# Patient Record
Sex: Female | Born: 1955 | Race: White | Hispanic: No | Marital: Single | Smoking: Former smoker
Health system: Southern US, Community
[De-identification: ages and names within clinical notes are randomized; demographics above are authoritative.]

## PROBLEM LIST (undated history)

## (undated) DIAGNOSIS — C801 Malignant (primary) neoplasm, unspecified: Secondary | ICD-10-CM

## (undated) DIAGNOSIS — Z923 Personal history of irradiation: Secondary | ICD-10-CM

## (undated) DIAGNOSIS — M810 Age-related osteoporosis without current pathological fracture: Secondary | ICD-10-CM

## (undated) DIAGNOSIS — F410 Panic disorder [episodic paroxysmal anxiety] without agoraphobia: Secondary | ICD-10-CM

## (undated) DIAGNOSIS — M1990 Unspecified osteoarthritis, unspecified site: Secondary | ICD-10-CM

## (undated) DIAGNOSIS — E7800 Pure hypercholesterolemia, unspecified: Secondary | ICD-10-CM

## (undated) HISTORY — DX: Age-related osteoporosis without current pathological fracture: M81.0

## (undated) HISTORY — PX: RHINOPLASTY: SUR1284

## (undated) HISTORY — DX: Panic disorder (episodic paroxysmal anxiety): F41.0

## (undated) HISTORY — PX: MANDIBLE FRACTURE SURGERY: SHX706

## (undated) HISTORY — DX: Pure hypercholesterolemia, unspecified: E78.00

## (undated) HISTORY — PX: MASTECTOMY: SHX3

---

## 2009-03-13 ENCOUNTER — Encounter (INDEPENDENT_AMBULATORY_CARE_PROVIDER_SITE_OTHER): Payer: Self-pay | Admitting: *Deleted

## 2009-04-24 ENCOUNTER — Ambulatory Visit: Payer: Self-pay | Admitting: Internal Medicine

## 2009-04-24 DIAGNOSIS — F411 Generalized anxiety disorder: Secondary | ICD-10-CM | POA: Insufficient documentation

## 2009-04-24 DIAGNOSIS — Z9889 Other specified postprocedural states: Secondary | ICD-10-CM | POA: Insufficient documentation

## 2009-04-24 DIAGNOSIS — N959 Unspecified menopausal and perimenopausal disorder: Secondary | ICD-10-CM | POA: Insufficient documentation

## 2009-04-24 DIAGNOSIS — R5383 Other fatigue: Secondary | ICD-10-CM

## 2009-04-24 DIAGNOSIS — R5381 Other malaise: Secondary | ICD-10-CM | POA: Insufficient documentation

## 2009-04-24 DIAGNOSIS — S02650A Fracture of angle of mandible, unspecified side, initial encounter for closed fracture: Secondary | ICD-10-CM | POA: Insufficient documentation

## 2009-04-24 DIAGNOSIS — J309 Allergic rhinitis, unspecified: Secondary | ICD-10-CM | POA: Insufficient documentation

## 2009-04-24 DIAGNOSIS — M2550 Pain in unspecified joint: Secondary | ICD-10-CM | POA: Insufficient documentation

## 2009-04-24 DIAGNOSIS — IMO0001 Reserved for inherently not codable concepts without codable children: Secondary | ICD-10-CM | POA: Insufficient documentation

## 2009-04-28 LAB — CONVERTED CEMR LAB: Vit D, 25-Hydroxy: 50 ng/mL (ref 30–89)

## 2009-05-01 ENCOUNTER — Encounter (INDEPENDENT_AMBULATORY_CARE_PROVIDER_SITE_OTHER): Payer: Self-pay | Admitting: *Deleted

## 2009-05-02 LAB — CONVERTED CEMR LAB
BUN: 16 mg/dL (ref 6–23)
Basophils Absolute: 0 10*3/uL (ref 0.0–0.1)
Basophils Relative: 0 % (ref 0.0–3.0)
CO2: 31 meq/L (ref 19–32)
Calcium: 9.4 mg/dL (ref 8.4–10.5)
Chloride: 106 meq/L (ref 96–112)
Creatinine, Ser: 1 mg/dL (ref 0.4–1.2)
Eosinophils Absolute: 0.1 10*3/uL (ref 0.0–0.7)
Eosinophils Relative: 3.1 % (ref 0.0–5.0)
Free T4: 0.7 ng/dL (ref 0.6–1.6)
GFR calc non Af Amer: 61.9 mL/min (ref >60)
Glucose, Bld: 85 mg/dL (ref 70–99)
HCT: 35.8 % — ABNORMAL LOW (ref 36.0–46.0)
Hemoglobin: 12.1 g/dL (ref 12.0–15.0)
Lymphocytes Relative: 25 % (ref 12.0–46.0)
Lymphs Abs: 1.1 10*3/uL (ref 0.7–4.0)
MCHC: 33.8 g/dL (ref 30.0–36.0)
MCV: 94.4 fL (ref 78.0–100.0)
Monocytes Absolute: 0.2 10*3/uL (ref 0.1–1.0)
Monocytes Relative: 5.4 % (ref 3.0–12.0)
Neutro Abs: 3 10*3/uL (ref 1.4–7.7)
Neutrophils Relative %: 66.5 % (ref 43.0–77.0)
Platelets: 211 10*3/uL (ref 150.0–400.0)
Potassium: 4.6 meq/L (ref 3.5–5.1)
RBC: 3.79 M/uL — ABNORMAL LOW (ref 3.87–5.11)
RDW: 12.4 % (ref 11.5–14.6)
Rhuematoid fact SerPl-aCnc: <20 intl units/mL (ref 0.0–20.0)
Sed Rate: 17 mm/hr (ref 0–22)
Sodium: 144 meq/L (ref 135–145)
TSH: 2.88 microintl units/mL (ref 0.35–5.50)
Total CK: 57 units/L (ref 7–177)
Uric Acid, Serum: 4.5 mg/dL (ref 2.4–7.0)
WBC: 4.4 10*3/uL — ABNORMAL LOW (ref 4.5–10.5)

## 2009-05-03 ENCOUNTER — Encounter (INDEPENDENT_AMBULATORY_CARE_PROVIDER_SITE_OTHER): Payer: Self-pay | Admitting: *Deleted

## 2009-05-03 ENCOUNTER — Telehealth (INDEPENDENT_AMBULATORY_CARE_PROVIDER_SITE_OTHER): Payer: Self-pay | Admitting: *Deleted

## 2009-05-08 ENCOUNTER — Encounter: Payer: Self-pay | Admitting: Internal Medicine

## 2009-05-08 ENCOUNTER — Ambulatory Visit (HOSPITAL_COMMUNITY): Admission: RE | Admit: 2009-05-08 | Discharge: 2009-05-08 | Payer: Self-pay | Admitting: Internal Medicine

## 2009-05-08 IMAGING — MG MM DIGITAL SCREENING BILAT
4 series · 4 of 4 positions shown · non-contrast
Comparison: none

DG SCREEN MAMMOGRAM BILATERAL
Bilateral CC and MLO view(s) were taken.
Technologist: KAREL-LUCIE, RT RM

DIGITAL SCREENING MAMMOGRAM WITH CAD
The breast tissue is extremely dense.  No masses or malignant type calcifications are identified.
Images were processed with CAD.

[R CC]
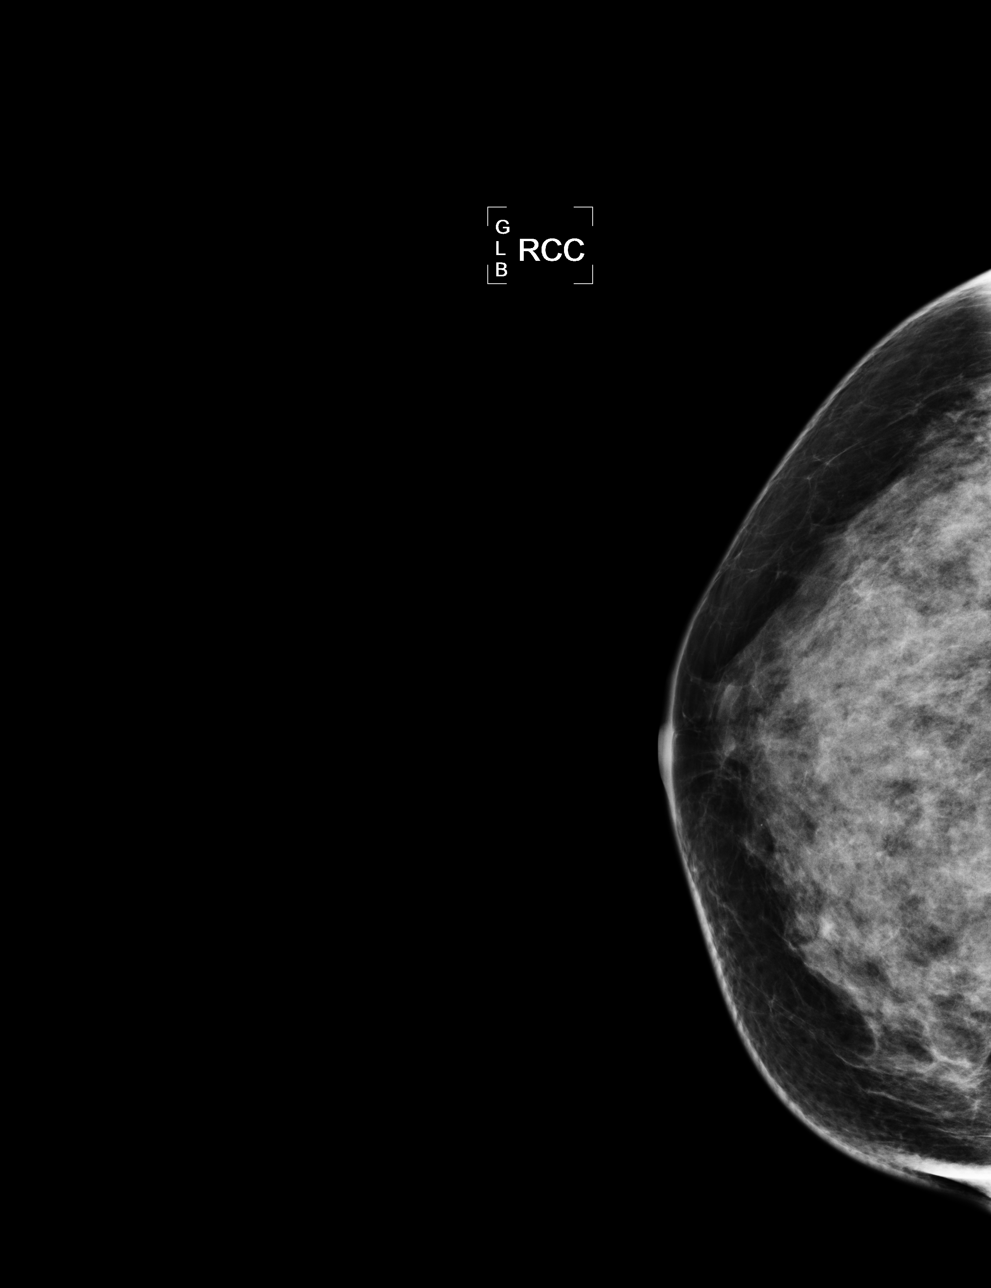

[R MLO]
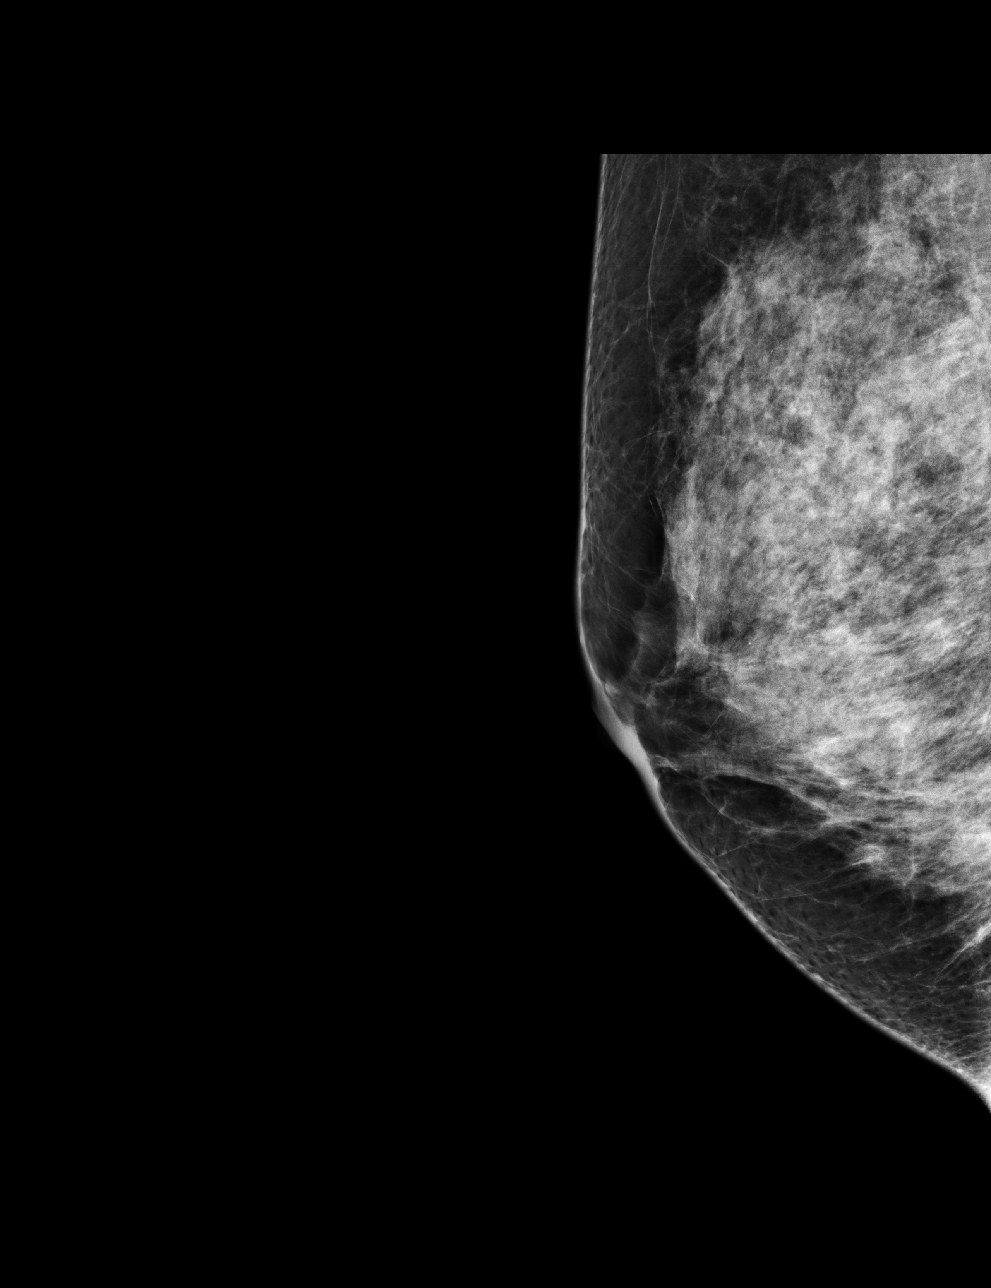

[L CC]
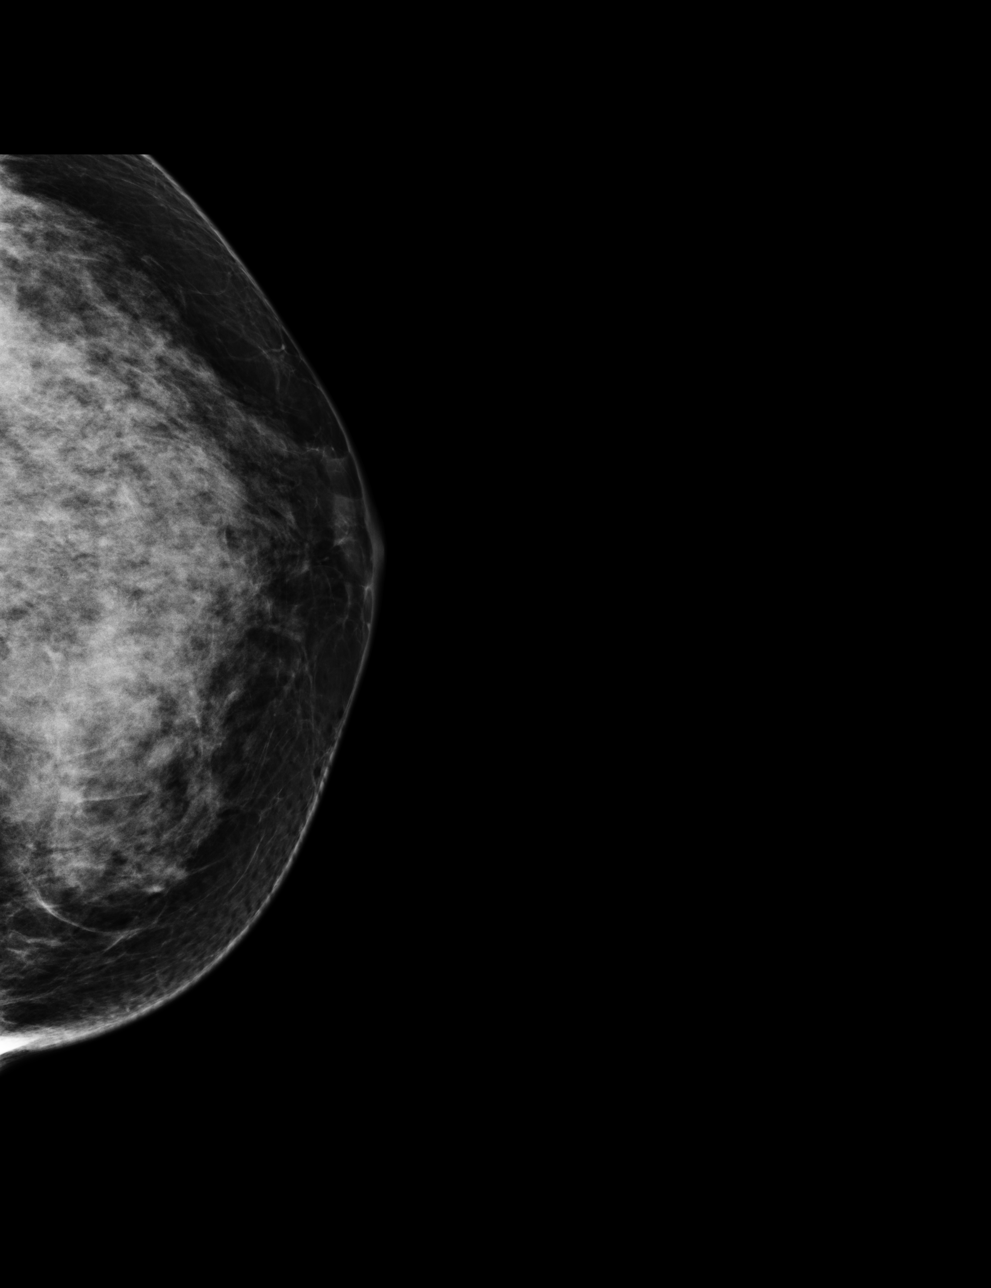

[L MLO]
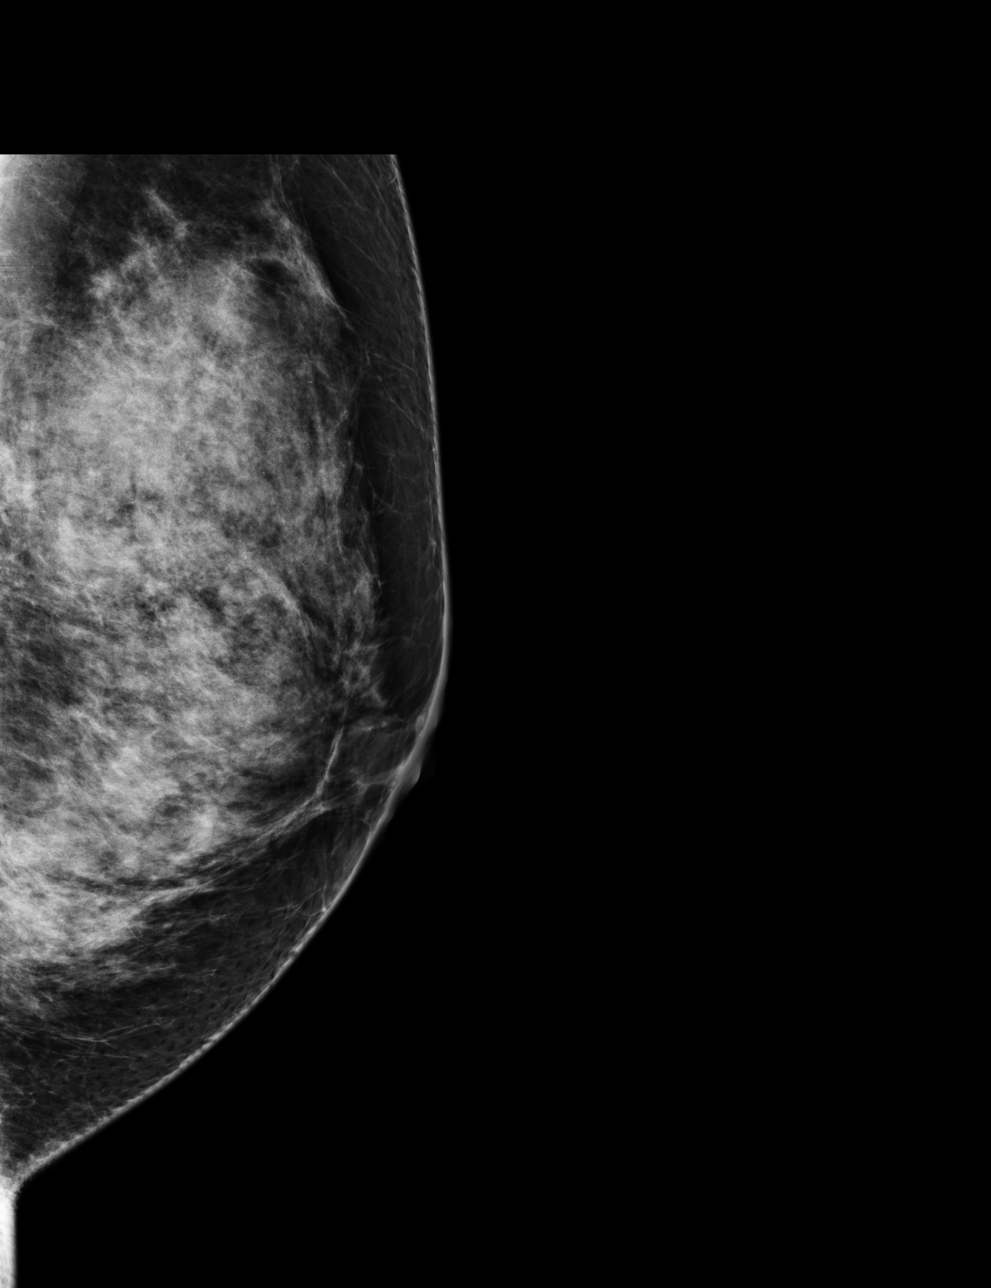

[4 of 4 positions shown; findings below may reference images not displayed]

IMPRESSION: No specific mammographic evidence of malignancy.  Next screening mammogram is recommended in one 
year.

A result letter of this screening mammogram will be mailed directly to the patient.

ASSESSMENT: Negative - BI-RADS 1

Screening mammogram in 1 year.
ANALYZED BY COMPUTER AIDED DETECTION. , THIS PROCEDURE WAS A DIGITAL MAMMOGRAM.

## 2009-05-14 ENCOUNTER — Ambulatory Visit: Payer: Self-pay | Admitting: Internal Medicine

## 2009-05-14 ENCOUNTER — Telehealth: Payer: Self-pay | Admitting: Gastroenterology

## 2009-05-14 ENCOUNTER — Telehealth (INDEPENDENT_AMBULATORY_CARE_PROVIDER_SITE_OTHER): Payer: Self-pay | Admitting: *Deleted

## 2009-05-14 DIAGNOSIS — K625 Hemorrhage of anus and rectum: Secondary | ICD-10-CM | POA: Insufficient documentation

## 2009-05-14 LAB — CONVERTED CEMR LAB
Basophils Absolute: 0.1 10*3/uL (ref 0.0–0.1)
Basophils Relative: 0.9 % (ref 0.0–3.0)
Eosinophils Absolute: 0.1 10*3/uL (ref 0.0–0.7)
Eosinophils Relative: 2.2 % (ref 0.0–5.0)
HCT: 38.7 % (ref 36.0–46.0)
Hemoglobin: 13.4 g/dL (ref 12.0–15.0)
Lymphocytes Relative: 23.6 % (ref 12.0–46.0)
Lymphs Abs: 1.4 10*3/uL (ref 0.7–4.0)
MCHC: 34.7 g/dL (ref 30.0–36.0)
MCV: 93 fL (ref 78.0–100.0)
Monocytes Absolute: 0.3 10*3/uL (ref 0.1–1.0)
Monocytes Relative: 5.7 % (ref 3.0–12.0)
Neutro Abs: 4.2 10*3/uL (ref 1.4–7.7)
Neutrophils Relative %: 67.6 % (ref 43.0–77.0)
Platelets: 217 10*3/uL (ref 150.0–400.0)
RBC: 4.15 M/uL (ref 3.87–5.11)
RDW: 12.2 % (ref 11.5–14.6)
WBC: 6.1 10*3/uL (ref 4.5–10.5)

## 2009-05-15 ENCOUNTER — Telehealth: Payer: Self-pay | Admitting: Internal Medicine

## 2009-05-21 ENCOUNTER — Encounter: Payer: Self-pay | Admitting: Gastroenterology

## 2009-05-28 ENCOUNTER — Telehealth: Payer: Self-pay | Admitting: Physician Assistant

## 2009-05-29 ENCOUNTER — Ambulatory Visit: Payer: Self-pay | Admitting: Gastroenterology

## 2010-10-17 ENCOUNTER — Ambulatory Visit
Admission: RE | Admit: 2010-10-17 | Discharge: 2010-10-17 | Payer: Self-pay | Source: Home / Self Care | Attending: Internal Medicine | Admitting: Internal Medicine

## 2010-10-17 DIAGNOSIS — J019 Acute sinusitis, unspecified: Secondary | ICD-10-CM | POA: Insufficient documentation

## 2010-10-17 LAB — CONVERTED CEMR LAB: Rapid Strep: NEGATIVE

## 2010-11-21 NOTE — Assessment & Plan Note (Signed)
Summary: FOR SORE THROAT//PH   Vital Signs:  Patient profile:   55 year old female Weight:      139.2 pounds BMI:     22.04 Temp:     98.4 degrees F oral Pulse rate:   76 / minute Resp:     15 per minute BP sitting:   116 / 78  (left arm) Cuff size:   regular  Vitals Entered By: Shonna Chock CMA (October 17, 2010 9:07 AM) CC: Sore throat since Tuesday, URI symptoms   Primary Care Provider:  Marga Melnick, MD  CC:  Sore throat since Tuesday and URI symptoms.  History of Present Illness:      This is a 55 year old woman who presents with RTI  symptoms; onset 12/24 as scratchy throat.  The patient  now reports nasal congestion, scant purulent nasal discharge, sore throat, and productive cough, but denies earache.  The patient denies fever, dyspnea, and wheezing.  The patient also reports sneezing.  The patient denies itchy watery eyes and headache.  She has chronic  tooth pain, / from clenching teeth.  The patient denies the following risk factors for Strep sinusitis: unilateral facial pain and tender adenopathy.  Rx: Alka Seltzer Plus, Chloraseptic , Theraflu.  Current Medications (verified): 1)  Xanax 0.5 Mg Tabs (Alprazolam) .... 1/2-1 By Mouth Two Times A Day As Needed 2)  Flexeril 10 Mg Tabs (Cyclobenzaprine Hcl) .Marland Kitchen.. 1 By Mouth As Needed 3)  Multivitamins  Tabs (Multiple Vitamin) .... 2 By Mouth Once Daily 4)  Calcium Chew .... 1 By Mouth Three Times A Day 5)  Vit D .... 2 By Mouth Once Daily 6)  Glucosamine-Liquid .... Once Daily As Needed 7)  Benadryl 50 Mg/ml Soln (Diphenhydramine Hcl) .... As Needed 8)  Omega-3 Cf 1000 Mg Caps (Omega-3 Fatty Acids) .Marland Kitchen.. 1 By Mouth Once Daily  Allergies: 1)  ! Iodine  Physical Exam  General:  Thin,in no acute distress; alert,appropriate and cooperative throughout examination Ears:  External ear exam shows no significant lesions or deformities.  Otoscopic examination reveals clear canals, tympanic membranes are intact bilaterally  without bulging, retraction, inflammation or discharge. Hearing is grossly normal bilaterally. Nose:  External nasal examination shows no deformity or inflammation. Nasal mucosa are  dry without lesions or exudates. Profound hyponasal speech Mouth:  Oral mucosa and oropharynx without lesions or exudates.  Teeth in good repair. Mild pharyngeal erythema.  Very hoares Lungs:  Normal respiratory effort, chest expands symmetrically. Lungs are clear to auscultation, no crackles or wheezes. Decreased BS w/o increased WOB Heart:  Normal rate and regular rhythm. S1 and S2 normal without gallop, murmur, click, rub .S4 with slurring Cervical Nodes:  No lymphadenopathy noted Axillary Nodes:  No palpable lymphadenopathy   Impression & Recommendations:  Problem # 1:  PHARYNGITIS-ACUTE (ICD-462)  Her updated medication list for this problem includes:    Amoxicillin 250 Mg/29ml Susr (Amoxicillin) .Marland KitchenMarland KitchenMarland KitchenMarland Kitchen 10 cc three times a day  Problem # 2:  BRONCHITIS-ACUTE (ICD-466.0)  Her updated medication list for this problem includes:    Amoxicillin 250 Mg/8ml Susr (Amoxicillin) .Marland KitchenMarland KitchenMarland KitchenMarland Kitchen 10 cc three times a day  Problem # 3:  SINUSITIS- ACUTE-NOS (ICD-461.9)  Her updated medication list for this problem includes:    Amoxicillin 250 Mg/58ml Susr (Amoxicillin) .Marland KitchenMarland KitchenMarland KitchenMarland Kitchen 10 cc three times a day  Complete Medication List: 1)  Xanax 0.5 Mg Tabs (Alprazolam) .... 1/2-1 by mouth two times a day as needed 2)  Flexeril 10 Mg Tabs (Cyclobenzaprine hcl) .Marland KitchenMarland KitchenMarland Kitchen  1 by mouth as needed 3)  Multivitamins Tabs (Multiple vitamin) .... 2 by mouth once daily 4)  Calcium Chew  .... 1 by mouth three times a day 5)  Vit D  .... 2 by mouth once daily 6)  Glucosamine-liquid  .... Once daily as needed 7)  Benadryl 50 Mg/ml Soln (Diphenhydramine hcl) .... As needed 8)  Omega-3 Cf 1000 Mg Caps (Omega-3 fatty acids) .Marland Kitchen.. 1 by mouth once daily 9)  Amoxicillin 250 Mg/54ml Susr (Amoxicillin) .Marland Kitchen.. 10 cc three times a day  Other Orders: Rapid Strep  (54098)  Patient Instructions: 1)  Neti pot or Rinse two times a day until  sinuses are clear.Plain Mucinex as needed for thick secretions. 2)  Drink as much NON dairy fluid as you can tolerate for the next few days. Prescriptions: AMOXICILLIN 250 MG/5ML SUSR (AMOXICILLIN) 10 cc three times a day  #300 cc x 0   Entered and Authorized by:   Marga Melnick MD   Signed by:   Marga Melnick MD on 10/17/2010   Method used:   Faxed to ...       [facility] [address] (retail)       [phone]       [place], [place]  [address]       Ph: [phone]       Fax: [phone]   RxID:   [id number]    Orders Added: 1)  Rapid Strep [44034] 2)  Est. Patient Level III [74259]    Laboratory Results    Other Tests  Rapid Strep: negative

## 2010-11-21 NOTE — Progress Notes (Signed)
Summary: Heavy GI Bleed  Phone Note From Other Clinic   Caller: RENEE [phone] Dr Alwyn Ren Call For: ANY GI DR Reason for Call: Schedule Patient Appt Summary of Call: Wants pt worked in for heavy Gi bleed. Will be new no gi hx per [facility] Initial call taken by: Leanor Kail [facility],  May 14, 2009 11:15 AM  Follow-up for Phone Call        Work in discussed with Dr.Kaplan.He will accept pt. To be put on with P.A. and he will  discuus with Amy after she is seen.Renee  at Dr.Hopper's office ntfd. to have pt. come at 2:15p.m. today so she can complete paperwork. Follow-up by: Teryl Lucy RN,  May 14, 2009 11:48 AM

## 2010-11-21 NOTE — Progress Notes (Signed)
Summary: ? about procedure tomorrow  Phone Note Call from Patient Call back at Home Phone [phone]   Caller: Patient Call For: Mike Gip Reason for Call: Talk to Nurse Details for Reason: ? about procedure tomorrow Summary of Call: Has questions about procedure tomorrow Initial call taken by: Darryl Lent CMA Duncan Dull),  May 28, 2009 1:07 PM  Follow-up for Phone Call        Pt wanted to ask about taking the dulcolax which she did this afternoon. She will be drinking her Prep at 5:00 this evening.  She is already having loose stools.  I told her that is normal and drinking this prep will purge her colon.  I explained what they will be doing during her colonoscopy and she said I made her feel alot better.   Follow-up by: Joselyn Glassman,  May 28, 2009 4:20 PM

## 2010-11-21 NOTE — Miscellaneous (Signed)
Summary: Orders Update  Clinical Lists Changes  Problems: Added new problem of SCREENING MAMMOGRAM NEC (ICD-V76.12) Orders: Added new Referral order of Radiology Referral (Radiology) - Signed

## 2010-11-21 NOTE — Progress Notes (Signed)
Summary: **Recent Labs**  Phone Note Outgoing Call Call back at Home Phone [phone]   Call placed by: Shonna Chock,  May 03, 2009 8:15 AM Call placed to: Patient Summary of Call: Left message on machine for patient to return call when avaliable, Reason for call:   Very good reports except for mild anemia. Please recheck CBC with iron panel & B12/folate levels(285.9) to rule out progressive anemia. Please complete stool cards also  [facility]  May 03, 2009 8:17 AM   Follow-up for Phone Call        SPOKE WITH PATIENT, PATIENT SAID SHE JUST STARTED TAKING MVI WITH IRON (two times a day ) PROBABLY A DAY PRIOR TO LABS OR THE DAY AFTER. PATIENT SAID SLIGHTLY LOW IS COMMON FOR HER AND WOULD LIKE TO KNOW IF ITS NECESSARY TO DO ADDITIONAL LABS BASED ON INFORMATION GIVEN.  DR.HOPPER PLEASE GIVE YOUR EXPERT ADVICE Follow-up by: Shonna Chock,  May 03, 2009 2:11 PM  Additional Follow-up for Phone Call Additional follow up Details #1::        The Standard of Care is to rule out in GI blood loss; simply taking iron could mask such a process. To be be cautious these labs & stool cards are indicated. Please review the actual  lab reports & decide Additional Follow-up by: Marga Melnick MD,  May 04, 2009 6:48 AM    Additional Follow-up for Phone Call Additional follow up Details #2::    D/W PATIENT, SHE HAS STOOL CARDS AT HOME TO DO, SCHEDULE APPOINTMENT TO RECHECK NEXT TUESDAY Follow-up by: Shonna Chock,  May 10, 2009 1:54 PM

## 2010-11-21 NOTE — Assessment & Plan Note (Signed)
Summary: Rectal bldg.   History of Present Illness Visit Type: Initial Consult Primary GI MD: Lina Sar MD Primary Provider: Marga Melnick, MD Chief Complaint: rectal bleeding bright red in color History of Present Illness:   55 YO FEMALE NEW TO GI TODAY,REFERRED WITH NEW ONSET RECTAL BLEEDING. SHE HAS NO PRIOR GI HX,HAS HAD RARE HEMOORHOID TYPE SXS IN THE PAST. SHE HAD ONSET SATURDAY 7/24 WITH URGE FOR BM WHILE DRIVING TO [place]. SHE WENT TO THE BR AND PASSED A "COMMODE FULL OF BLOOD". SHE WAS QUITE ANXIOUS AFTER THAT ,BUT FELT OK,SO WENT ON TO [place]. LATER IN THE DAY SHE HAD 3-4 MORE EPISODES OF BM/LOOSE STOOL WITH RED BLOOD BUT LESSER AMTS,AND WITH CLOTS. SHE HAD ONE STOOL YESTERDAY WITH MINIMAL BLOOD AND DOESNT REMEMBER IF SHE HAD A BM THIS AM .NOREGULAR ASA OR NSAIDS. NO PRIOR COLONOSCOPY.  DENIES ABDOMINAL PAIN ,CRAMPING,RECTAL DISCOMFORT.   GI Review of Systems      Denies abdominal pain, acid reflux, belching, bloating, chest pain, dysphagia with liquids, dysphagia with solids, heartburn, loss of appetite, nausea, vomiting, vomiting blood, and  weight loss.      Reports rectal bleeding.     Denies anal fissure, black tarry stools, change in bowel habit, constipation, diarrhea, diverticulosis, fecal incontinence, heme positive stool, hemorrhoids, irritable bowel syndrome, jaundice, light color stool, liver problems, and  rectal pain. Preventive Screening-Counseling & Management      Drug Use:  no.      Current Medications (verified): 1)  Xanax 0.5 Mg Tabs (Alprazolam) .... 1/2-1 By Mouth Two Times A Day As Needed 2)  Flexeril 10 Mg Tabs (Cyclobenzaprine Hcl) .Marland Kitchen.. 1 By Mouth As Needed 3)  Multivitamins  Tabs (Multiple Vitamin) .... 2 By Mouth Once Daily 4)  Calcium Chew .... 1 By Mouth Three Times A Day 5)  Vit D .... 2 By Mouth Once Daily 6)  Glucosamine-Liquid .... Once Daily As Needed 7)  Benadryl 50 Mg/ml Soln (Diphenhydramine Hcl) .... As Needed  Allergies  (verified): 1)  ! Iodine  Past History:  Past Medical History: Allergic rhinitis Anxiety/ Panic Disorder Anemia Arthritis Urinary Tract Infection  Past Surgical History: Reviewed history from 04/24/2009 and no changes required. FRACTURE, JAW, ANGLE (ICD-802.25) RHINOPLASTY, HX OF (ICD-V45.89); G 0 P 0  Family History: Reviewed history from 04/24/2009 and no changes required. ? 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  The patient denies arthritis/joint pain, blood in urine, breast changes/lumps, change in vision, confusion, cough, coughing up blood, depression-new, fainting, fever, headaches-new, heart rhythm changes, itching, menstrual pain, nosebleeds, pregnancy symptoms, shortness of breath, skin rash, sore throat, swelling of feet/legs, swollen lymph glands, thirst - excessive , urination - excessive , urination changes/pain, urine leakage, and voice change.    Vital Signs:  Patient profile:   55 year old female Height:      66.75 inches Weight:      136.13 pounds BMI:     21.56 Pulse rate:   68 / minute Pulse rhythm:   regular BP sitting:   102 / 60  (left arm)  Vitals Entered By: Milford Cage NCMA (May 14, 2009 2:17  PM)  Physical Exam  General:  Well developed, well nourished, no acute distress.,THIN Head:  Normocephalic and atraumatic. Eyes:  PERRLA, no icterus. Neck:  Supple; no masses or thyromegaly. Lungs:  Clear throughout to auscultation. Heart:  Regular rate and rhythm; no murmurs, rubs,  or bruits. Abdomen:  SOFT, NONTENDER, NO MASS OR  HSM,BS+ Rectal:  NO EXTERNAL LESION,SMALL HEMORRHOID TAG, NO STOOL IN RECTAL VAULT MUCOUS HEME NEGATIVE Msk:  Symmetrical with no gross deformities. Normal posture. Neurologic:  Alert and  oriented x4;  grossly normal neurologically. Psych:  anxious.     Impression & Recommendations:  Problem # 1:  RECTAL BLEEDING (ICD-569.3) Assessment New 55 YO FEMALE WITH ACUTE EPISODE OF SELF LIMITED RECTAL BLEEDING 05/12/09. ETIOLOGY NOT CLEAR.R/O DIVERTICULAR,R/O OCCULT LESION. CBC TODAY SCHEDULE FOR COLONOSCOPY WITH DR Juanda Chance WITHIN THE NEXT WEEK. PROCEDURE DISCUSSED IN DETAIL WITH THE PATIENT PT ADVISED TO CALL IN THE INTERIM IF SHE HAS ANY FURTHER BLEEDING. Orders: TLB-CBC Platelet - w/Differential ([id number]) ZCOL (ZCOL)  Patient Instructions: 1)  Please go to the lab. 2)  We have scheduled the colonoscopy at [facility] with Dr. Lina Sar for 05-22-09 at 8:30Am.  3)  Colonoscopy and conscious sedation  brochure provided. 4)  Copy sent to : Dr Lacretia Nicks.Hopper Prescriptions: DULCOLAX 5 MG  TBEC (BISACODYL) Day before procedure take 2 at 3pm and 2 at 8pm.  #4 x 0   Entered by:   Lowry Ram NCMA   Authorized by:   Sammuel Cooper PA-c   Signed by:   Lowry Ram NCMA on 05/14/2009   Method used:   Electronically to        [facility] [address] (retail)       [address]       [place], [place]  [address]       Ph: [phone]       Fax: [phone]   RxID:   [id number] METOCLOPRAMIDE HCL 10 MG  TABS (METOCLOPRAMIDE HCL) As per prep instructions.  #2 x 0   Entered by:   Lowry Ram NCMA   Authorized by:   Sammuel Cooper PA-c   Signed by:   Lowry Ram NCMA on 05/14/2009   Method used:   Electronically to        [facility] [address] (retail)       [address]       [place], [place]  [address]       Ph: [phone]       Fax: [phone]   RxID:   [id number] MIRALAX   POWD  (POLYETHYLENE GLYCOL 3350) As per prep  instructions.  #255gm x 0   Entered by:   Lowry Ram NCMA   Authorized by:   Sammuel Cooper PA-c   Signed by:   Lowry Ram NCMA on 05/14/2009   Method used:   Electronically to        [facility] [address] (retail)       [address]       [place], [place]  [address]       Ph: [phone]       Fax: [phone]   RxID:   [id number]

## 2010-11-21 NOTE — Letter (Signed)
Summary: New Patient Letter  [facility] [place]  [address] [place], [place] [address]   Phone: [phone]  Fax: [phone]       03/13/2009 MRN: [id number]  Kely Kirst [address] [place], [place]  [address]  Dear Ms. Irma Newness,   Welcome to [facility] and thank you for choosing [place] as your Primary Care Providers. Enclosed you will find information about our practice that we hope you find helpful. We have also enclosed forms to be filled out prior to your visit. This will provide [place] with the necessary information and facilitate your being seen in a timely manner. If you have any questions, please call us at:  [phone] and we will be happy to assist you. We look forward to seeing you at your scheduled appointment time.  Appointment   04-24-09 @ 1:00      with Dr.    Marga Melnick       Sincerely,  Primary Health Care Team  Please arrive 15 minutes early for your first appointment and bring your insurance card. Co-pay is required at the time of your visit.  *****Please call the office if you are not able to keep this appointment. There is a charge of $50.00 if any appointment is not cancelled or rescheduled within 24 hours.

## 2010-11-21 NOTE — Letter (Signed)
Summary: Results Follow up Letter  [facility]  [address] [place], [place] [address]   Phone: [phone]  Fax: [phone]    05/01/2009 MRN: [id number]  Sydney Moody [address] [place], [place]  [address]  Dear Ms. Metheney,  The following are the results of your recent test(s):  Test         Result    Pap Smear:        Normal _____  Not Normal _____ Comments: ______________________________________________________ Cholesterol: LDL(Bad cholesterol):         Your goal is less than:         HDL (Good cholesterol):       Your goal is more than: Comments:  ______________________________________________________ Mammogram:        Normal _____  Not Normal _____ Comments:  ___________________________________________________________________ Hemoccult:        Normal _____  Not normal _______ Comments:    _____________________________________________________________________ Other Tests: PLEASE SEE ATTACHED LABS DONE ON 04/24/2009    We routinely do not discuss normal results over the telephone.  If you desire a copy of the results, or you have any questions about this information we can discuss them at your next office visit.   Sincerely,

## 2010-11-21 NOTE — Progress Notes (Signed)
Summary: TRIAGE CALL: RECTAL BLEEDING  Phone Note Call from Patient Call back at Home Phone [phone]   Caller: Patient Summary of Call: MESSAGE LEFT ON VOICEMAIL: PATIENT WOULD LIKE TO KNOW IF SHE SHOULD COMPLETE STOOL CARDS. PATIENT SAID OVER THE WEEKEND SHE NOTICED A LOT OF BLOOD WITH BM. PATIENT THINKS SHE NEEDS COLONOSCOPY   PER DR.HOPPER PATIENT NEEDS TO BE REFERRED TO GI FOR EVALUATION AND THEY WILL THEN RECOMMEND COLONOSCOPY  LEFT MESSAGE ON VOICEMAIL INFORMING PATIENT OF DR.HOPPER'S RECOMMENDATIONS, PATIENT TO CALL IF ANY FUTHER QUESTIONS OR CONCERNS.Sydney Moody  May 14, 2009 11:00 AM

## 2010-11-21 NOTE — Letter (Signed)
Summary: Results Follow up Letter  [facility]  [address] [place], [place] [address]   Phone: [phone]  Fax: [phone]    05/03/2009 MRN: [id number]  Sydney Moody [address] [place], [place]  [address]  Dear [name],  The following are the results of your recent test(s):  Test         Result    Pap Smear:        Normal _____  Not Normal _____ Comments: ______________________________________________________ Cholesterol: LDL(Bad cholesterol):         Your goal is less than:         HDL (Good cholesterol):       Your goal is more than: Comments:  ______________________________________________________ Mammogram:        Normal _____  Not Normal _____ Comments:  ___________________________________________________________________ Hemoccult:        Normal _____  Not normal _______ Comments:    _____________________________________________________________________ Other Tests: PLEASE SEE ATTACHED LABS DONE ON 04/24/2009    We routinely do not discuss normal results over the telephone.  If you desire a copy of the results, or you have any questions about this information we can discuss them at your next office visit.   Sincerely,

## 2010-11-21 NOTE — Miscellaneous (Signed)
Summary: [facility] COLON 08/10/210  Clinical Lists Changes  Orders: Added new Test order of Colonoscopy (Colon) - Signed

## 2010-11-21 NOTE — Assessment & Plan Note (Signed)
Summary: new to est/cbs   Vital Signs:  Patient profile:   55 year old female Height:      66.75 inches Weight:      138.2 pounds BMI:     21.89 Temp:     98.2 degrees F oral Pulse rate:   72 / minute Resp:     14 per minute BP sitting:   108 / 60  (left arm) Cuff size:   regular  Vitals Entered By: Shonna Chock (April 24, 2009 1:31 PM) CC: NEW PATIENT ESTABLISH: BODY ACHES, FATIGUE Comments REVIEWED MED LIST, PATIENT AGREED DOSE AND INSTRUCTION CORRECT    CC:  NEW PATIENT ESTABLISH: BODY ACHES and FATIGUE.  History of Present Illness: Here to re-establish ; she left [place] in 2000 or 2002. She has intermittent soreness in neck, shoulders & lower back. Symptoms occur with minor physical activity, but not with her regular exercise  (walking & hand weights & Marcelina Morel). On Xanax as needed & Flexeril as needed . PMH of ? neck arthritis ; adopted.  Preventive Screening-Counseling & Management  Alcohol-Tobacco     Smoking Status: quit  Caffeine-Diet-Exercise     Does Patient Exercise: yes  Allergies (verified): 1)  ! Iodine  Past History:  Past Medical History: Allergic rhinitis Anxiety/ Panic Disorder  Past Surgical History: FRACTURE, JAW, ANGLE (ICD-802.25) RHINOPLASTY, HX OF (ICD-V45.89); G 0 P 0  Family History: ? 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 Eyes:  Denies double vision and vision loss-both eyes; Some vision loss. ENT:  Complains of nasal congestion and sinus pressure; denies difficulty swallowing and hoarseness. CV:  Freq PVCs; evaluated in detail. Resp:  Denies cough, shortness of  breath, and sputum productive. GI:  Denies bloody stools, change in bowel habits, constipation, dark tarry stools, diarrhea, and indigestion; No colonoscopy ; "I'm a chicken" (SOC). GU:  Denies discharge, dysuria, and hematuria; Nocturia X 1-2. MS:  See HPI; Complains of joint pain; denies joint redness, joint swelling, muscle aches, and muscle weakness. Derm:  Denies changes in nail beds and dryness; Hair thinning anteriorly. Neuro:  Complains of numbness and tingling; N&T in extremities, positional. Psych:  Complains of anxiety; denies depression, easily angered, easily tearful, and irritability. Endo:  Denies cold intolerance, excessive hunger, excessive thirst, excessive urination, and heat intolerance. Heme:  Denies abnormal bruising and bleeding. Allergy:  Denies itching eyes and sneezing.  Physical Exam  General:  Thin,in no acute distress; alert,appropriate and cooperative throughout examination Head:  Normocephalic and atraumatic without obvious abnormalities. No apparent alopecia but hair fine Eyes:  No corneal or conjunctival inflammation noted.Perrla. No significant lid lag Neck:  No deformities, masses, or tenderness noted.Thyroid full w/o nodules Lungs:  Normal respiratory effort, chest expands symmetrically. Lungs are clear to auscultation, no crackles or wheezes. Heart:  normal rate, regular rhythm, no gallop, no rub, no JVD, no HJR, and grade 1 /6 systolic murmur.   Abdomen:  Bowel sounds positive,abdomen soft and non-tender without masses, organomegaly or hernias noted. Msk:  No deformity or scoliosis noted of thoracic or lumbar spine.   Pulses:  R and L carotid,radial,dorsalis pedis and posterior tibial pulses are full and equal bilaterally Extremities:  No clubbing, cyanosis, edema, or deformity noted with normal full range of motion of all joints.   Neurologic:  alert & oriented X3, strength normal in all extremities, and DTRs symmetrical and normal.   Skin:  Intact  without suspicious lesions or rashes Cervical Nodes:  No lymphadenopathy noted Psych:  memory intact for recent and remote, normally interactive, good eye contact, not anxious appearing, and not depressed appearing.     Impression & Recommendations:  Problem # 1:  FATIGUE (ICD-780.79)  Orders: Venipuncture (16109) TLB-BMP (Basic Metabolic Panel-BMET) (80048-METABOL) TLB-CBC Platelet - w/Differential (85025-CBCD) TLB-TSH (Thyroid Stimulating Hormone) (84443-TSH) TLB-T4 (Thyrox), Free [phone])  Problem # 2:  ARTHRALGIA (ICD-719.40)  Orders: Venipuncture (19147) TLB-Uric Acid, Blood (84550-URIC) TLB-Rheumatoid Factor (RA) (82956-OZ) TLB-Sedimentation Rate (ESR) (85652-ESR) T-Vitamin D (25-Hydroxy) (30865-78469)  Problem # 3:  MUSCLE PAIN (ICD-729.1)  Her updated medication list for this problem includes:    Flexeril 10 Mg Tabs (Cyclobenzaprine hcl) .Marland Kitchen... 1 by mouth as needed  Orders: Venipuncture (62952) TLB-CK Total Only(Creatine Kinase/CPK) (82550-CK) T-Vitamin D (25-Hydroxy) (84132-44010)  Problem # 4:  ANXIETY (ICD-300.00)  Her updated medication list for this problem includes:    Xanax 0.5 Mg Tabs (Alprazolam) .Marland Kitchen... 1/2-1 by mouth two times a day as needed  Problem # 5:  POSTMENOPAUSAL SYNDROME (ICD-627.9)  Orders: T-Vitamin D (25-Hydroxy) (27253-66440) Radiology Referral (Radiology)  Complete Medication List: 1)  Xanax 0.5 Mg Tabs (Alprazolam) .... 1/2-1 by mouth two times a day as needed 2)  Flexeril 10 Mg Tabs (Cyclobenzaprine hcl) .Marland Kitchen.. 1 by mouth as needed 3)  Multivitamins Tabs (Multiple vitamin) .... 2 by mouth once daily 4)  Calcium Chew  .... 1 by mouth three times a day 5)  Vit D  .... 2 by mouth once daily 6)  Glucosamine-liquid  .... Once daily as needed 7)  Benadryl 50 Mg/ml Soln (Diphenhydramine hcl) .... As needed 8)  Flonase 50 Mcg/act Susp (Fluticasone propionate) .... As needed  Patient Instructions: 1)  Consider colonoscopy as per [facility];  complete stool cards. Glucosamine sulfate 1500 mg once daily . Neti pot once daily as needed for head congestion

## 2010-11-21 NOTE — Procedures (Signed)
Summary: Colonoscopy   Colonoscopy  Procedure date:  05/29/2009  Findings:      Location:  [facility].   OPERATIVE PROCEDURE REPORT  PATIENT:  Sydney Moody, Sydney Moody  MR#:  [id number]   ACCT: [id number] BIRTHDATE:   Jul 16, 1956, 53 yrs. old   GENDER:   female  ENDOSCOPIST:   Barbette Hair. Arlyce Dice, MD ASSISTANT:    PROCEDURE DATE:  05/29/2009 PROCEDURE:  Colonoscopy, diagnostic ASA CLASS:   Class I INDICATIONS: 1) rectal bleeding   MEDICATIONS:   Fentanyl 75 mcg IV, Versed 7 mg IV, Benadryl 25 mg IV  DESCRIPTION OF PROCEDURE:   After the risks benefits and alternatives of the procedure were thoroughly explained, informed consent was obtained.  Digital rectal exam was performed and revealed no abnormalities.   The LB [id number] endoscope was introduced through the anus and advanced to the cecum, which was identified by both the appendix and ileocecal valve, without limitations.  The quality of the prep was excellent, using MoviPrep.  The instrument was then slowly withdrawn as the colon was fully examined. <<PROCEDUREIMAGES>>            <<OLD IMAGES>>  FINDINGS:  Internal hemorrhoids were found (see image9).  This was otherwise a normal examination of the colon (see image1, image2, image3, image5, image7, and image8).   Retroflexed views in the rectum revealed no abnormalities.    The scope was then withdrawn from the patient and the procedure terminated.  COMPLICATIONS:   None  ENDOSCOPIC IMPRESSION:  1) Internal hemorrhoids  2) Otherwise normal examination  Rectal Bleeding secondary to hemorrhoids  RECOMMENDATIONS:  1) Anusol HC supp. prn  REPEAT EXAM:   No  cc: Dr. Illene Regulus cc: Dr. Lina Sar   _______________________________ Barbette Hair. Arlyce Dice, MD   CC:

## 2010-11-21 NOTE — Progress Notes (Signed)
Summary: CX PROC  Phone Note Call from Patient Call back at Home Phone [phone]   Caller: Patient Call For: BRODIE Summary of Call: PATIENT WANTS TO CX PROCEDURE SCHEDULED AT [facility] FOR 8-3 WITH DR Juanda Chance Initial call taken by: Tawni Levy,  May 15, 2009 2:00 PM  Follow-up for Phone Call        Pt had to cancel appt for [facility] colon with Dr. Juanda Chance for 05-22-09.  The party that was going to bring her cannot on that day.  Pt is going to call me week of 8-9. That is when the person that she wants to bring her will be home from vacation and she will be able to pick a new date. Follow-up by: Joselyn Glassman,  May 15, 2009 4:26 PM     Appended Document: CX PROC Pt called to let me know she got her ride for the colonoscopy.  She reschedueld with Dr. Arlyce Dice on 05-29-09 in the [facility] at 3:30PM.  Micah Flesher over prep instructions and times. Dr Arlyce Dice had previously accepted the pt .

## 2011-02-11 ENCOUNTER — Other Ambulatory Visit: Payer: Self-pay | Admitting: Family Medicine

## 2011-02-11 DIAGNOSIS — E049 Nontoxic goiter, unspecified: Secondary | ICD-10-CM

## 2011-02-17 ENCOUNTER — Ambulatory Visit
Admission: RE | Admit: 2011-02-17 | Discharge: 2011-02-17 | Disposition: A | Discharge status: Home / Self Care | Payer: 59 | Source: Ambulatory Visit | Attending: Family Medicine | Admitting: Family Medicine

## 2011-02-17 DIAGNOSIS — E049 Nontoxic goiter, unspecified: Secondary | ICD-10-CM

## 2011-02-17 IMAGING — US US SOFT TISSUE HEAD/NECK
1 series · 14 of 25 positions shown · non-contrast
Comparison: None.

CLINICAL DATA: Thyroid goiter

THYROID ULTRASOUND
TECHNIQUE: Ultrasound examination of the thyroid gland and adjacent
soft tissues was performed.

[Series 1: us soft tissue head/neck · 0.05mm/px · 14 of 47 slices shown]
[im 1/47]
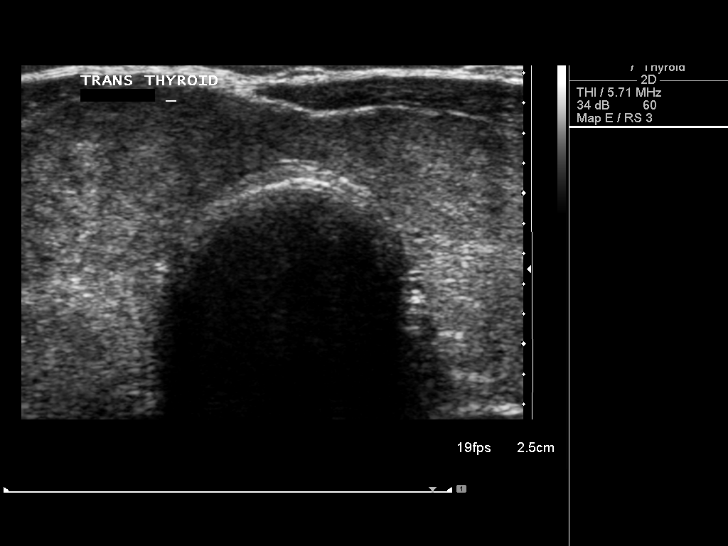
[im 4/47]
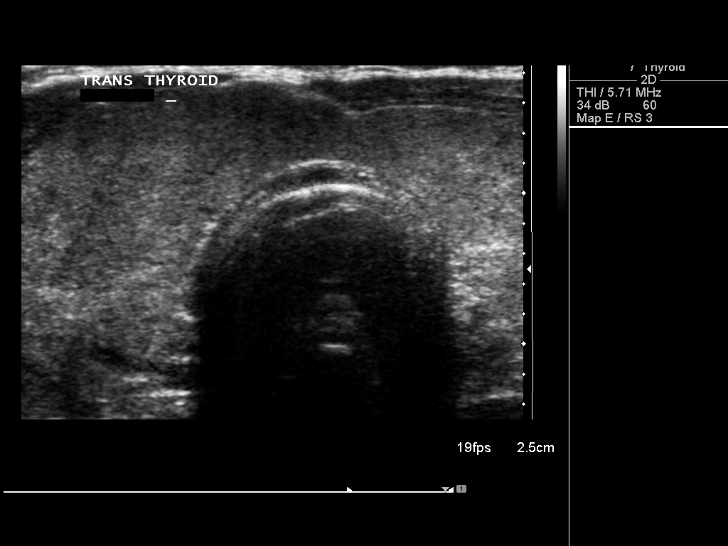
[im 8/47]
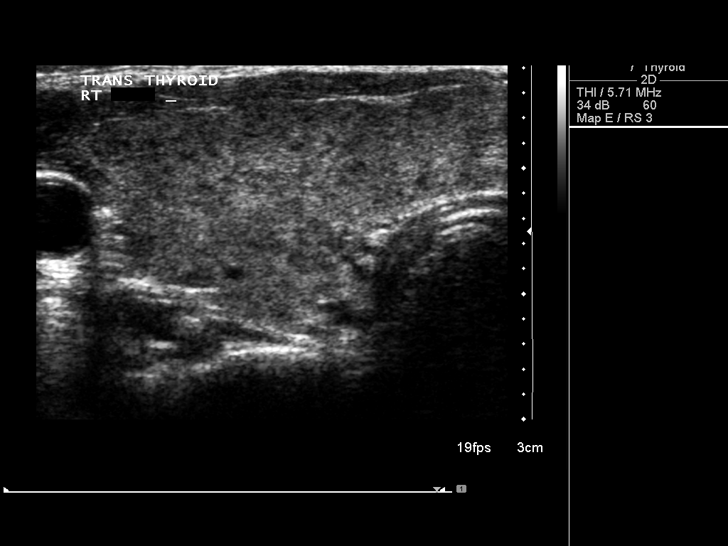
[im 12/47]
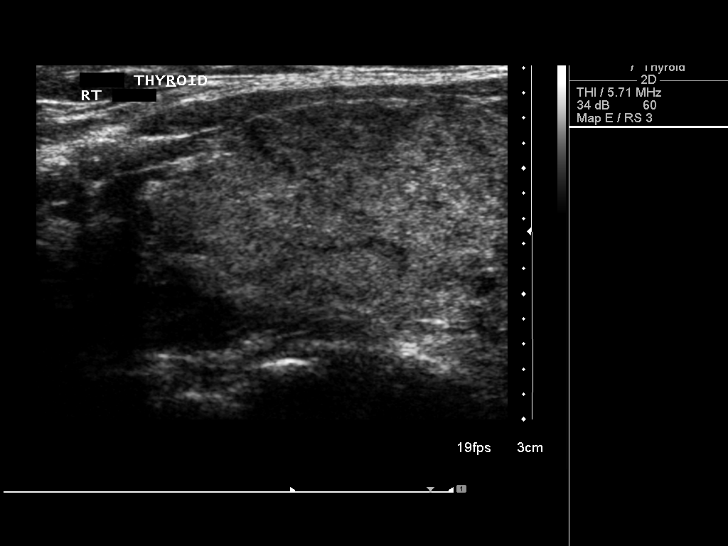
[im 16/47]
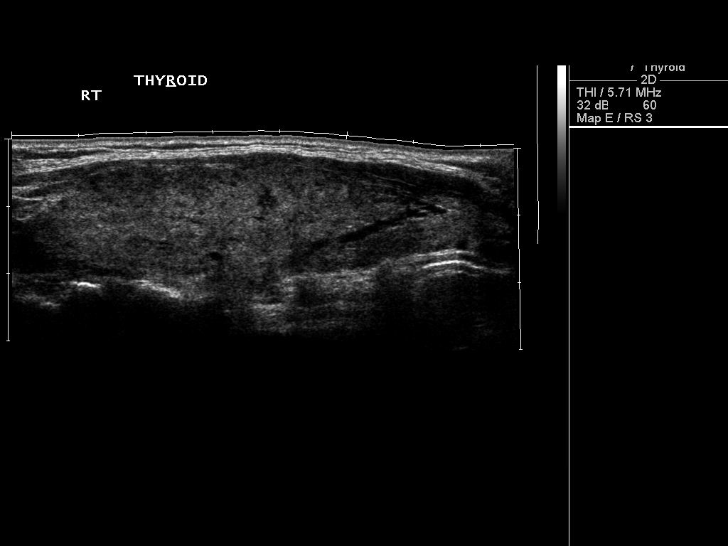
[im 18/47]
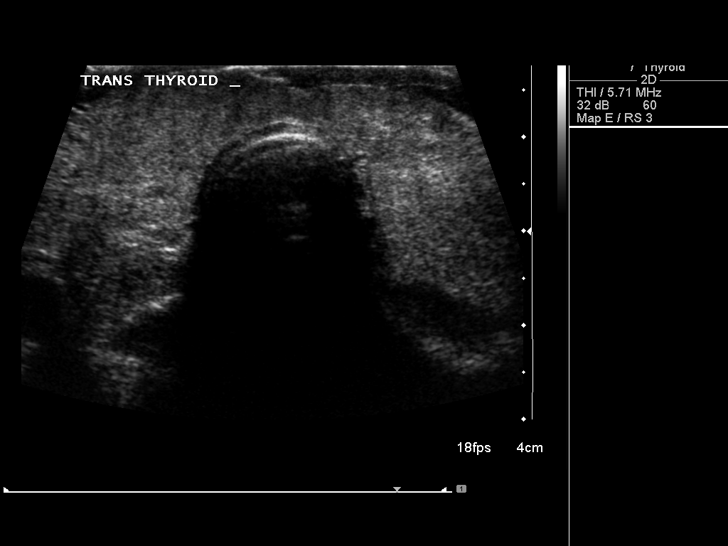
[im 22/47]
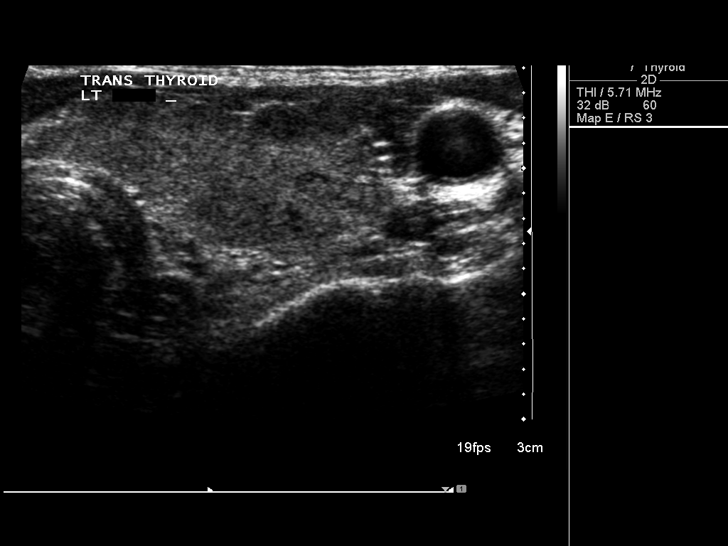
[im 25/47]
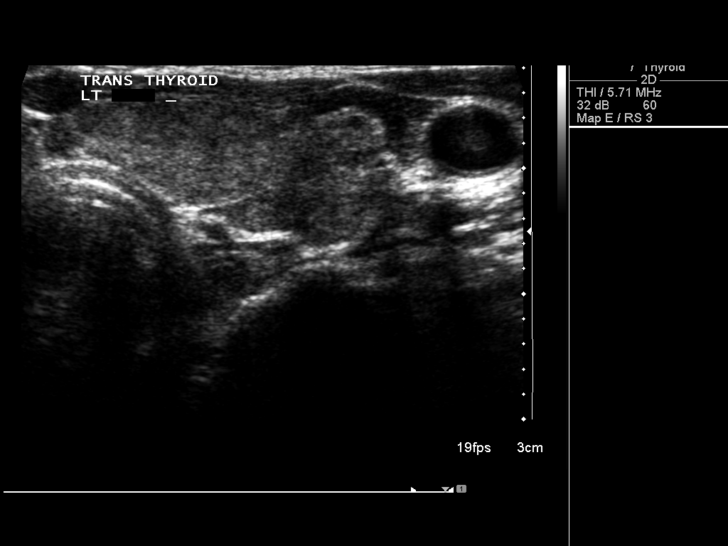
[im 29/47]
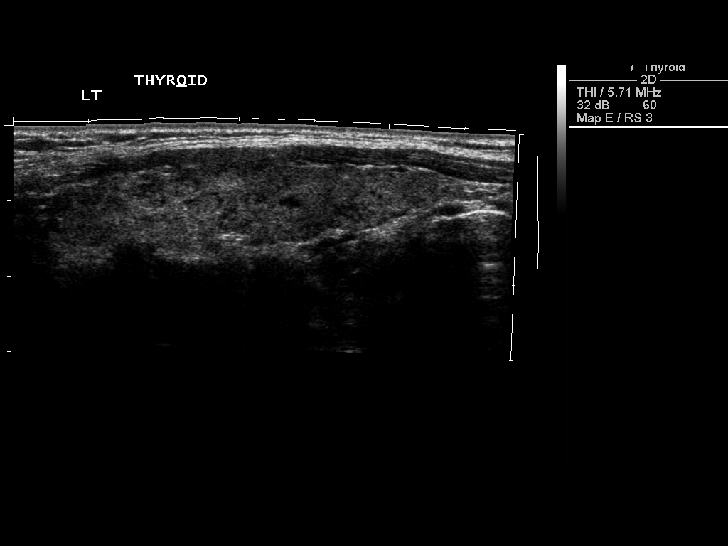
[im 31/47]
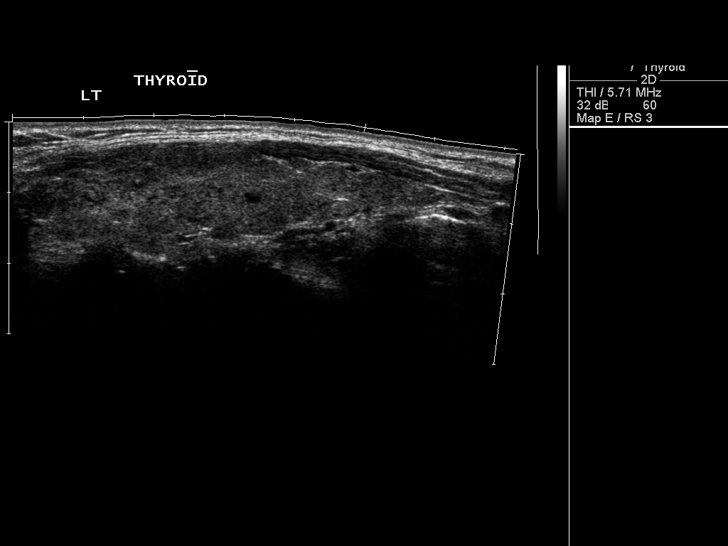
[im 35/47]
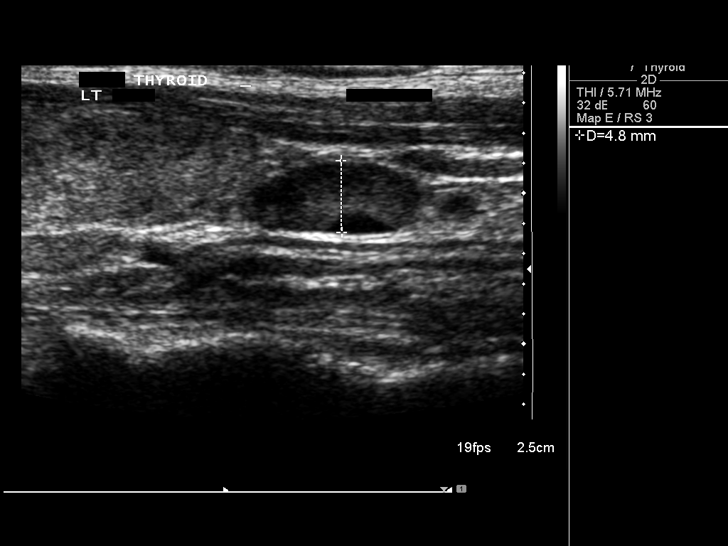
[im 39/47]
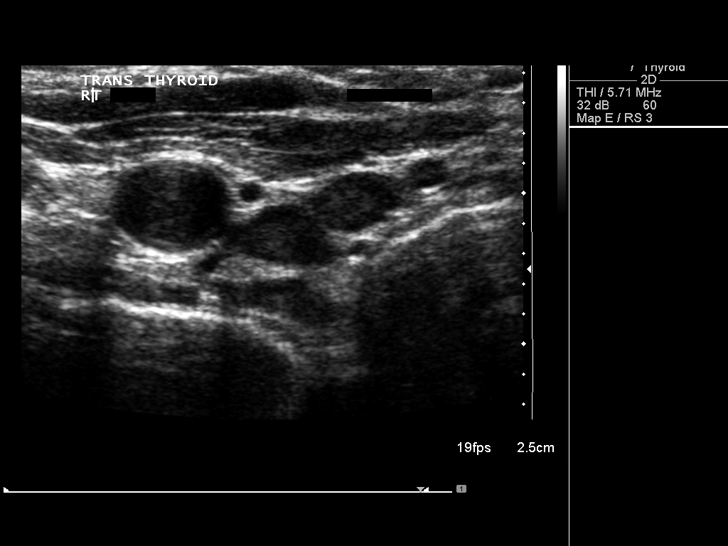
[im 43/47]
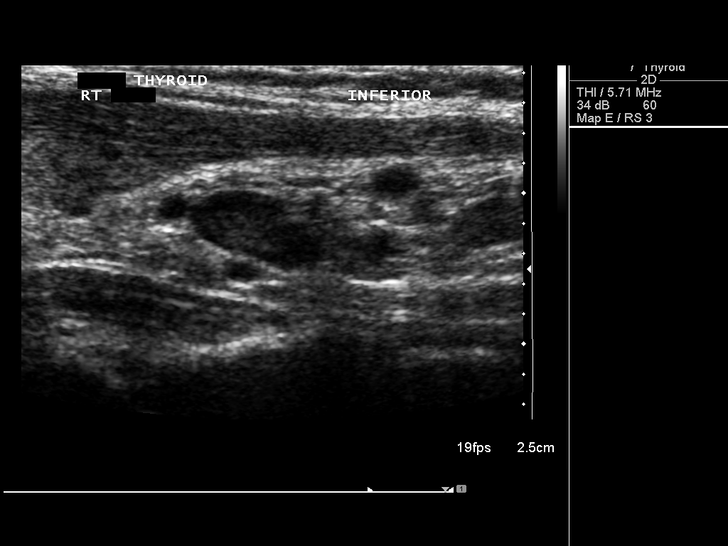
[im 47/47]
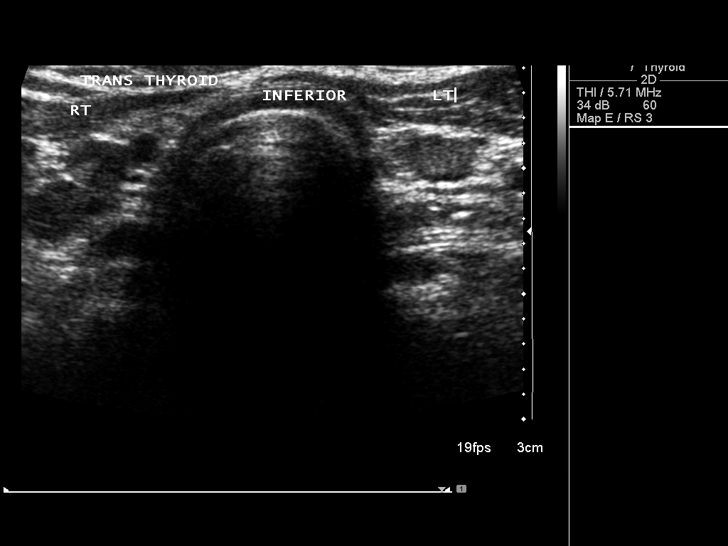

[14 of 25 positions shown; findings below may reference images not displayed]

FINDINGS: The thyroid gland is lobular and inhomogeneous in echotexture.

Right thyroid lobe:  Measures 7.3 x 2.1 x 2.4 cm.
Left thyroid lobe:  Measures 6.3 x 1.6 x 2.1 cm.
Isthmus:  Measures 0.5 cm.

Focal nodules:  None

Lymphadenopathy:  No lymphadenopathy visualized. Several small
nodes are seen inferior to the thyroid gland in the right and left
sides of the neck, measuring less than or equal to 5 mm in short
axis.
IMPRESSION: Enlarged inhomogeneous thyroid gland is compatible with clinical
history of goiter.

No focal thyroid nodules are identified.

## 2011-03-12 ENCOUNTER — Ambulatory Visit: Payer: 59 | Attending: Family Medicine | Admitting: Physical Therapy

## 2011-03-12 DIAGNOSIS — IMO0001 Reserved for inherently not codable concepts without codable children: Secondary | ICD-10-CM | POA: Insufficient documentation

## 2011-03-12 DIAGNOSIS — M545 Low back pain, unspecified: Secondary | ICD-10-CM | POA: Insufficient documentation

## 2011-03-12 DIAGNOSIS — R5381 Other malaise: Secondary | ICD-10-CM | POA: Insufficient documentation

## 2011-03-12 DIAGNOSIS — R293 Abnormal posture: Secondary | ICD-10-CM | POA: Insufficient documentation

## 2011-03-19 ENCOUNTER — Ambulatory Visit: Payer: 59 | Admitting: Physical Therapy

## 2011-03-26 ENCOUNTER — Ambulatory Visit: Payer: 59 | Attending: Family Medicine | Admitting: Physical Therapy

## 2011-03-26 DIAGNOSIS — R293 Abnormal posture: Secondary | ICD-10-CM | POA: Insufficient documentation

## 2011-03-26 DIAGNOSIS — M545 Low back pain, unspecified: Secondary | ICD-10-CM | POA: Insufficient documentation

## 2011-03-26 DIAGNOSIS — R5381 Other malaise: Secondary | ICD-10-CM | POA: Insufficient documentation

## 2011-03-26 DIAGNOSIS — IMO0001 Reserved for inherently not codable concepts without codable children: Secondary | ICD-10-CM | POA: Insufficient documentation

## 2011-04-02 ENCOUNTER — Encounter: Payer: 59 | Admitting: Physical Therapy

## 2011-04-09 ENCOUNTER — Encounter: Payer: 59 | Admitting: Physical Therapy

## 2011-04-16 ENCOUNTER — Ambulatory Visit: Payer: 59 | Admitting: Physical Therapy

## 2011-04-30 ENCOUNTER — Encounter: Payer: 59 | Admitting: Physical Therapy

## 2011-05-14 ENCOUNTER — Encounter: Payer: 59 | Admitting: Physical Therapy

## 2012-04-20 ENCOUNTER — Other Ambulatory Visit (HOSPITAL_COMMUNITY): Payer: Self-pay | Admitting: Gynecology

## 2012-04-20 DIAGNOSIS — Z1231 Encounter for screening mammogram for malignant neoplasm of breast: Secondary | ICD-10-CM

## 2012-05-13 ENCOUNTER — Ambulatory Visit (HOSPITAL_COMMUNITY)
Admission: RE | Admit: 2012-05-13 | Discharge: 2012-05-13 | Disposition: A | Discharge status: Home / Self Care | Payer: 59 | Source: Ambulatory Visit | Attending: Gynecology | Admitting: Gynecology

## 2012-05-13 DIAGNOSIS — Z1231 Encounter for screening mammogram for malignant neoplasm of breast: Secondary | ICD-10-CM | POA: Insufficient documentation

## 2012-05-13 IMAGING — MG MM DIGITAL SCREENING BILAT
5 series · 5 of 5 positions shown · non-contrast
Comparison: Prior exams

CLINICAL DATA: Screening.

DIGITAL SCREENING MAMMOGRAM WITH CAD

[R CC]
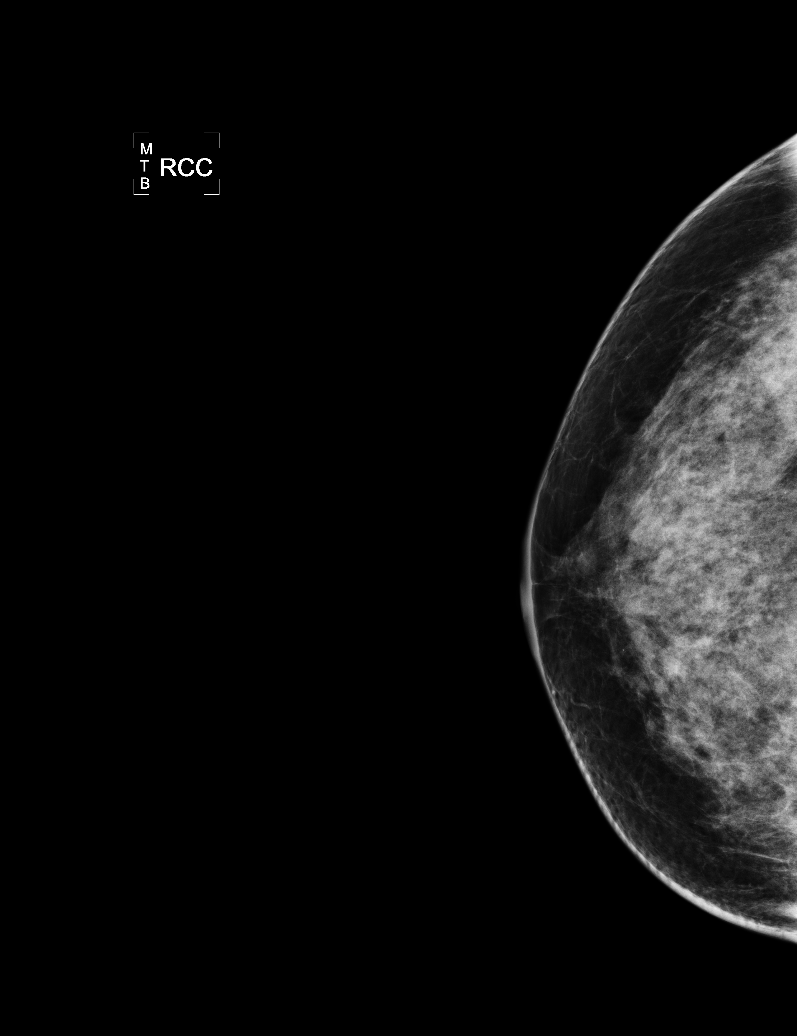

[R MLO]
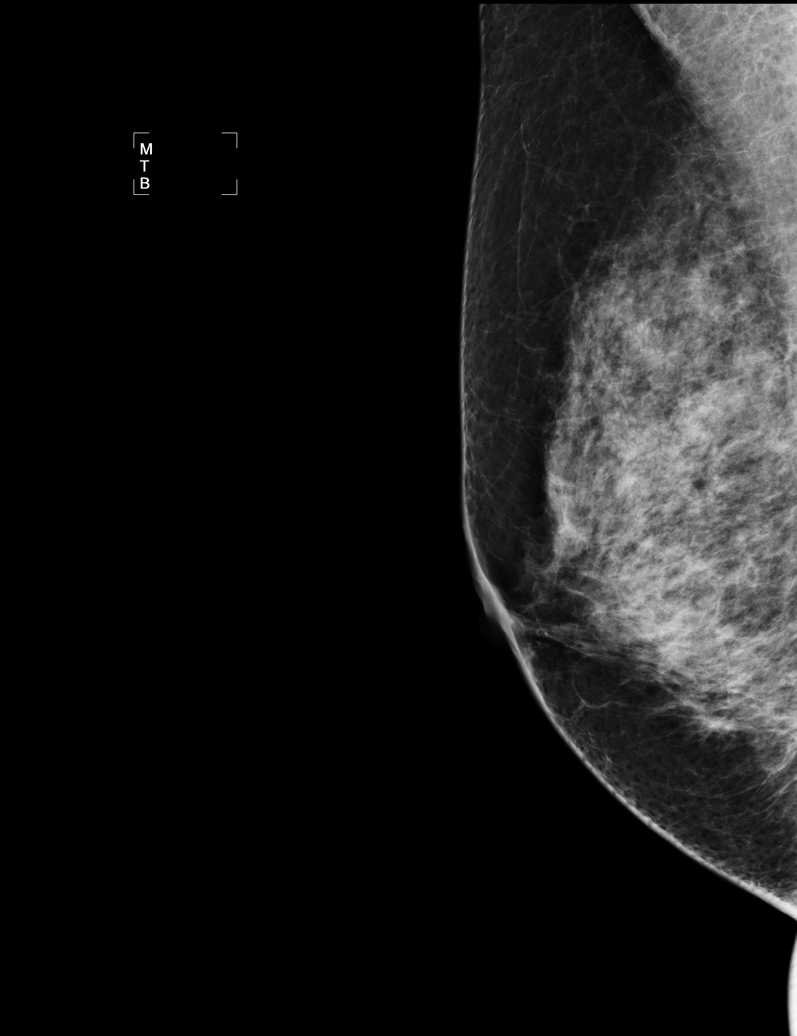

[L CC]
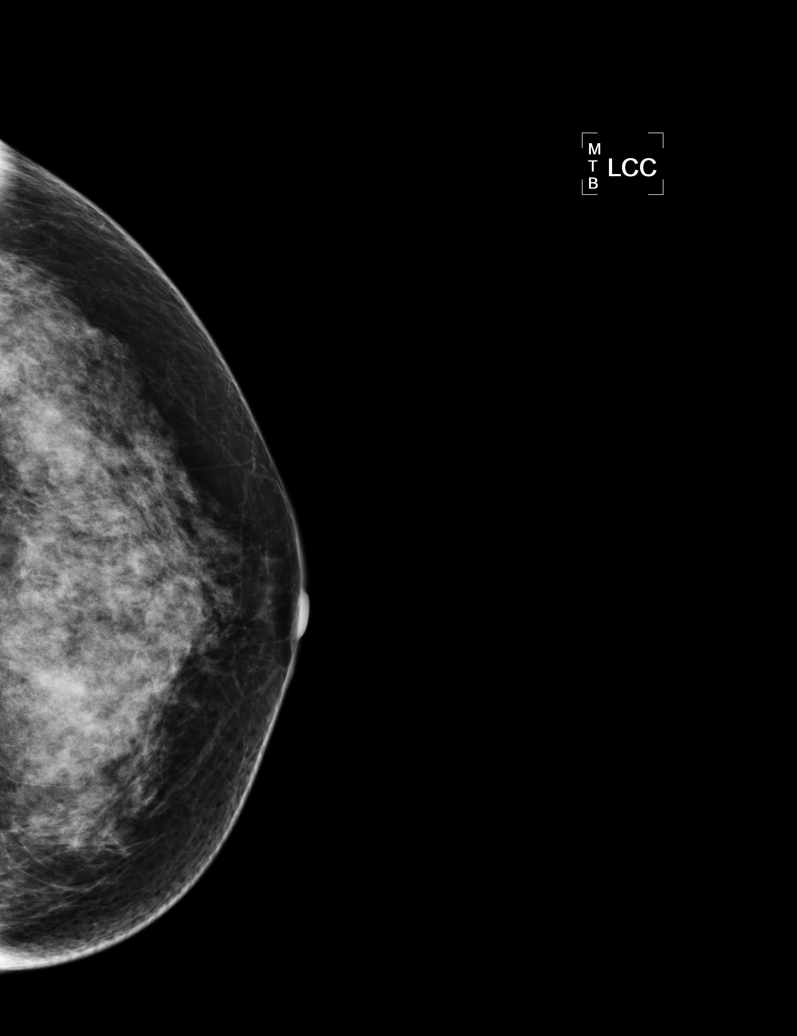

[L MLO (1 of 2)]
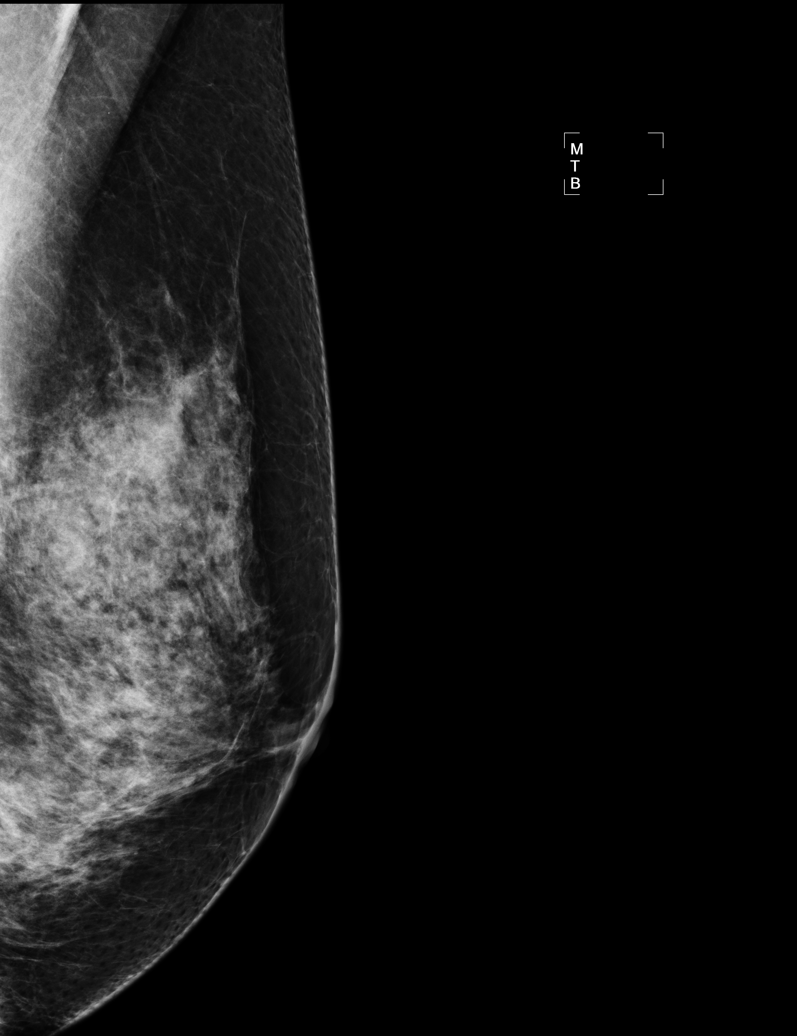

[L MLO (2 of 2)]
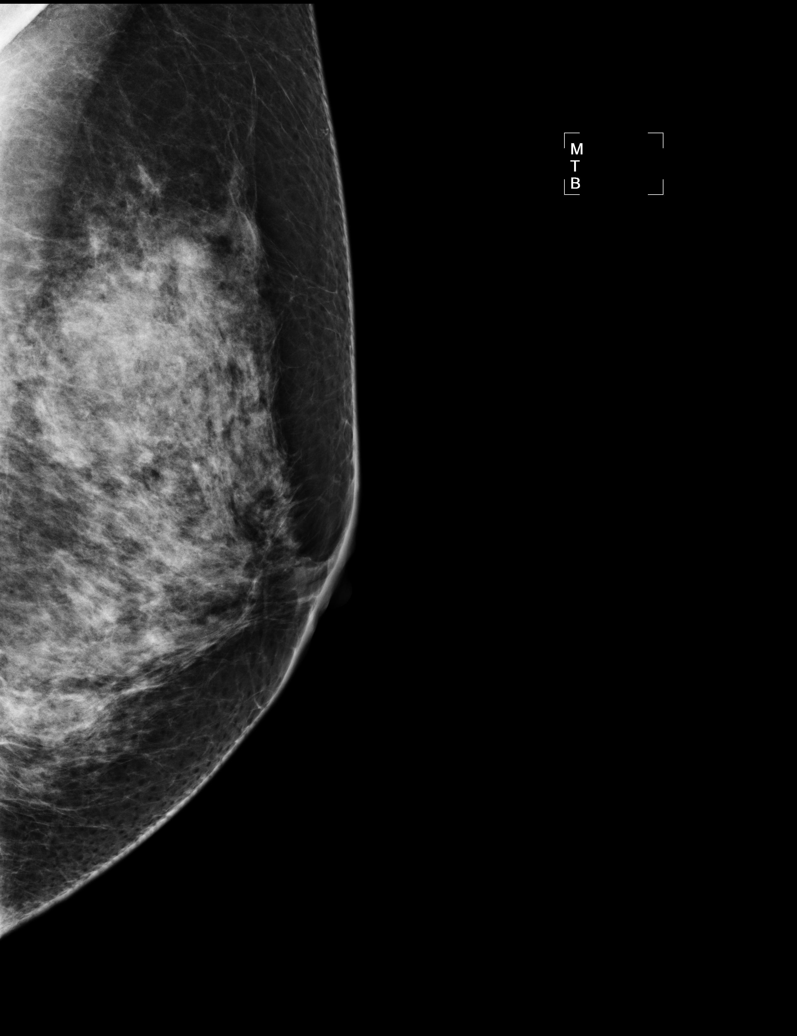

[5 of 5 positions shown; findings below may reference images not displayed]

FINDINGS: Two views of each breast demonstrate extremely dense
breast parenchyma.  In the left breast, calcifications warrant
further evaluation with magnified views.  In the right breast, no
mass or malignant type calcifications are identified.

Images were processed with CAD.
IMPRESSION: Further evaluation is suggested for calcifications in the left
breast.

RECOMMENDATION:
Diagnostic mammogram of the left breast. (Code:[5L])

BI-RADS CATEGORY 0:  Incomplete.  Need additional imaging
evaluation and/or prior mammograms for comparison.

## 2012-05-18 ENCOUNTER — Other Ambulatory Visit: Payer: Self-pay | Admitting: Gynecology

## 2012-05-18 DIAGNOSIS — R928 Other abnormal and inconclusive findings on diagnostic imaging of breast: Secondary | ICD-10-CM

## 2012-05-24 ENCOUNTER — Ambulatory Visit
Admission: RE | Admit: 2012-05-24 | Discharge: 2012-05-24 | Disposition: A | Discharge status: Home / Self Care | Payer: 59 | Source: Ambulatory Visit | Attending: Gynecology | Admitting: Gynecology

## 2012-05-24 DIAGNOSIS — R928 Other abnormal and inconclusive findings on diagnostic imaging of breast: Secondary | ICD-10-CM

## 2012-05-24 IMAGING — MG MM DIGITAL DIAGNOSTIC LIMITED*L*
3 series · 3 of 3 positions shown · non-contrast
Comparison: [DATE]

CLINICAL DATA: The patient returns for evaluation of
calcifications in the left breast noted on recent screening study
dated [DATE].

[REDACTED] MAMMOGRAM

[L CC]
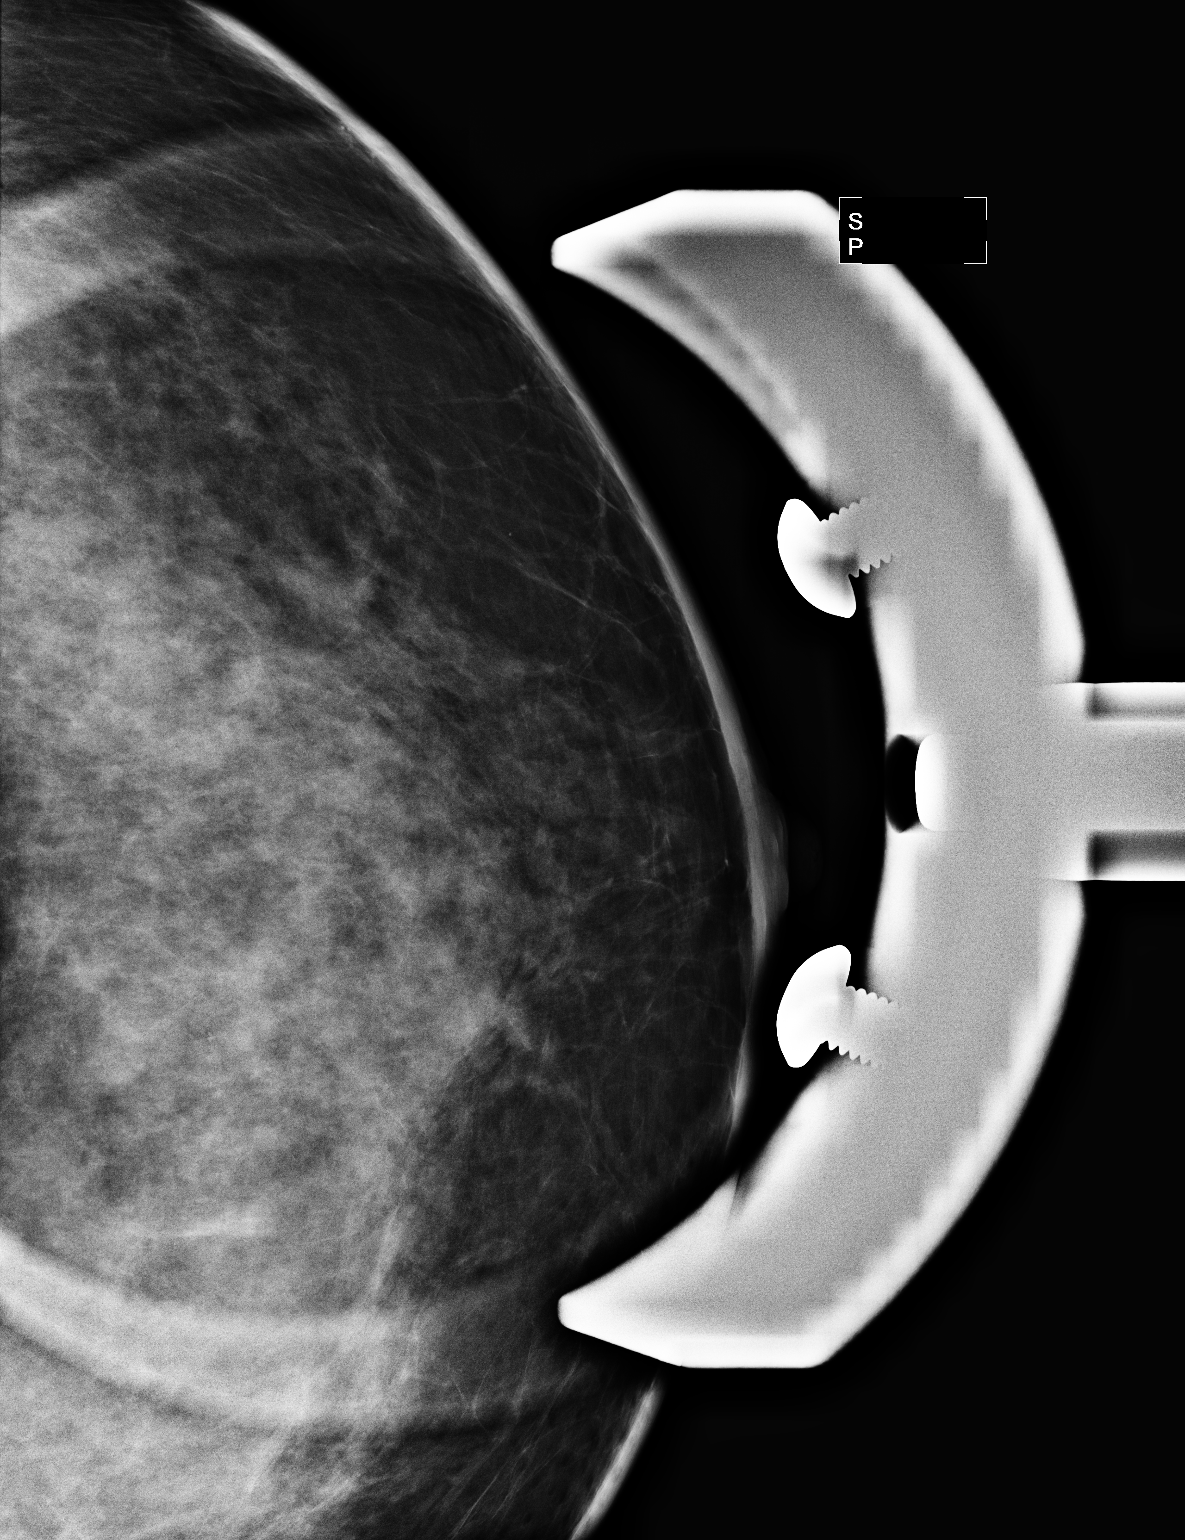

[L ML (1 of 2)]
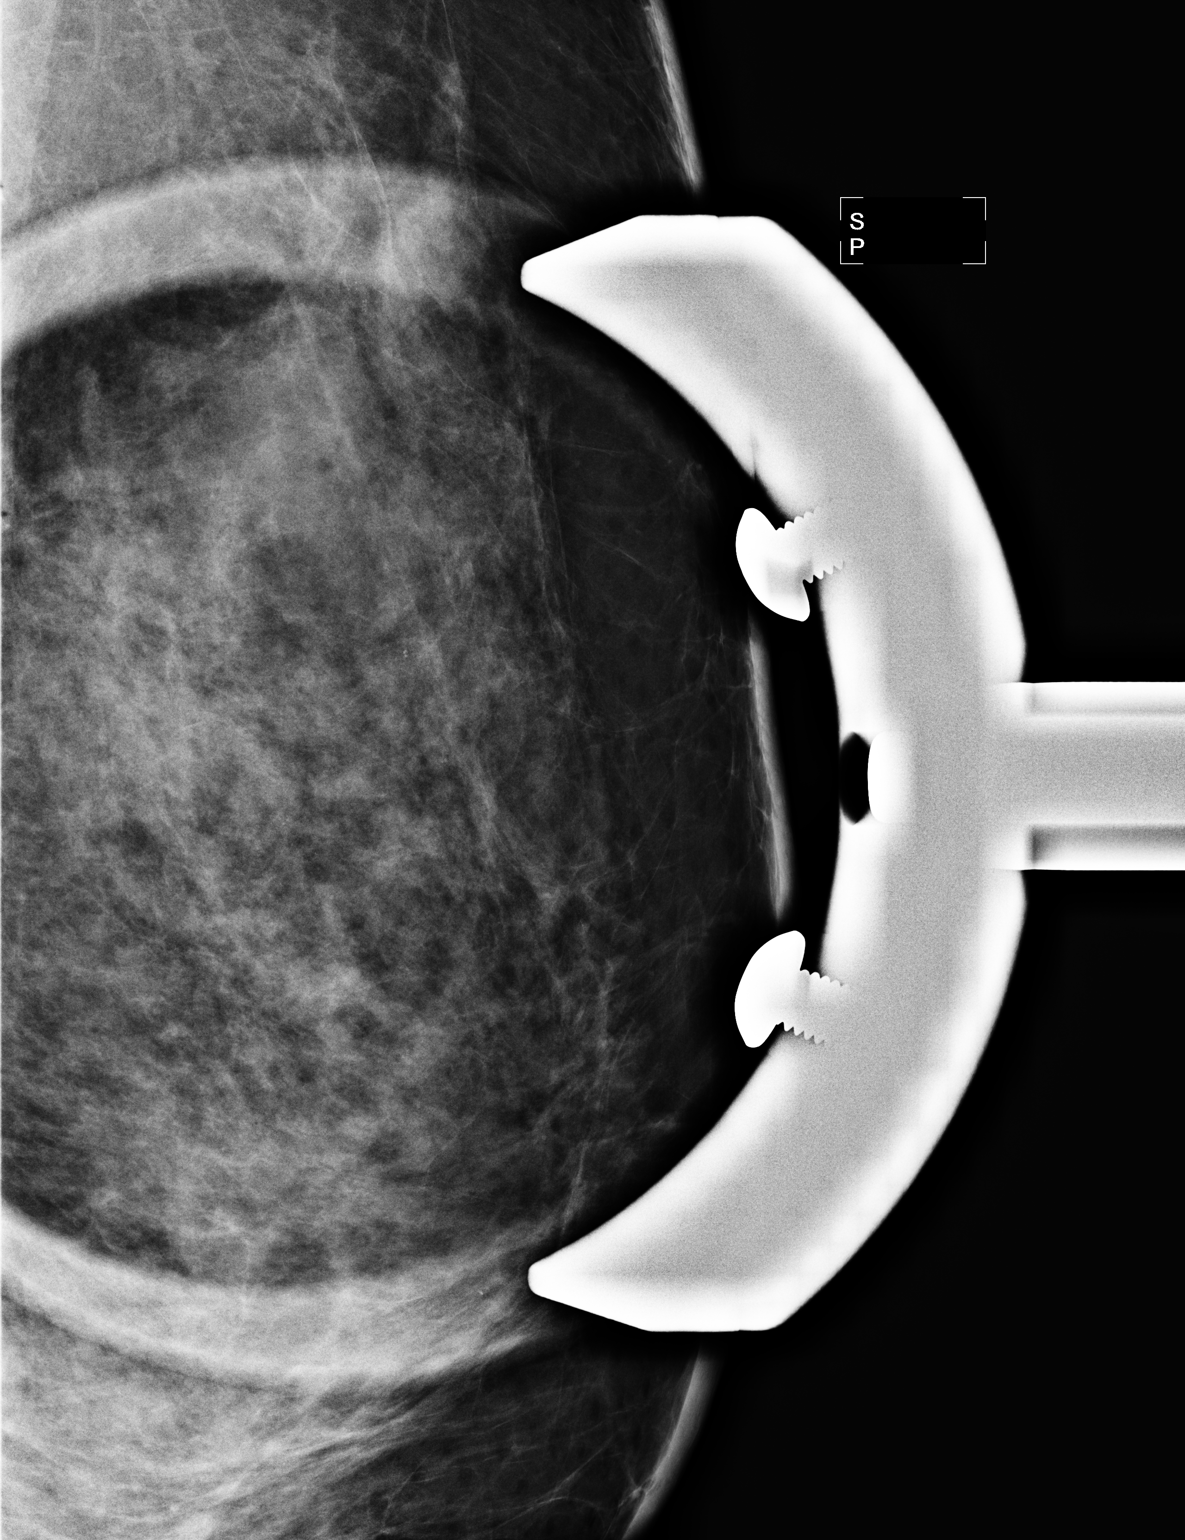

[L ML (2 of 2)]
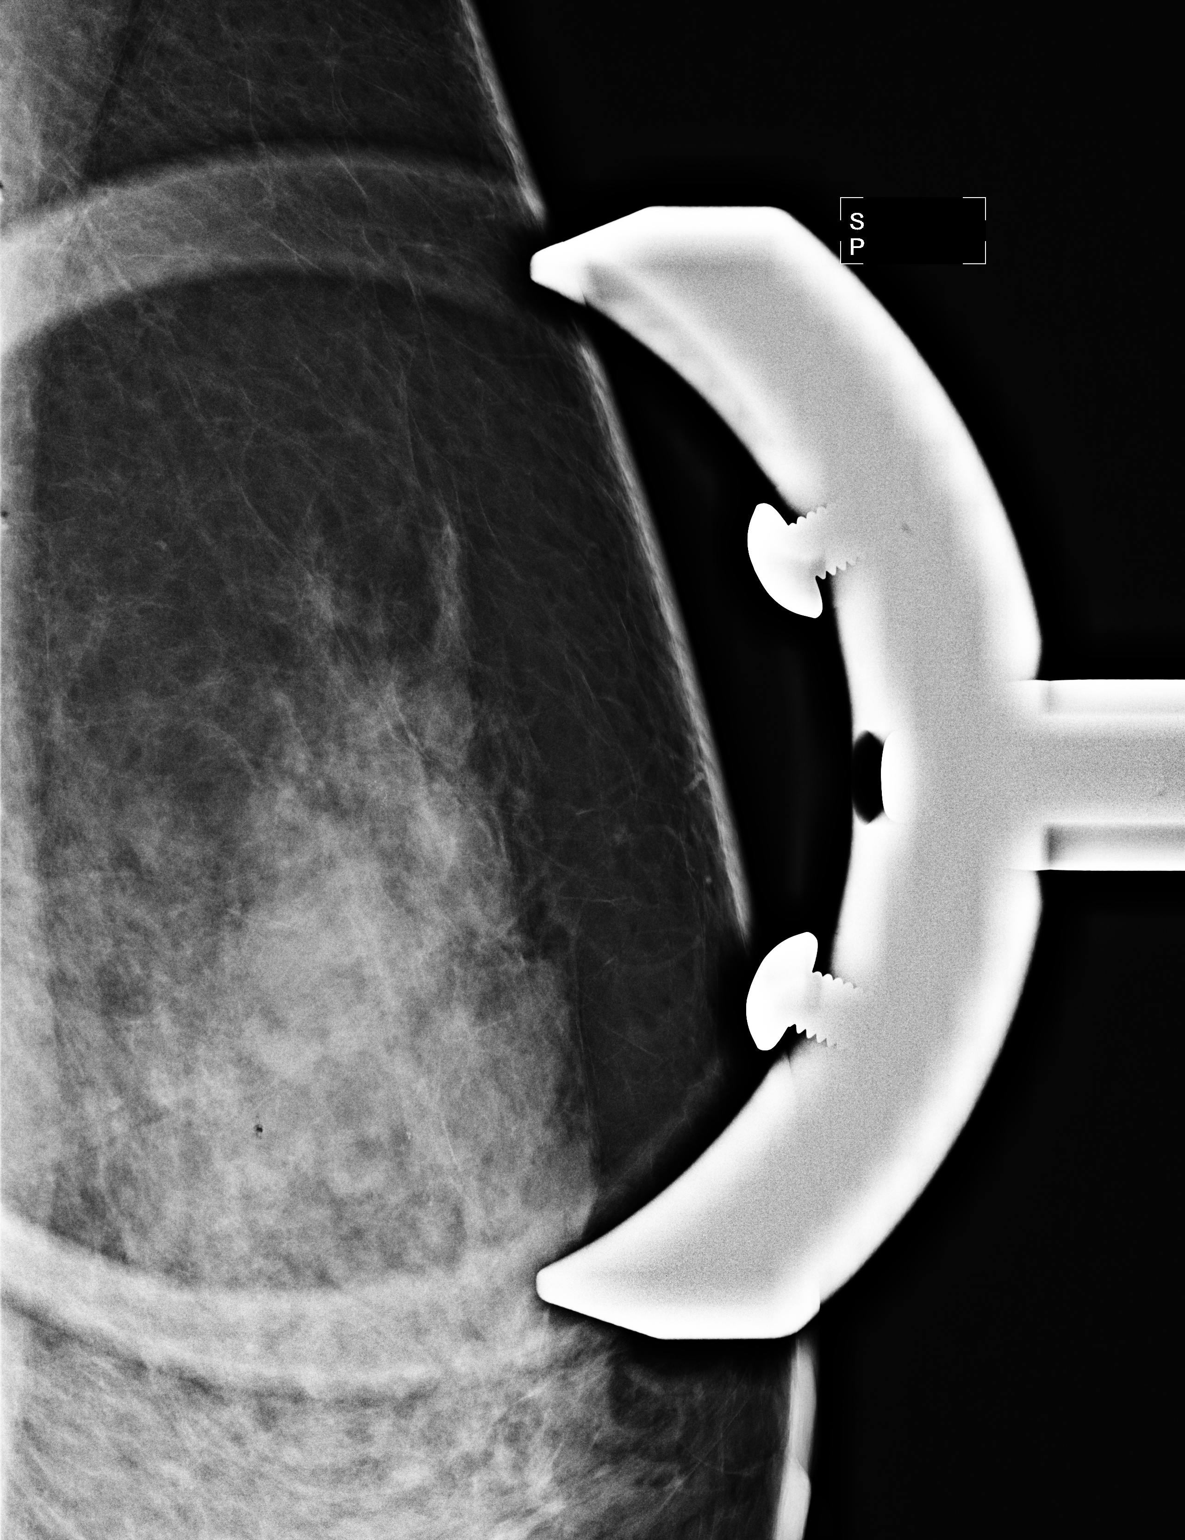

[3 of 3 positions shown; findings below may reference images not displayed]

FINDINGS: Magnification views demonstrate round
microcalcifications in the 12 o'clock position of the left breast
posteriorly.  These are unchanged from prior study in [UP].  No
suspicious linear forms are noted.
Mammographic images were processed with CAD.
IMPRESSION: No mammographic evidence of malignancy.

RECOMMENDATION:
Yearly screening mammography is suggested.

BI-RADS CATEGORY 1:  Negative.

## 2015-08-07 ENCOUNTER — Other Ambulatory Visit: Payer: Self-pay | Admitting: Endocrinology

## 2015-08-07 DIAGNOSIS — E049 Nontoxic goiter, unspecified: Secondary | ICD-10-CM

## 2015-08-13 ENCOUNTER — Other Ambulatory Visit: Payer: Self-pay | Admitting: Orthopedic Surgery

## 2015-08-13 ENCOUNTER — Ambulatory Visit
Admission: RE | Admit: 2015-08-13 | Discharge: 2015-08-13 | Disposition: A | Discharge status: Home / Self Care | Payer: Commercial Managed Care - HMO | Source: Ambulatory Visit | Attending: Endocrinology | Admitting: Endocrinology

## 2015-08-13 DIAGNOSIS — M5416 Radiculopathy, lumbar region: Secondary | ICD-10-CM

## 2015-08-13 DIAGNOSIS — E049 Nontoxic goiter, unspecified: Secondary | ICD-10-CM

## 2015-08-13 IMAGING — US US SOFT TISSUE HEAD/NECK
1 series · 14 of 25 positions shown · non-contrast
Comparison: [DATE]

CLINICAL DATA: Nontoxic goiter

EXAM:
THYROID ULTRASOUND
TECHNIQUE: Ultrasound examination of the thyroid gland and adjacent soft
tissues was performed.

[Series 1: us soft tissue head/neck · 0.07mm/px · 14 of 48 slices shown]
[im 1/48]
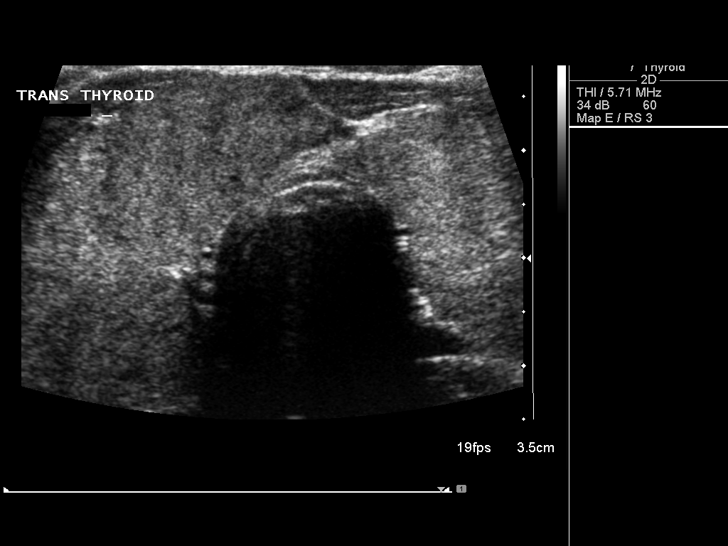
[im 4/48]
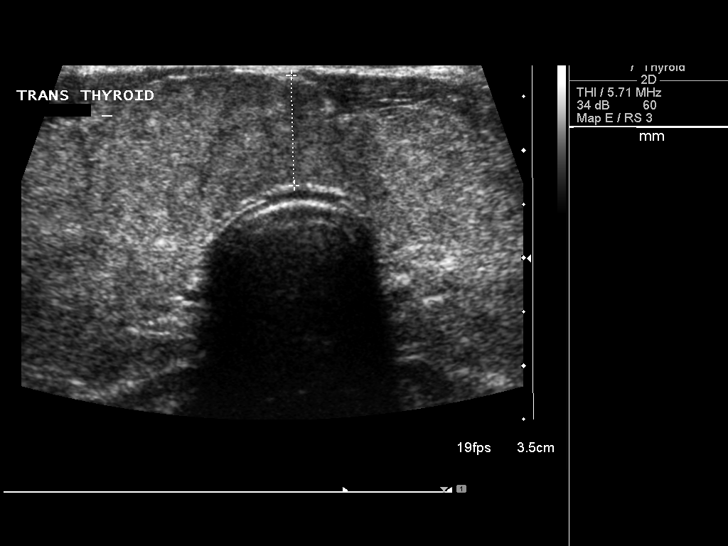
[im 8/48]
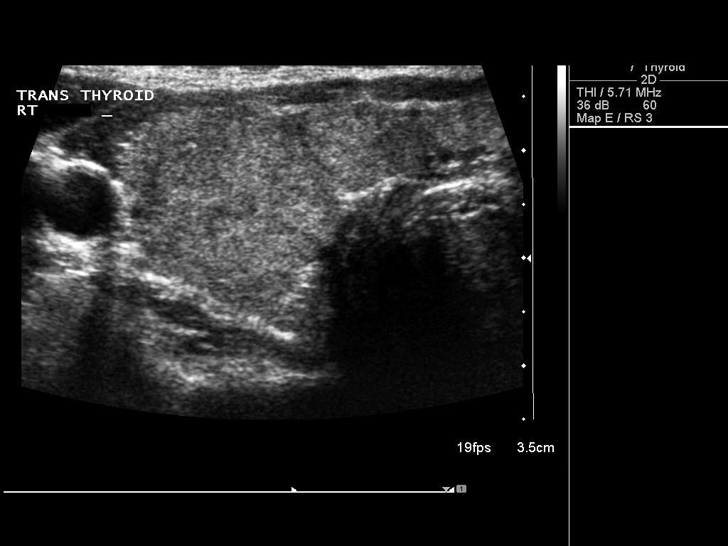
[im 12/48]
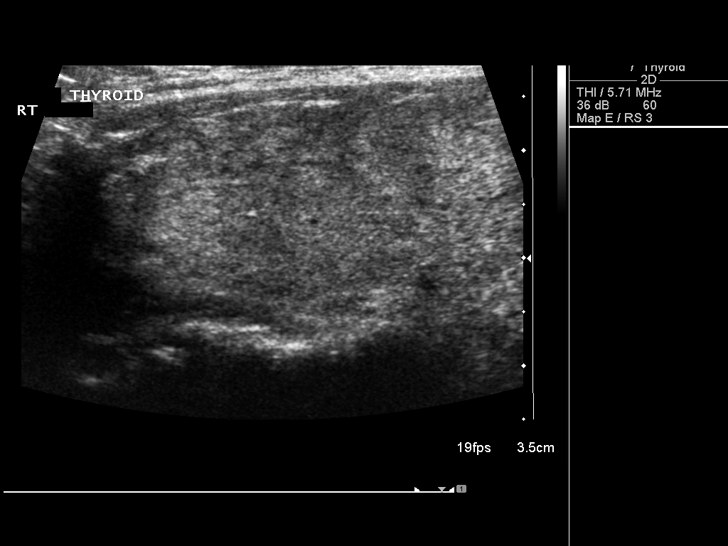
[im 16/48]
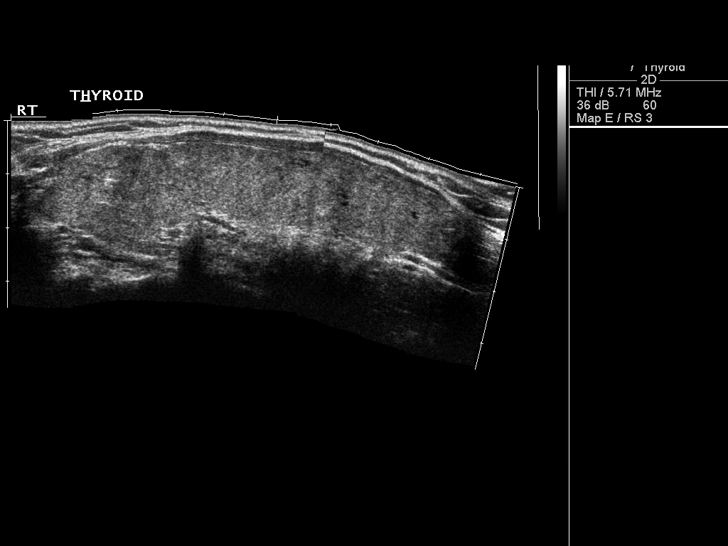
[im 18/48]
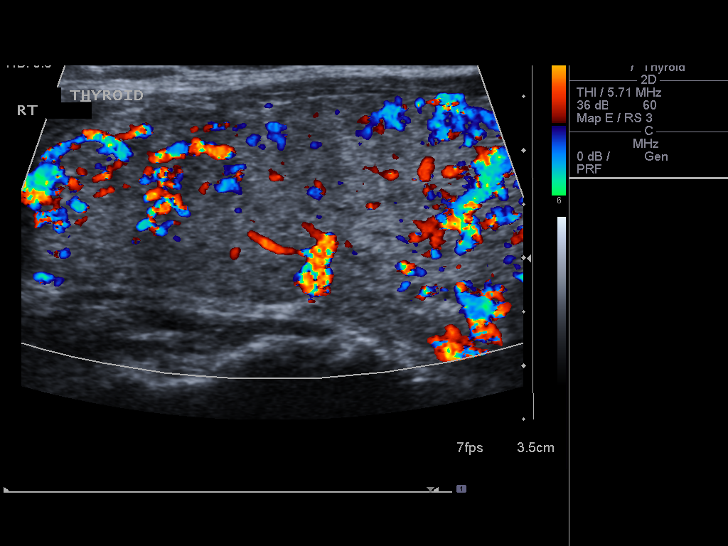
[im 22/48]
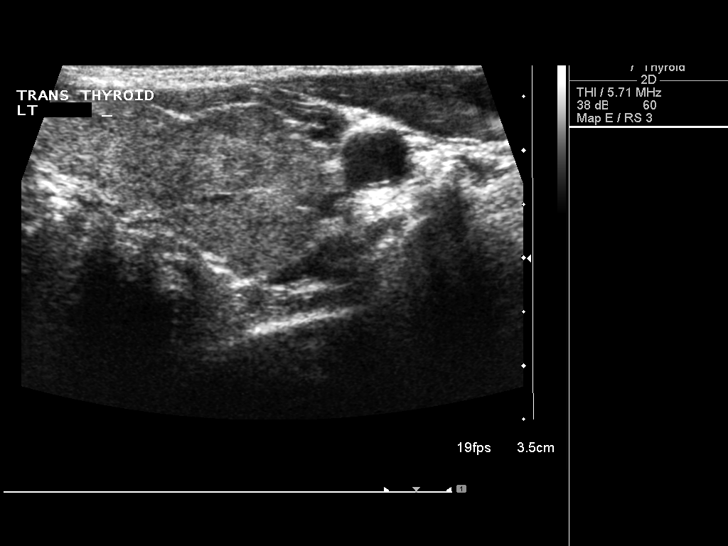
[im 26/48]
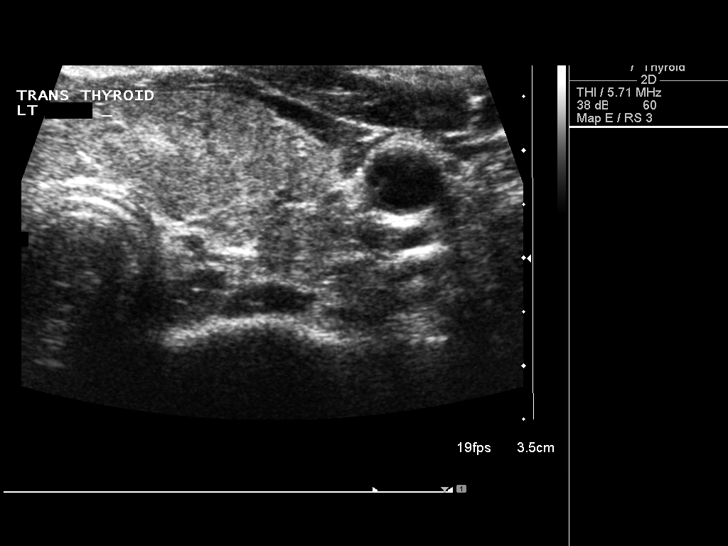
[im 30/48]
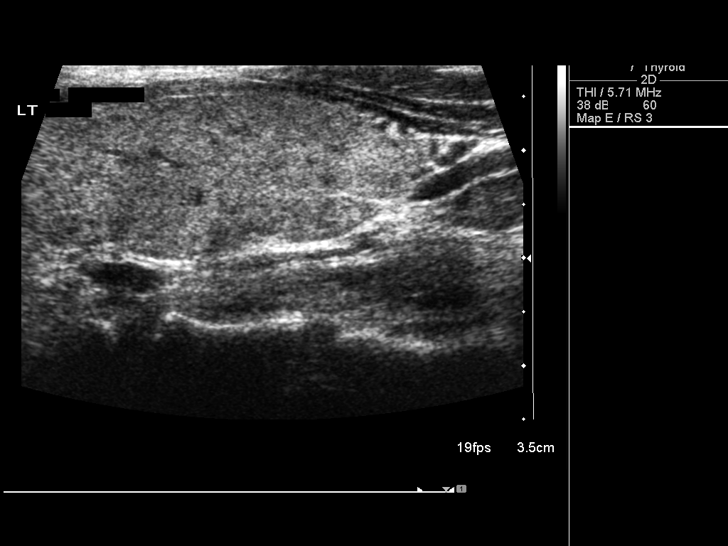
[im 32/48]
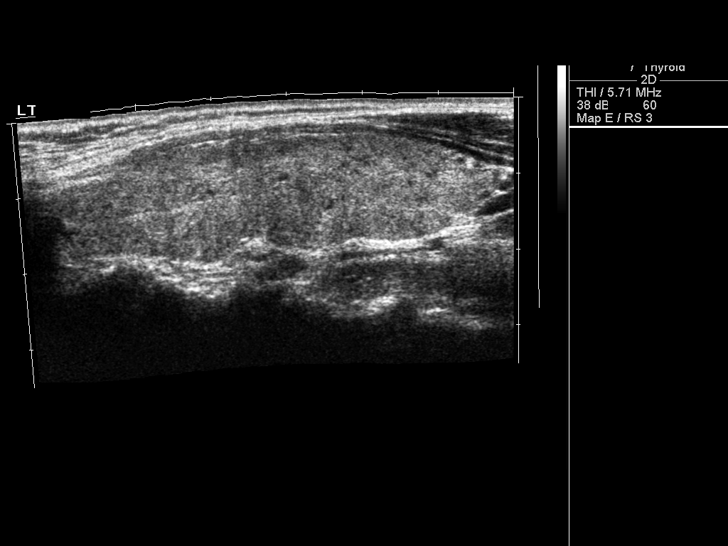
[im 36/48]
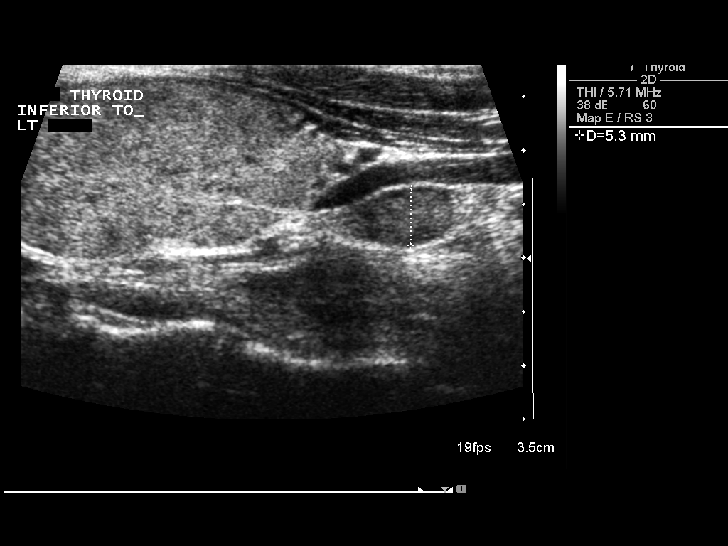
[im 40/48]
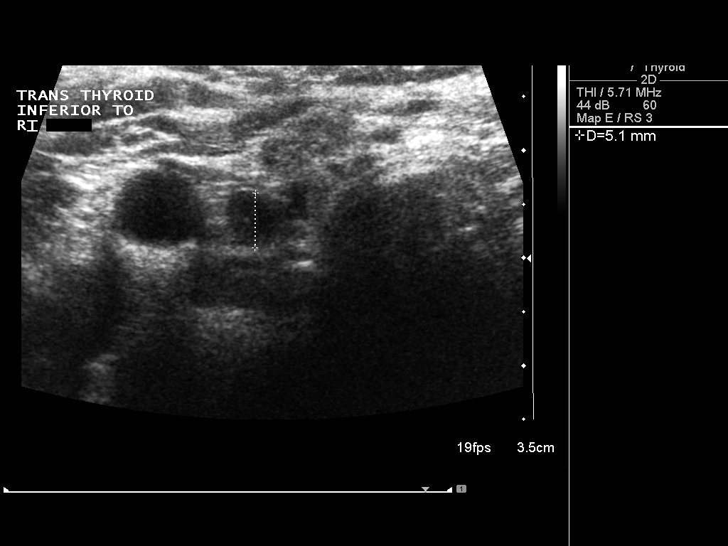
[im 44/48]
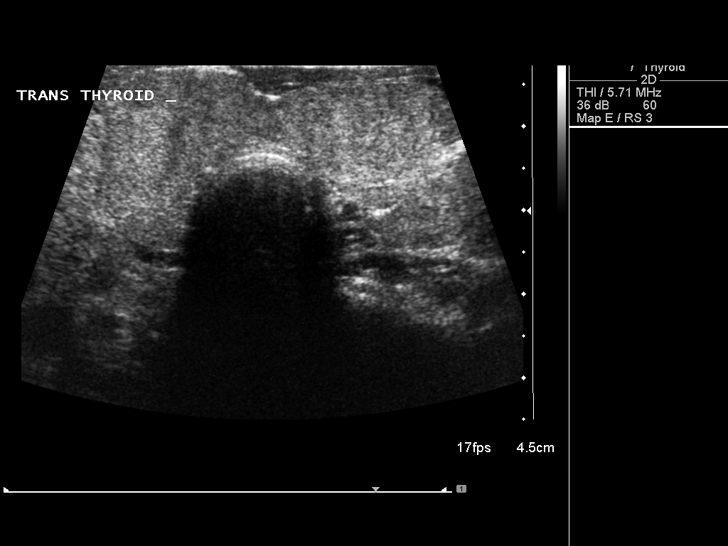
[im 48/48]
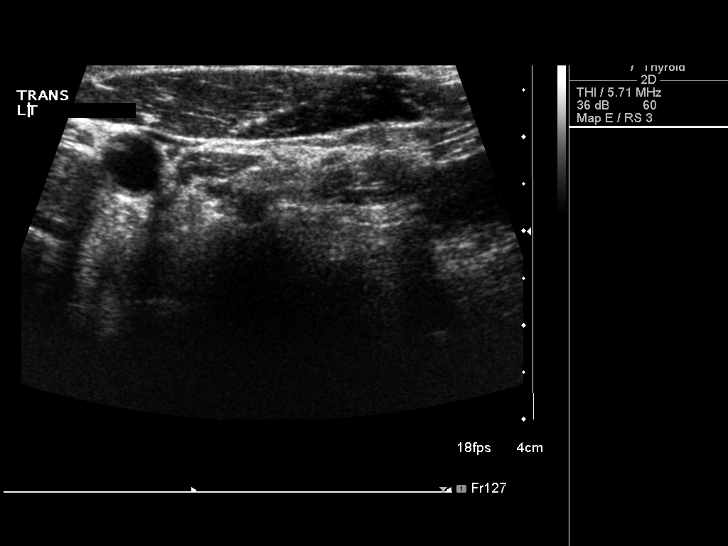

[14 of 25 positions shown; findings below may reference images not displayed]

FINDINGS: Right thyroid lobe

Measurements: 8.2 x 1.9 x 2.5 cm.. The gland is diffusely
heterogeneous without dominant nodule.

Left thyroid lobe

Measurements: 6.0 x 1.6 x 2.3 cm.. Diffusely heterogeneous without
dominant nodule.

Isthmus

Thickness: 1 cm.  No nodules visualized.

Lymphadenopathy

None visualized.
IMPRESSION: Enlarged thyroid gland with heterogeneity consistent with nontoxic
goiter. No focal nodules are noted.

## 2016-03-26 LAB — HM DEXA SCAN

## 2017-01-23 ENCOUNTER — Other Ambulatory Visit: Payer: Self-pay | Admitting: Endocrinology

## 2017-01-23 DIAGNOSIS — E049 Nontoxic goiter, unspecified: Secondary | ICD-10-CM

## 2017-03-10 ENCOUNTER — Other Ambulatory Visit: Payer: Commercial Managed Care - HMO

## 2017-04-09 IMAGING — US US THYROID
1 series · 13 of 25 positions shown · non-contrast
Comparison: Thyroid ultrasound - [DATE]

CLINICAL DATA: Prior ultrasound follow-up. Follow-up thyroid
goiter. Patient is currently on Synthroid.

EXAM:
THYROID ULTRASOUND
TECHNIQUE: Ultrasound examination of the thyroid gland and adjacent soft
tissues was performed.

[Series 1: us thyroid · 0.09mm/px · 13 of 38 slices shown]
[im 1/38]
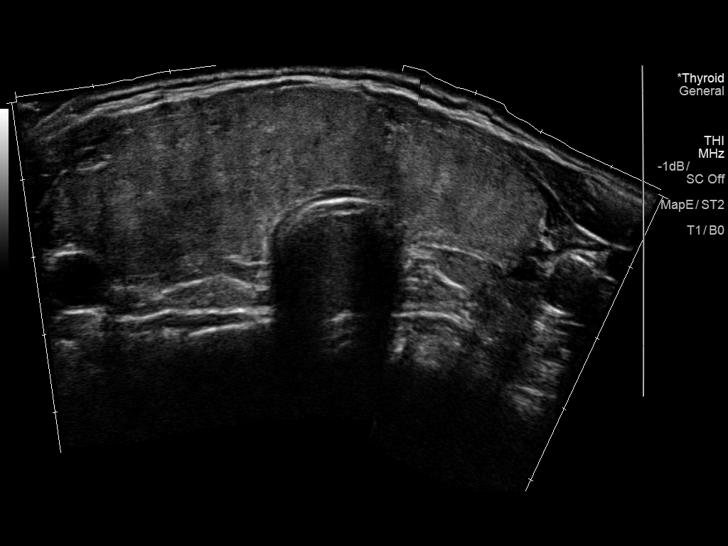
[im 4/38]
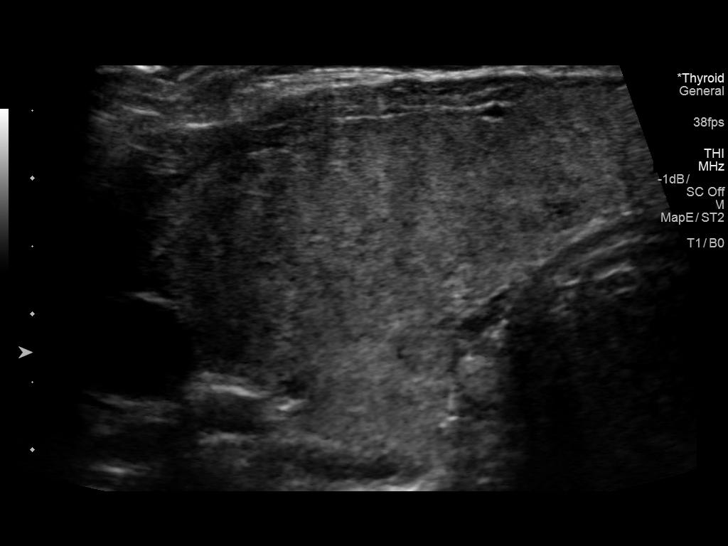
[im 7/38]
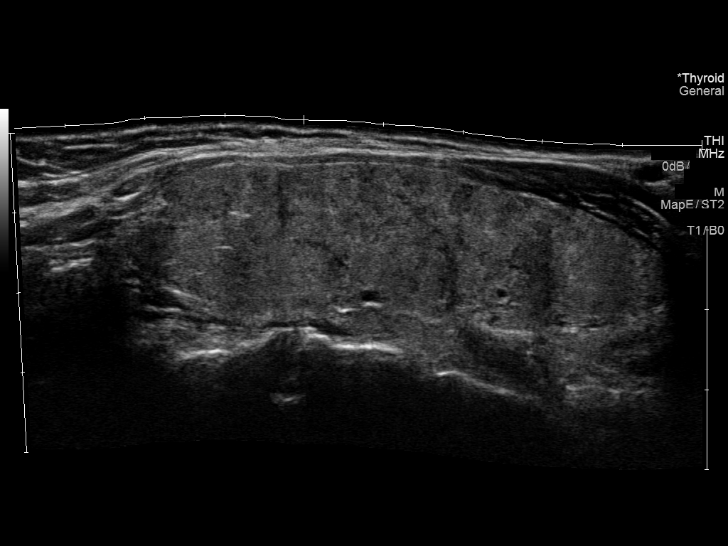
[im 10/38]
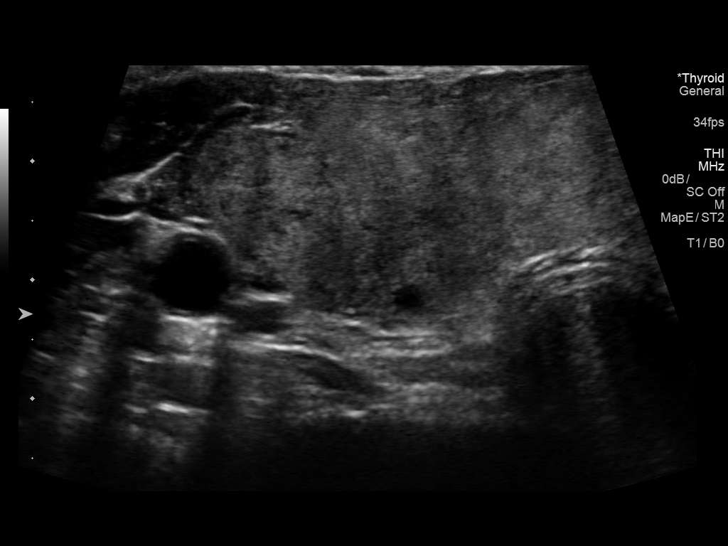
[im 13/38]
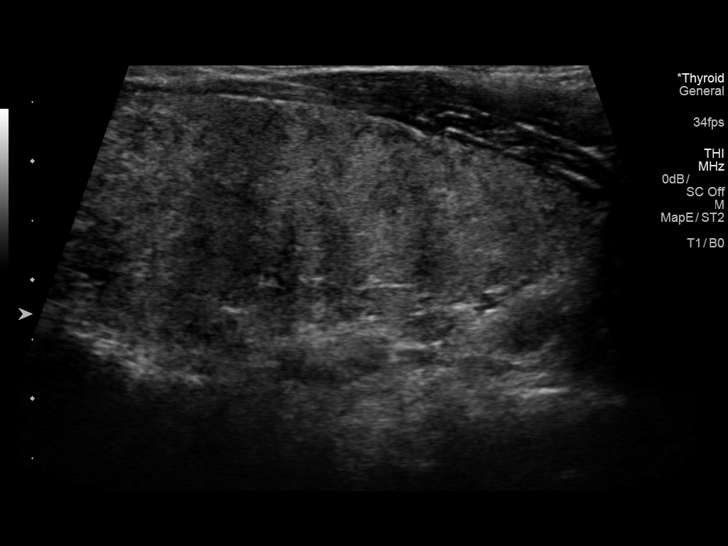
[im 16/38]
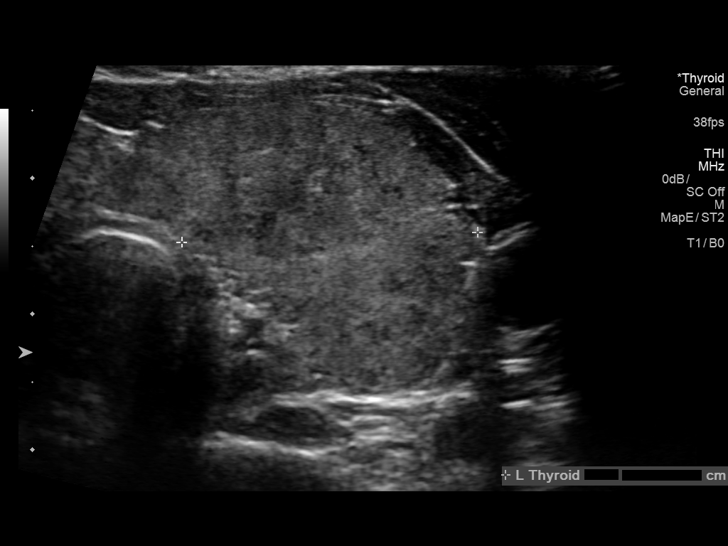
[im 19/38]
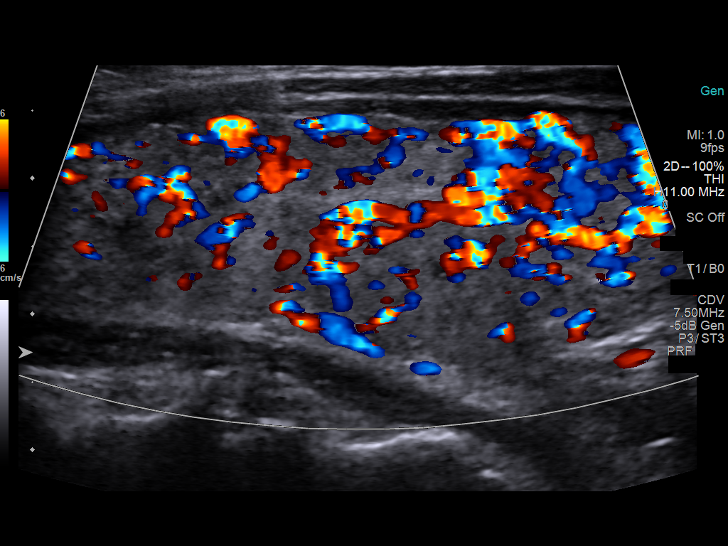
[im 22/38]
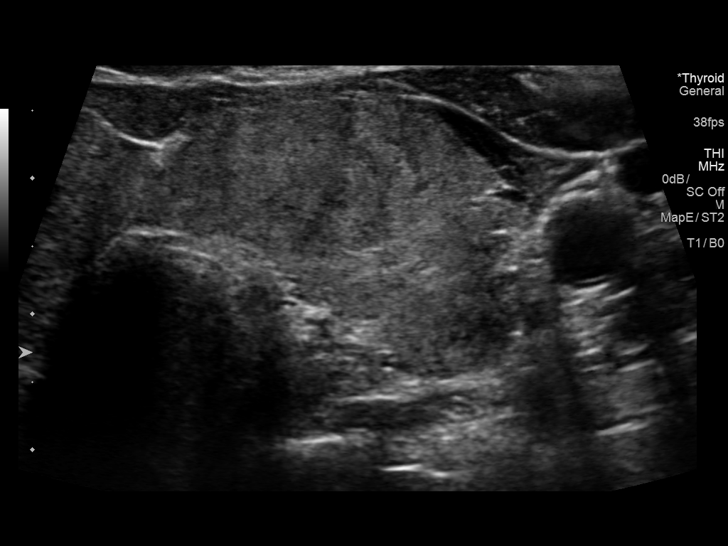
[im 25/38]
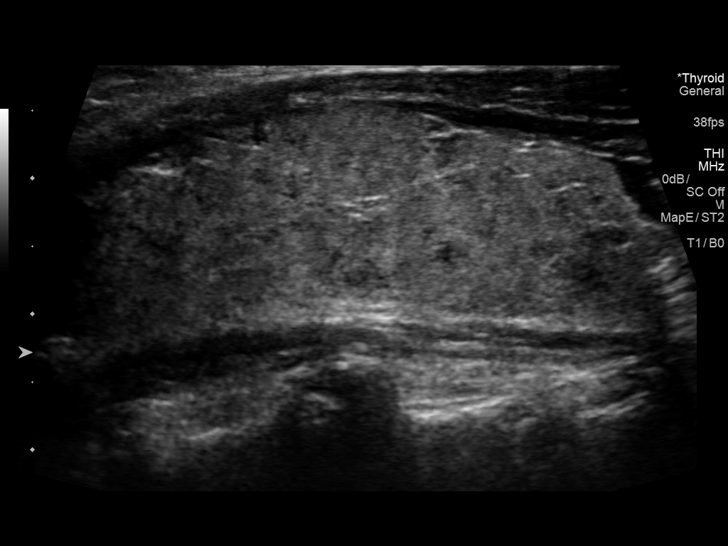
[im 28/38]
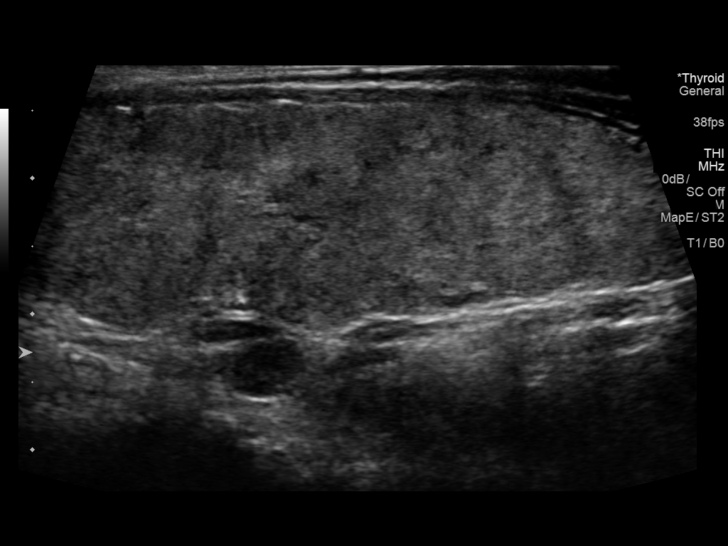
[im 31/38]
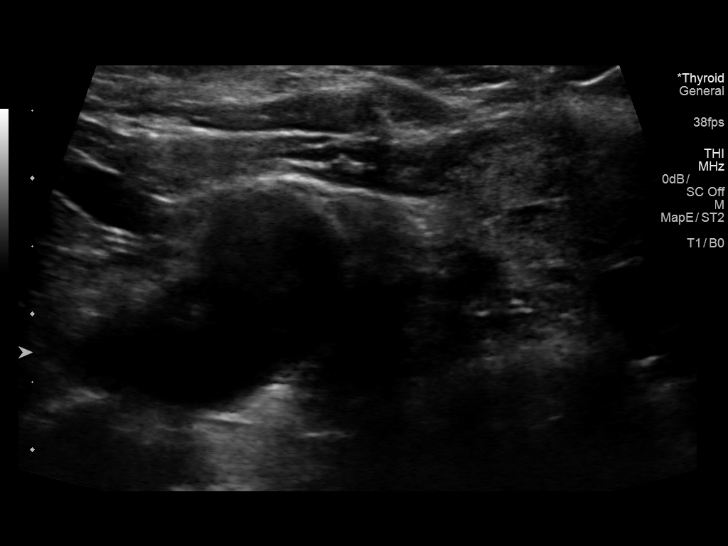
[im 34/38]
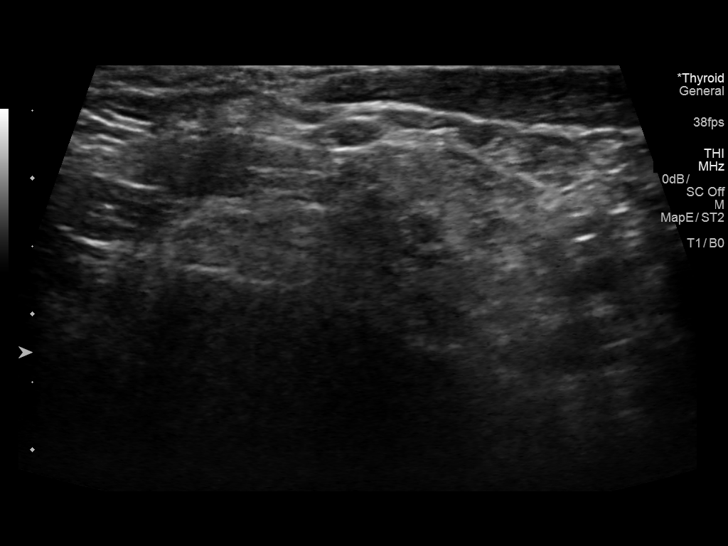
[im 38/38]
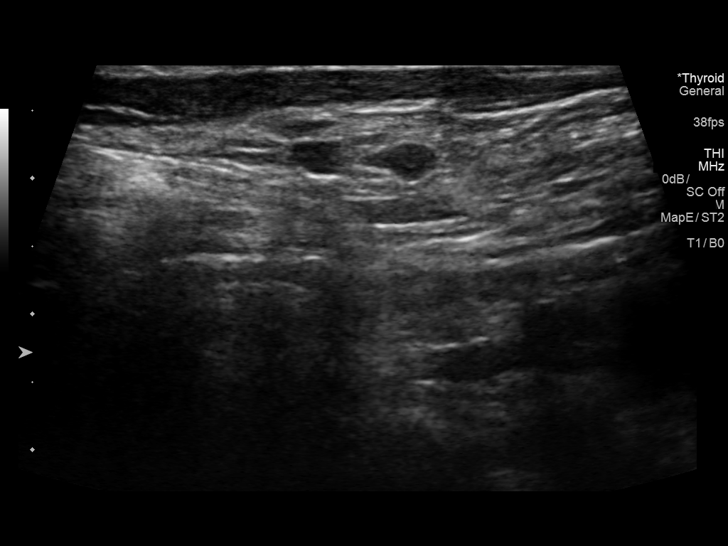

[13 of 25 positions shown; findings below may reference images not displayed]

FINDINGS: Parenchymal Echotexture: Moderately heterogenous - the gland appears
hyperemic (images 11 and 24)

Isthmus: Enlarged measuring 0.7 cm in diameter, unchanged,
previously, 0.5 cm

Right lobe: Enlarged measuring 7.0 x 2.2 x 2.7 cm, unchanged,
previously, 8.2 x 1.9 x 2.5 cm

Left lobe: Enlarge measuring 6.4 x 1.7 x 2.2 cm, unchanged,
previously, 6.0 x 1.6 x 2.5 cm

_________________________________________________________

Estimated total number of nodules >/= 1 cm: 0

Number of spongiform nodules >/=  2 cm not described below (TR1): 0

Number of mixed cystic and solid nodules >/= 1.5 cm not described
below (TR2): 0

_________________________________________________________

No discrete nodules are seen within the thyroid gland.
IMPRESSION: Grossly unchanged enlarged, hyperemic and moderately heterogeneous
appearing thyroid gland without discrete nodule or mass. Findings
are nonspecific though could be seen in the setting of a chronic
thyroiditis. Clinical correlation is advised.

## 2017-04-21 ENCOUNTER — Ambulatory Visit
Admission: RE | Admit: 2017-04-21 | Discharge: 2017-04-21 | Disposition: A | Discharge status: Home / Self Care | Payer: 59 | Source: Ambulatory Visit | Attending: Endocrinology | Admitting: Endocrinology

## 2017-04-21 DIAGNOSIS — E049 Nontoxic goiter, unspecified: Secondary | ICD-10-CM

## 2018-03-18 ENCOUNTER — Other Ambulatory Visit: Payer: Self-pay | Admitting: Endocrinology

## 2018-03-18 DIAGNOSIS — E049 Nontoxic goiter, unspecified: Secondary | ICD-10-CM

## 2018-03-24 ENCOUNTER — Ambulatory Visit
Admission: RE | Admit: 2018-03-24 | Discharge: 2018-03-24 | Disposition: A | Discharge status: Home / Self Care | Payer: 59 | Source: Ambulatory Visit | Attending: Endocrinology | Admitting: Endocrinology

## 2018-03-24 DIAGNOSIS — E049 Nontoxic goiter, unspecified: Secondary | ICD-10-CM

## 2018-05-03 ENCOUNTER — Ambulatory Visit (INDEPENDENT_AMBULATORY_CARE_PROVIDER_SITE_OTHER): Payer: 59 | Admitting: Family Medicine

## 2018-05-03 ENCOUNTER — Encounter: Payer: Self-pay | Admitting: Family Medicine

## 2018-05-03 VITALS — BP 120/82 | HR 67 | Temp 98.2°F | Ht 67.5 in | Wt 165.2 lb

## 2018-05-03 DIAGNOSIS — Z7689 Persons encountering health services in other specified circumstances: Secondary | ICD-10-CM

## 2018-05-03 DIAGNOSIS — F410 Panic disorder [episodic paroxysmal anxiety] without agoraphobia: Secondary | ICD-10-CM | POA: Diagnosis not present

## 2018-05-03 DIAGNOSIS — F411 Generalized anxiety disorder: Secondary | ICD-10-CM

## 2018-05-03 DIAGNOSIS — E079 Disorder of thyroid, unspecified: Secondary | ICD-10-CM | POA: Diagnosis not present

## 2018-05-03 DIAGNOSIS — G8929 Other chronic pain: Secondary | ICD-10-CM

## 2018-05-03 DIAGNOSIS — M79671 Pain in right foot: Secondary | ICD-10-CM | POA: Diagnosis not present

## 2018-05-03 DIAGNOSIS — M545 Low back pain: Secondary | ICD-10-CM | POA: Diagnosis not present

## 2018-05-03 NOTE — Patient Instructions (Signed)

## 2018-05-03 NOTE — Progress Notes (Signed)
   Subjective:    Patient ID: Sydney Moody, female    DOB: 1956-06-27, 61 y.o.   MRN: [id number]  HPI Chief Complaint  Patient presents with  . other    establish care   She is new to the practice and here to establish care. Previous medical care: [facility]- Dr. Sabra Heck   Osteoporosis- is not treating this with traditional medication. States her chiropractor advised her to try Bio-dent. She states this is more "natural".   Other providers: Endocrinologist - Dr. Chalmers Cater  Chiropractor- Claude Manges   States she is under the care of Dr. Chalmers Cater for thyroid disorder. taking levothyroxine   Anxiety and panic disorder: does not have a therapist.  States she usually only needs 30 alprazolam or fewer per year typically and is under contract with [facility] for this medication. No records with her today.   She has multiple complaints that have apparently been chronic.  Numbness in right heel for many years.  Reports history of chronic ow back back. Pain at times radiating down the side of her right leg for years. States she does sretches for hamstring. States she has been to  PT in the past.  She was apparently scheduled for an MRI in the past as well as other testing but did not get this done. States she has not been good about getting things done in the past.   States she wants a sleep study due to having trouble sleeping and waking up gasping for air. This is not new and has been ongoing.   Denies fever, chills, dizziness, unexplained weight change, chest pain, palpitations, shortness of breath, abdominal pain, N/V/D.    LMP: age 8   Reviewed allergies, medications, past medical, surgical, family, and social history.   Review of Systems Pertinent positives and negatives in the history of present illness.     Objective:   Physical Exam BP 120/82 (BP Location: Left Arm, Patient Position: Sitting)   Pulse 67   Temp 98.2 F (36.8 C)   Ht 5' 7.5" (1.715 m)   Wt 165 lb 3.2 oz  (74.9 kg)   SpO2 99%   BMI 25.49 kg/m   Alert and oriented and in no acute distress. Not otherwise examined.       Assessment & Plan:  Anxiety state  Panic disorder  Thyroid disorder  Encounter to establish care  Chronic low back pain, unspecified back pain laterality, with sciatica presence unspecified  Chronic pain of right heel  She reports being in a contact with her PCP at [facility] for Xanax. She has a prescription bottle with her today with several tablets. I will get records from [place]. Discussed counseling for anxiety. She does not appear to be interested.  Under the care of Dr. Chalmers Cater for thyroid disorder.  Chronic pain and apparently has not had this worked up in the past. Will get records.  Requests sleep study. We will discuss this at her follow up appointment once I have her medical records.

## 2018-05-18 ENCOUNTER — Telehealth: Payer: Self-pay | Admitting: Family Medicine

## 2018-05-18 NOTE — Telephone Encounter (Signed)
Received medical records from [facility]. Included in those records is TDAP info, mammogram, Bone density and labs. Those have been flagged in the records that are being sent back for review.

## 2018-05-28 ENCOUNTER — Encounter: Payer: Self-pay | Admitting: Family Medicine

## 2018-05-28 DIAGNOSIS — E7800 Pure hypercholesterolemia, unspecified: Secondary | ICD-10-CM

## 2018-05-28 DIAGNOSIS — M810 Age-related osteoporosis without current pathological fracture: Secondary | ICD-10-CM

## 2018-05-28 HISTORY — DX: Pure hypercholesterolemia, unspecified: E78.00

## 2018-05-28 HISTORY — DX: Age-related osteoporosis without current pathological fracture: M81.0

## 2018-05-31 ENCOUNTER — Encounter: Payer: Self-pay | Admitting: Family Medicine

## 2018-06-01 ENCOUNTER — Encounter: Payer: Self-pay | Admitting: Internal Medicine

## 2018-07-13 ENCOUNTER — Other Ambulatory Visit: Payer: Self-pay | Admitting: Family Medicine

## 2018-07-13 DIAGNOSIS — Z1231 Encounter for screening mammogram for malignant neoplasm of breast: Secondary | ICD-10-CM

## 2018-07-13 DIAGNOSIS — M810 Age-related osteoporosis without current pathological fracture: Secondary | ICD-10-CM

## 2018-09-10 ENCOUNTER — Ambulatory Visit
Admission: RE | Admit: 2018-09-10 | Discharge: 2018-09-10 | Disposition: A | Discharge status: Home / Self Care | Payer: 59 | Source: Ambulatory Visit | Attending: Family Medicine | Admitting: Family Medicine

## 2018-09-10 DIAGNOSIS — Z1231 Encounter for screening mammogram for malignant neoplasm of breast: Secondary | ICD-10-CM

## 2018-09-10 DIAGNOSIS — M810 Age-related osteoporosis without current pathological fracture: Secondary | ICD-10-CM

## 2018-09-10 IMAGING — MG DIGITAL SCREENING BILATERAL MAMMOGRAM WITH TOMO AND CAD
8 series · 8 of 24 positions shown · non-contrast
Comparison: Previous exam(s).

CLINICAL DATA: Screening.

EXAM:
DIGITAL SCREENING BILATERAL MAMMOGRAM WITH TOMO AND CAD

[L MLO synth-2D]
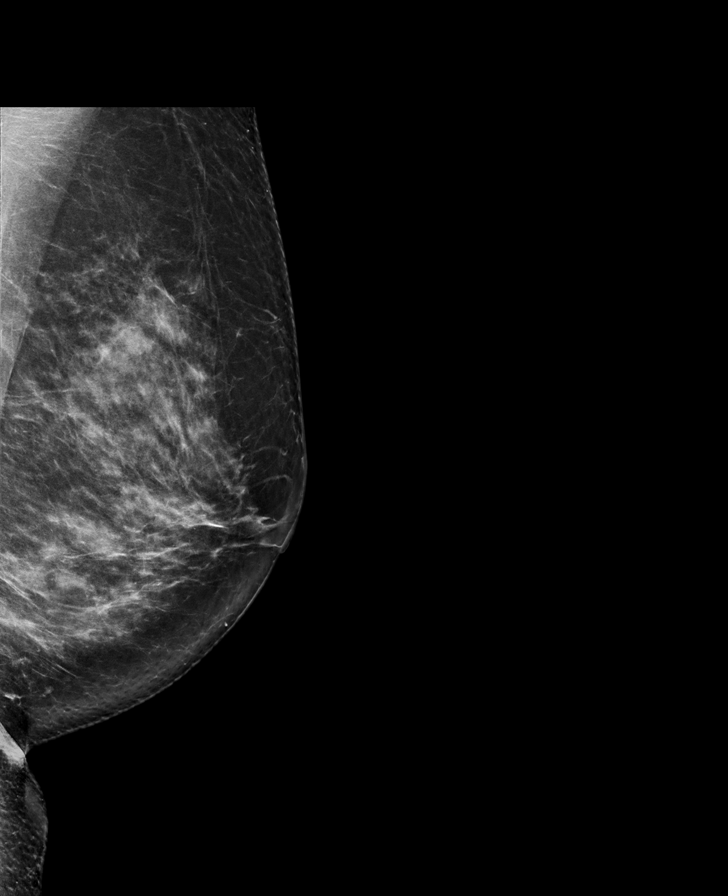

[R CC synth-2D]
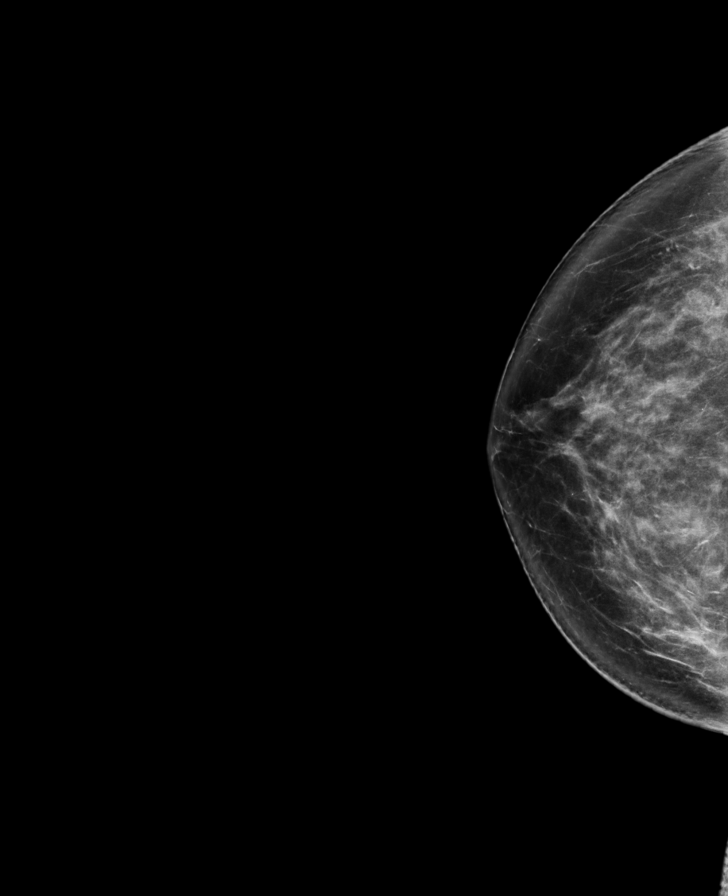

[R MLO synth-2D]
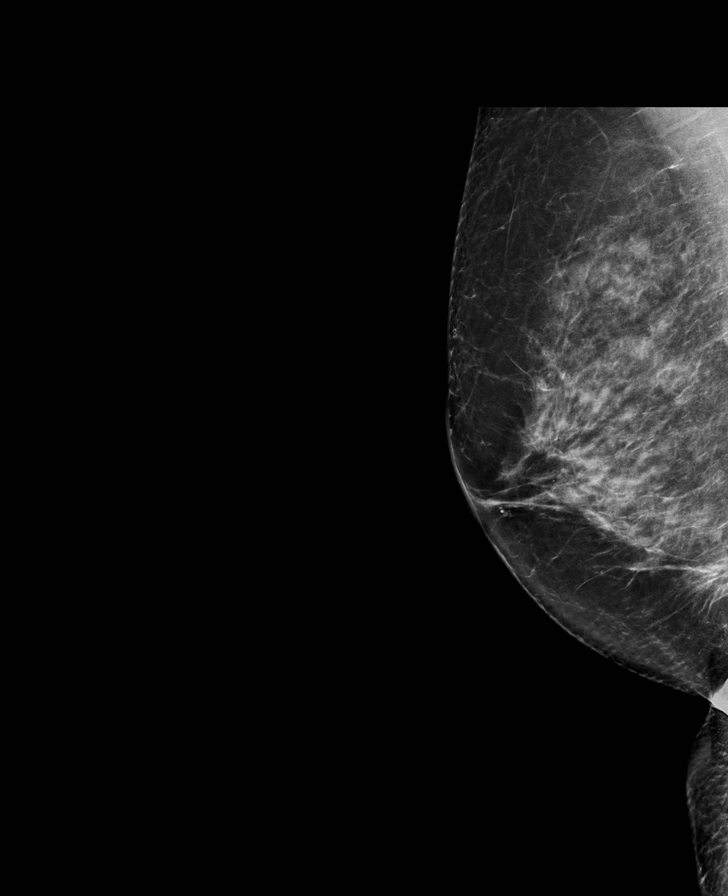

[L CC synth-2D]
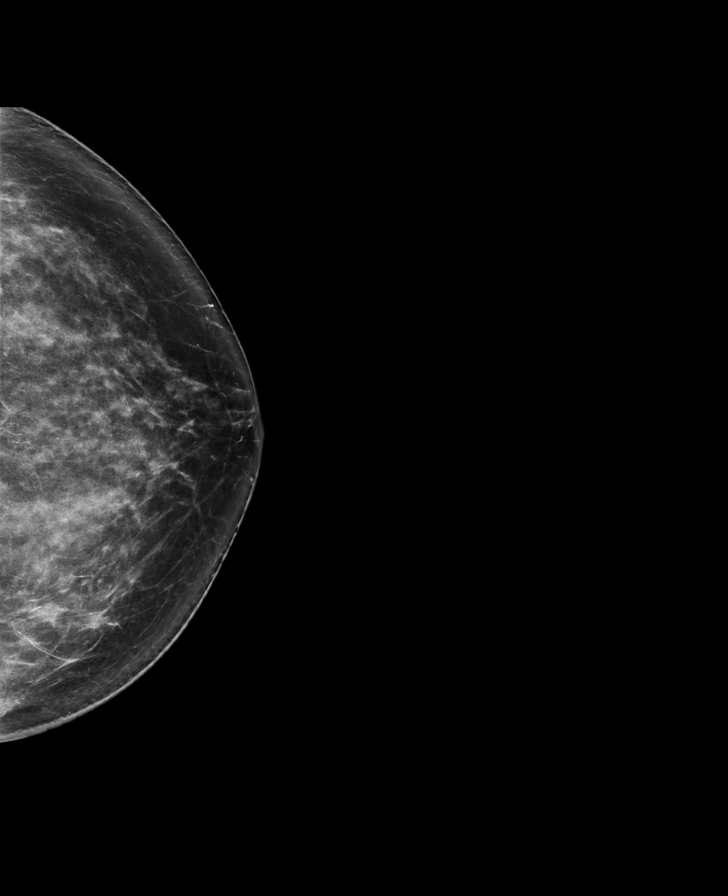

[L CC tomo · tomo slice 43/85.0]
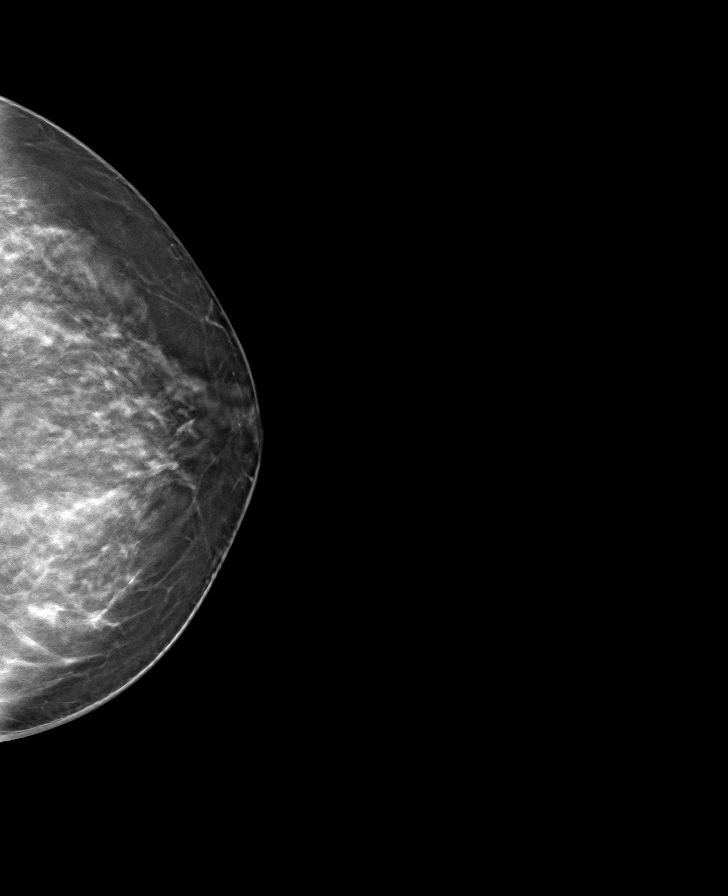

[R MLO tomo · tomo slice 43/84.0]
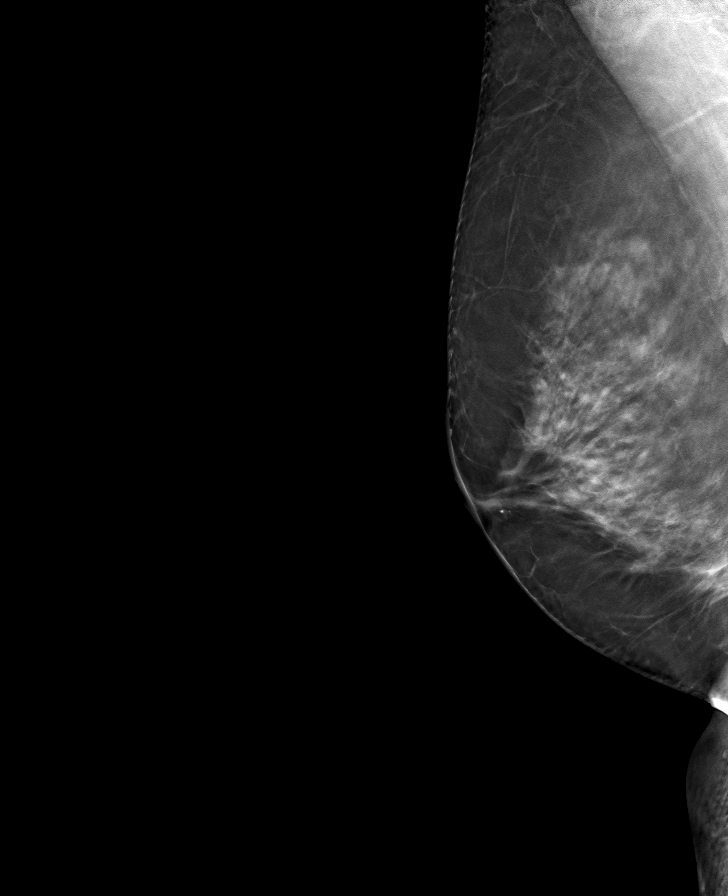

[R CC tomo · tomo slice 44/87.0]
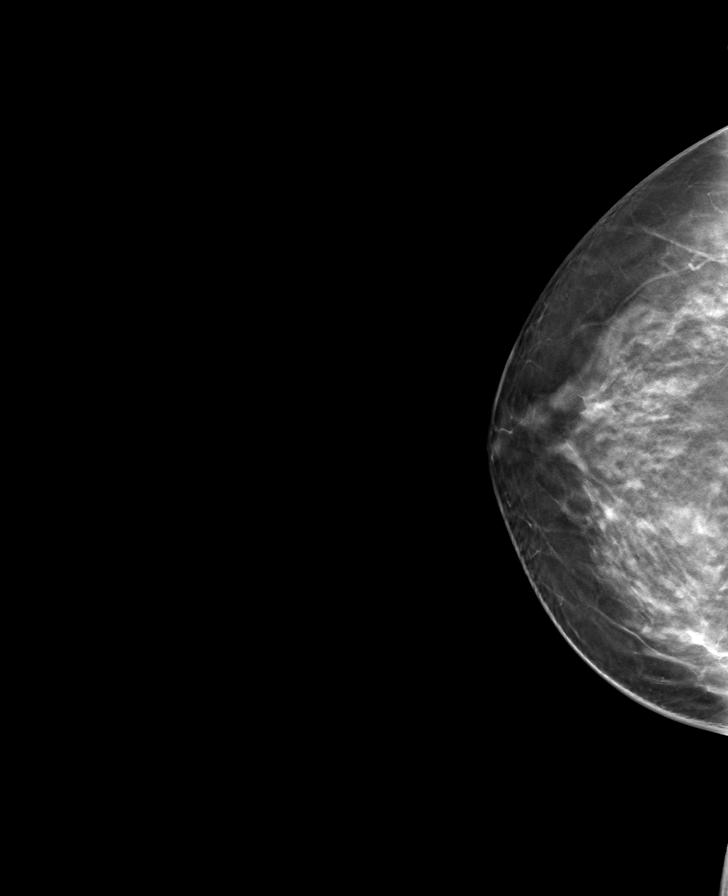

[L MLO tomo · tomo slice 43/84.0]
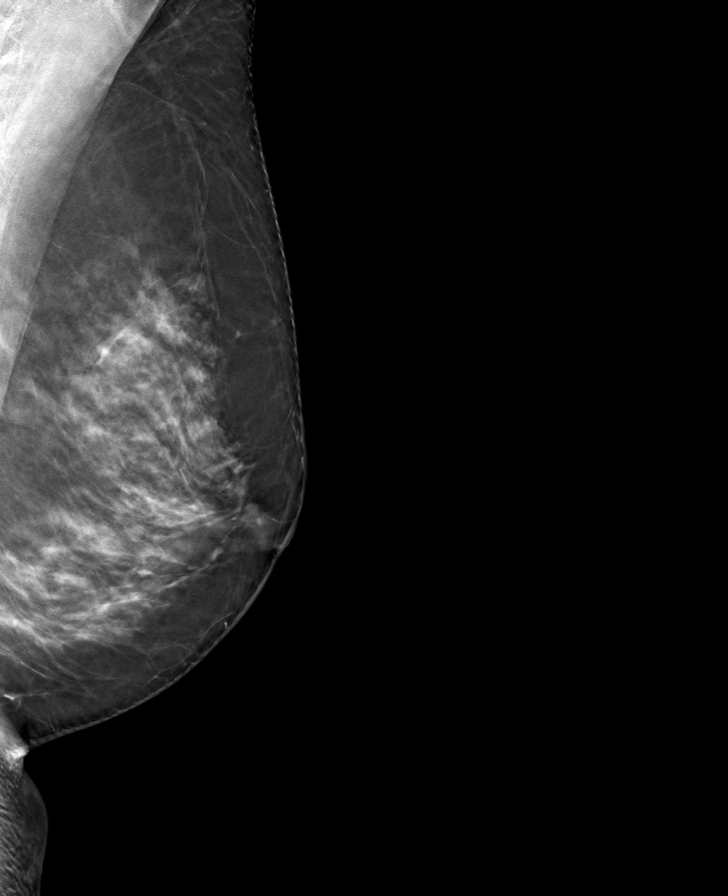

[8 of 24 positions shown; findings below may reference images not displayed]

ACR Breast Density Category c: The breast tissue is heterogeneously
dense, which may obscure small masses.
FINDINGS: There are no findings suspicious for malignancy. Images were
processed with CAD.
IMPRESSION: No mammographic evidence of malignancy. A result letter of this
screening mammogram will be mailed directly to the patient.

RECOMMENDATION:
Screening mammogram in one year. (Code:[5V])

BI-RADS CATEGORY  1: Negative.

## 2019-05-12 ENCOUNTER — Encounter: Payer: Self-pay | Admitting: Gastroenterology

## 2019-08-11 ENCOUNTER — Other Ambulatory Visit: Payer: Self-pay | Admitting: Family Medicine

## 2019-08-16 ENCOUNTER — Other Ambulatory Visit: Payer: Self-pay | Admitting: Family Medicine

## 2019-08-16 DIAGNOSIS — Z1231 Encounter for screening mammogram for malignant neoplasm of breast: Secondary | ICD-10-CM

## 2019-10-05 ENCOUNTER — Ambulatory Visit
Admission: RE | Admit: 2019-10-05 | Discharge: 2019-10-05 | Disposition: A | Discharge status: Home / Self Care | Payer: Managed Care, Other (non HMO) | Source: Ambulatory Visit | Attending: Family Medicine | Admitting: Family Medicine

## 2019-10-05 ENCOUNTER — Other Ambulatory Visit: Payer: Self-pay

## 2019-10-05 DIAGNOSIS — Z1231 Encounter for screening mammogram for malignant neoplasm of breast: Secondary | ICD-10-CM

## 2019-10-05 IMAGING — MG DIGITAL SCREENING BILAT W/ TOMO W/ CAD
8 series · 8 of 24 positions shown · non-contrast
Comparison: Previous exam(s).

CLINICAL DATA: Screening.

EXAM:
DIGITAL SCREENING BILATERAL MAMMOGRAM WITH TOMO AND CAD

[L CC synth-2D]
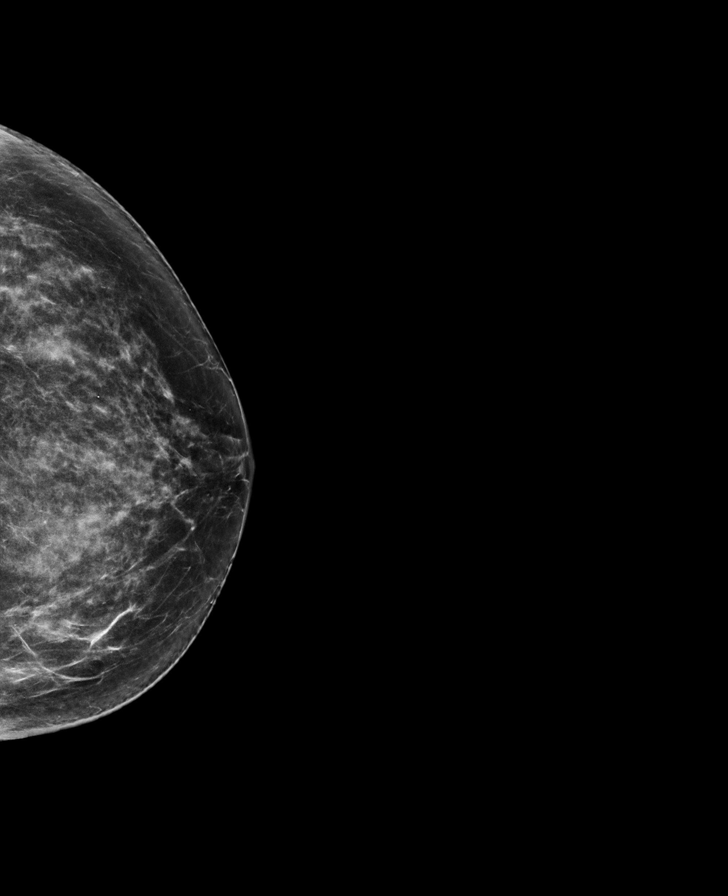

[L MLO synth-2D]
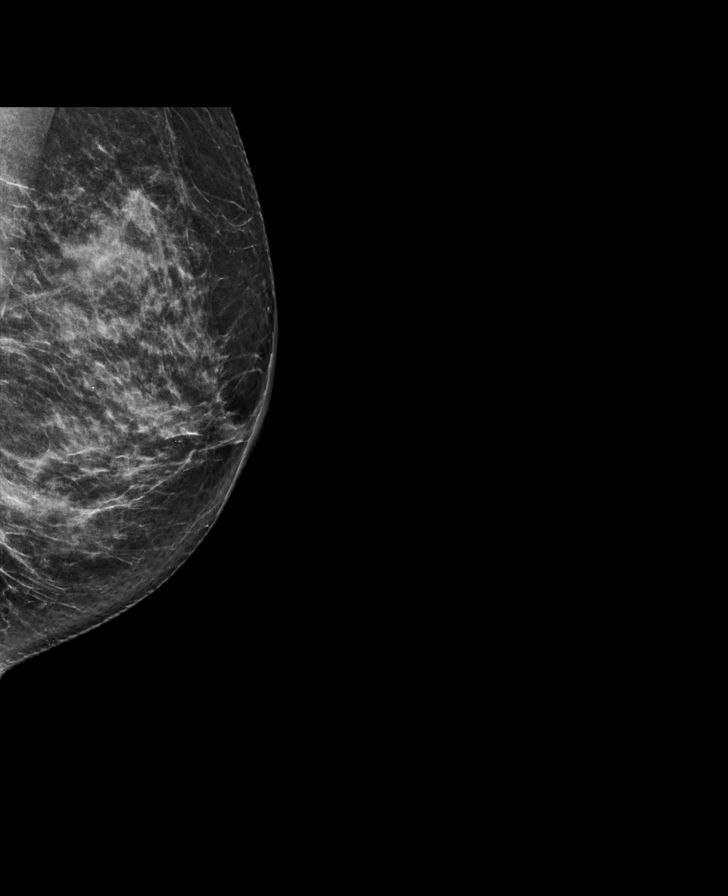

[R CC synth-2D]
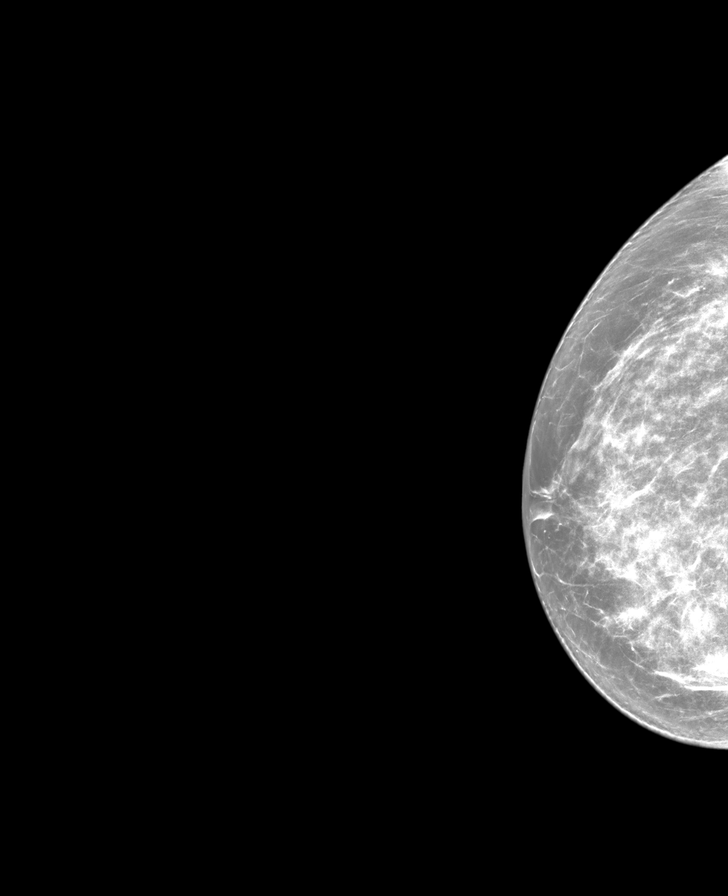

[R MLO synth-2D]
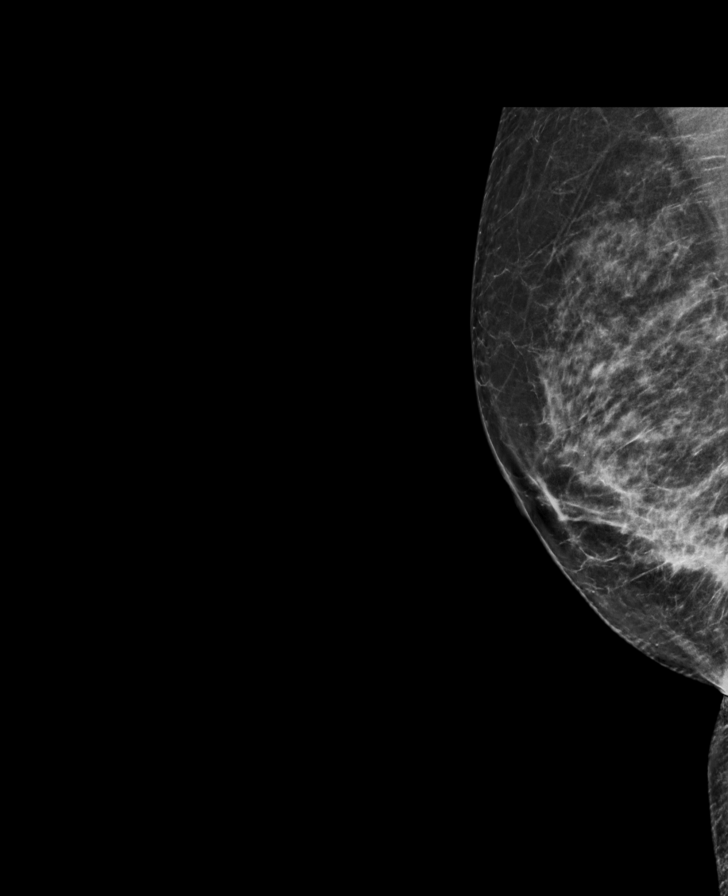

[L CC tomo · tomo slice 37/72.0]
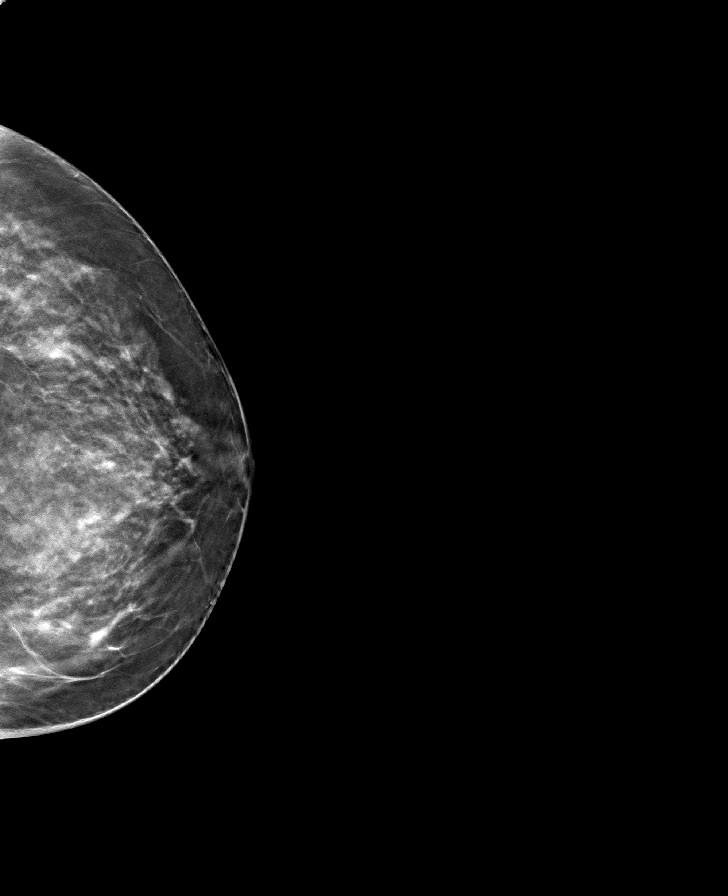

[R MLO tomo · tomo slice 37/72.0]
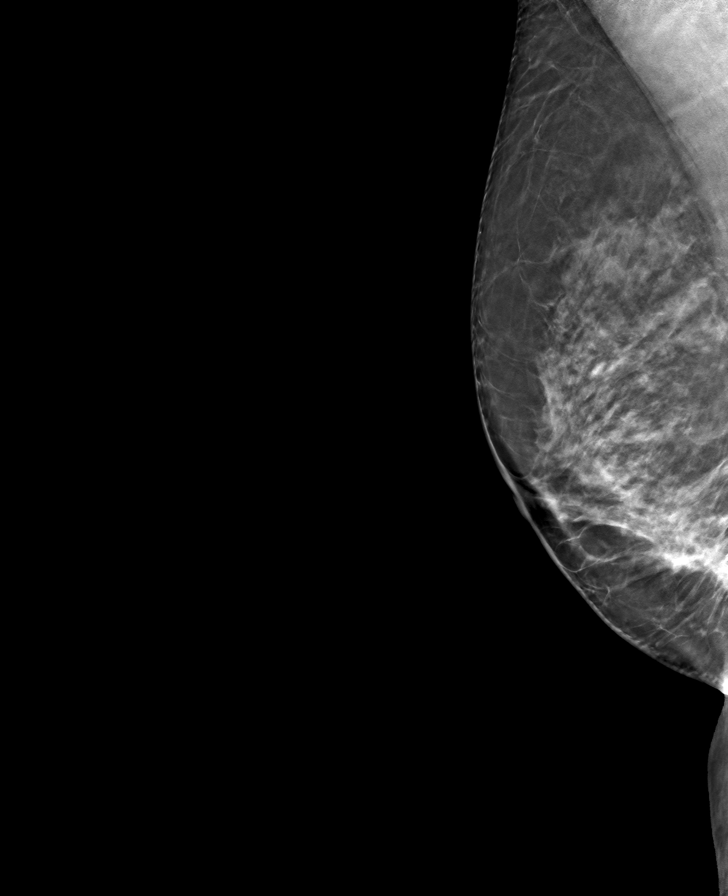

[R CC tomo · tomo slice 35/70.0]
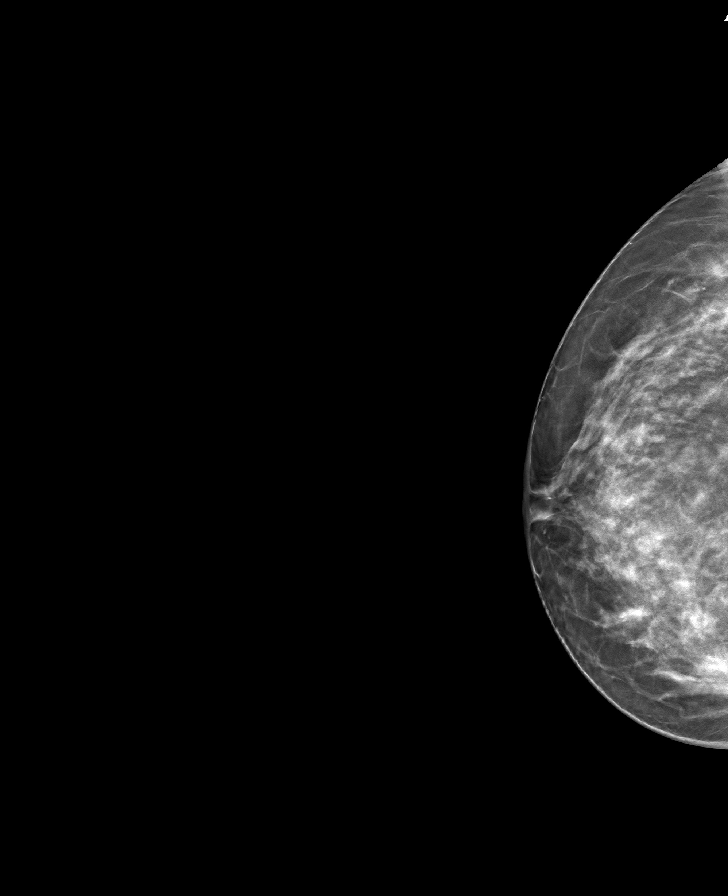

[L MLO tomo · tomo slice 35/70.0]
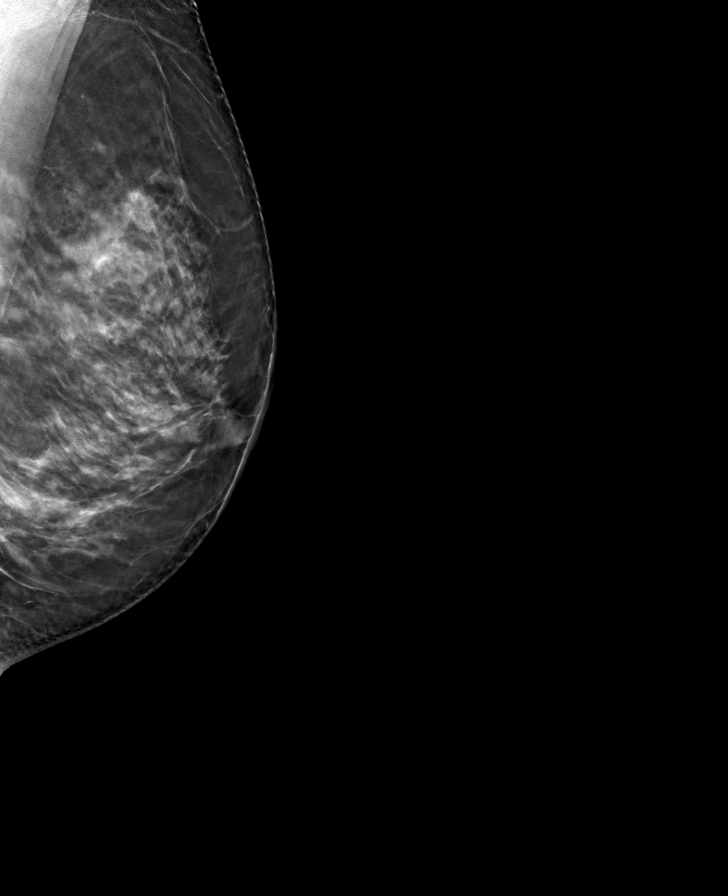

[8 of 24 positions shown; findings below may reference images not displayed]

ACR Breast Density Category c: The breast tissue is heterogeneously
dense, which may obscure small masses.
FINDINGS: In the right breast, possible distortion warrants further
evaluation. In the left breast, no findings suspicious for
malignancy. Images were processed with CAD.
IMPRESSION: Further evaluation is suggested for possible distortion in the right
breast.

RECOMMENDATION:
Diagnostic mammogram and possibly ultrasound of the right breast.
(Code:[WS])

The patient will be contacted regarding the findings, and additional
imaging will be scheduled.

BI-RADS CATEGORY  0: Incomplete. Need additional imaging evaluation
and/or prior mammograms for comparison.

## 2019-10-06 ENCOUNTER — Other Ambulatory Visit: Payer: Self-pay | Admitting: Family Medicine

## 2019-10-06 DIAGNOSIS — R928 Other abnormal and inconclusive findings on diagnostic imaging of breast: Secondary | ICD-10-CM

## 2019-10-18 ENCOUNTER — Other Ambulatory Visit: Payer: Self-pay | Admitting: Family Medicine

## 2019-10-18 ENCOUNTER — Other Ambulatory Visit: Payer: Managed Care, Other (non HMO)

## 2019-10-18 ENCOUNTER — Ambulatory Visit
Admission: RE | Admit: 2019-10-18 | Discharge: 2019-10-18 | Disposition: A | Discharge status: Home / Self Care | Payer: Managed Care, Other (non HMO) | Source: Ambulatory Visit | Attending: Family Medicine | Admitting: Family Medicine

## 2019-10-18 ENCOUNTER — Other Ambulatory Visit: Payer: Self-pay

## 2019-10-18 DIAGNOSIS — R928 Other abnormal and inconclusive findings on diagnostic imaging of breast: Secondary | ICD-10-CM

## 2019-10-18 DIAGNOSIS — N6489 Other specified disorders of breast: Secondary | ICD-10-CM

## 2019-10-18 DIAGNOSIS — N6310 Unspecified lump in the right breast, unspecified quadrant: Secondary | ICD-10-CM

## 2019-10-18 IMAGING — MG MM DIGITAL DIAGNOSTIC UNILAT*R* W/ TOMO W/ CAD
8 series · 8 of 24 positions shown · non-contrast
Comparison: Previous exam(s).

CLINICAL DATA: Patient recalled from screening for right breast
distortion.

EXAM:
DIGITAL DIAGNOSTIC RIGHT MAMMOGRAM WITH CAD AND TOMO
ULTRASOUND RIGHT BREAST

[R CC synth-2D (1 of 2)]
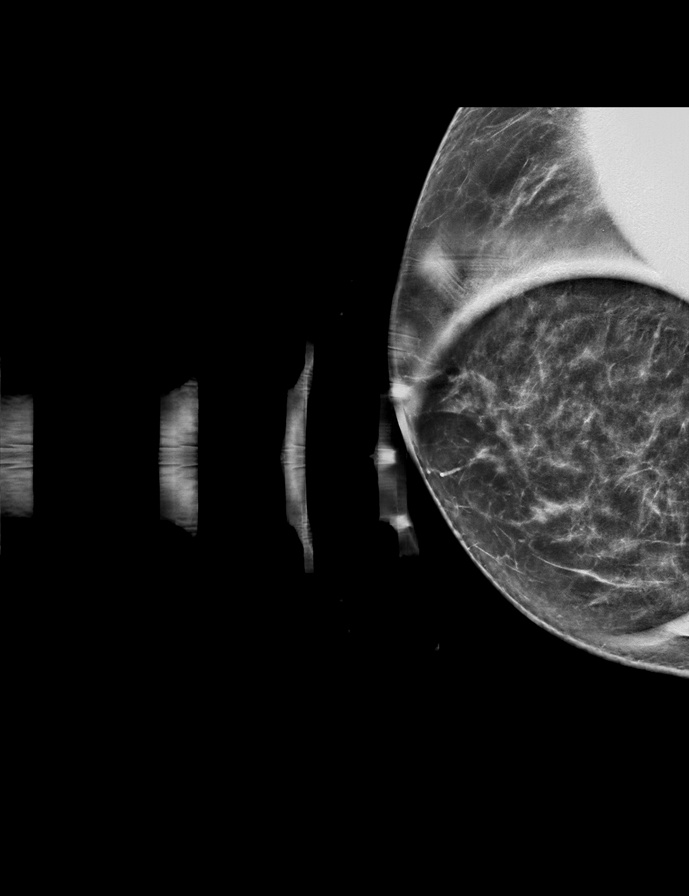

[R CC synth-2D (2 of 2)]
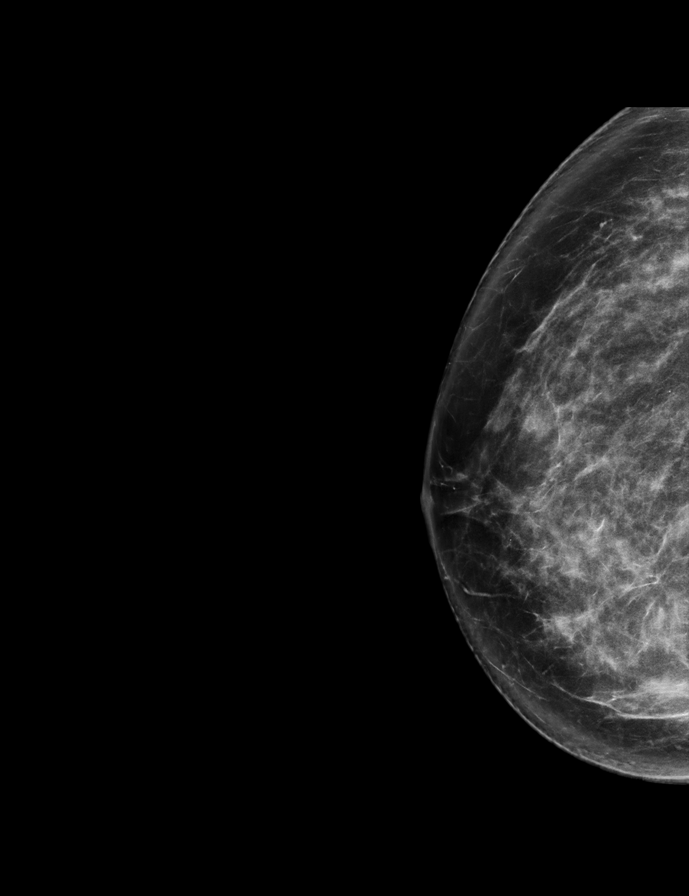

[R MLO synth-2D]
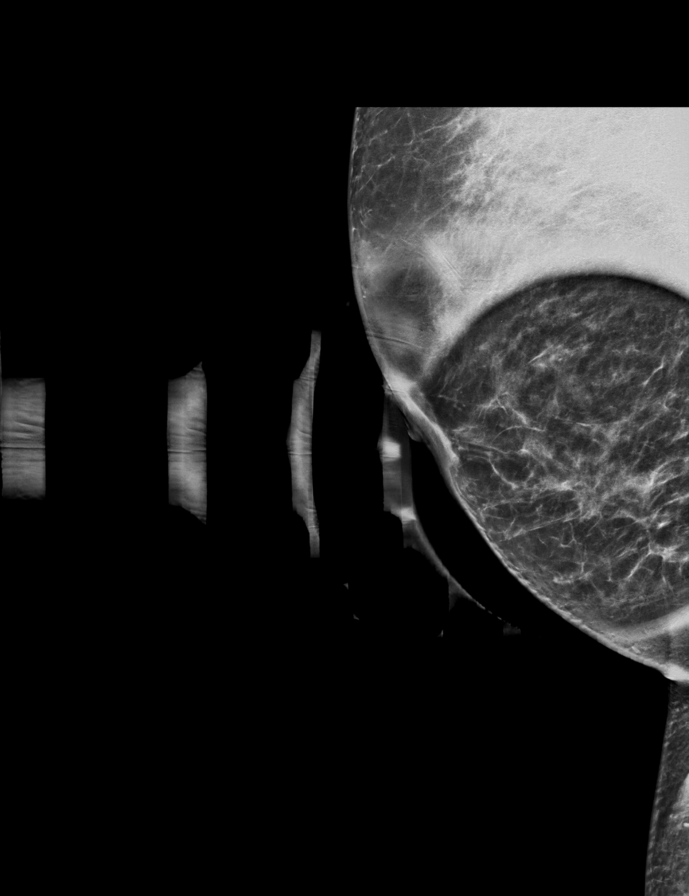

[R ML synth-2D]
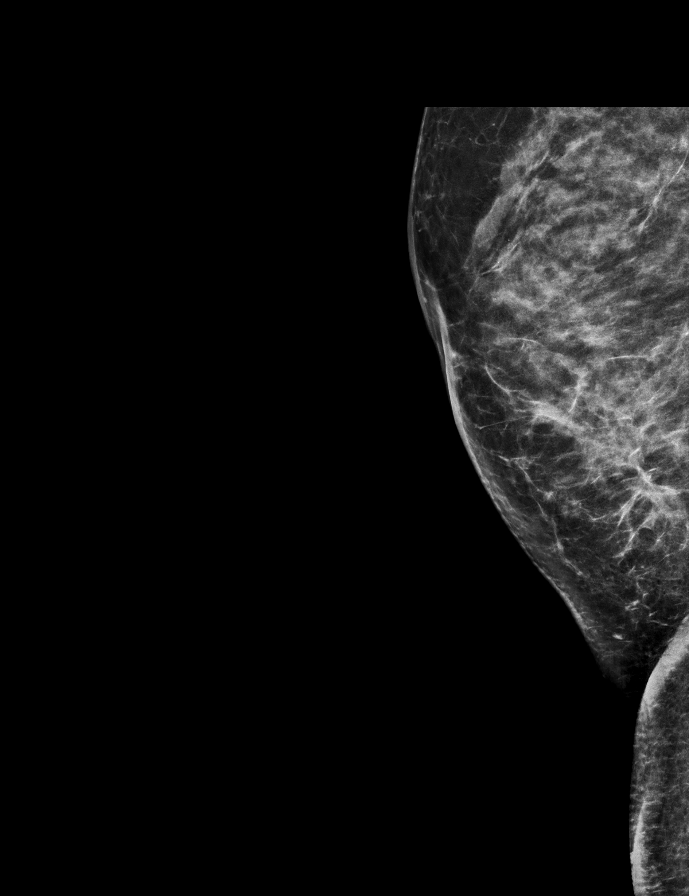

[R CC tomo (1 of 2) · tomo slice 39/77.0]
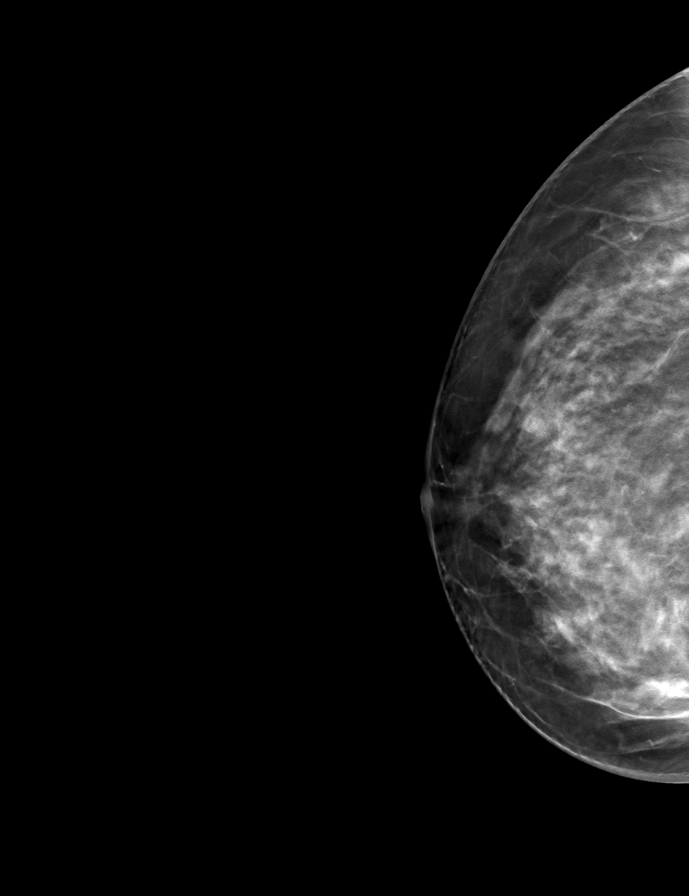

[R ML tomo · tomo slice 35/70.0]
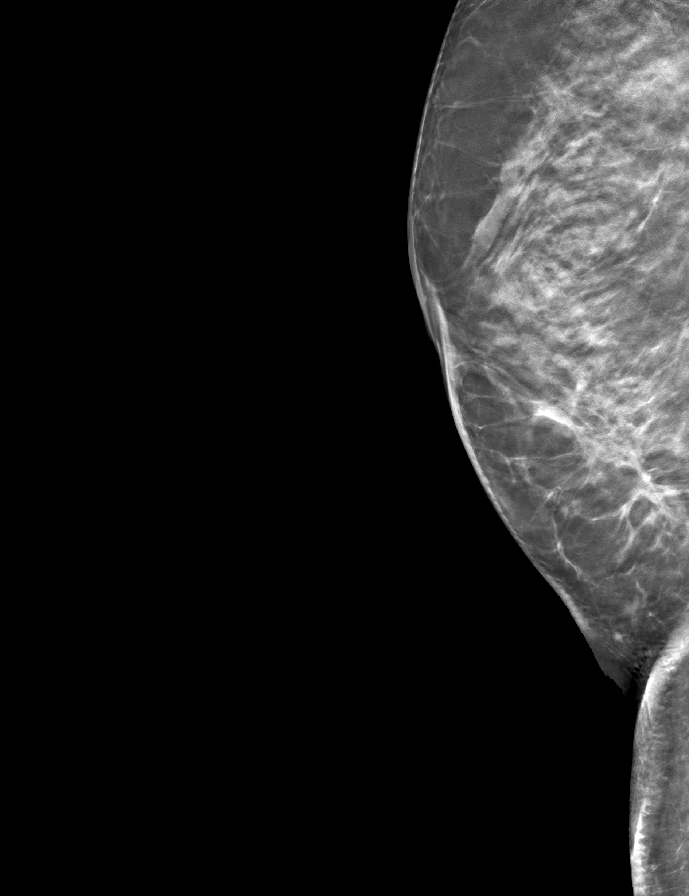

[R MLO tomo · tomo slice 33/64.0]
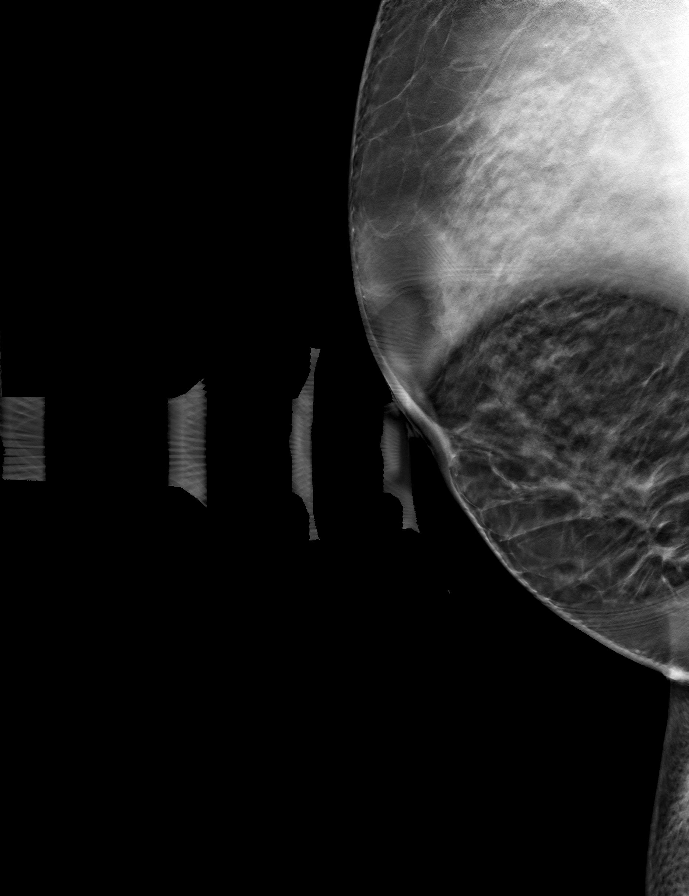

[R CC tomo (2 of 2) · tomo slice 37/74.0]
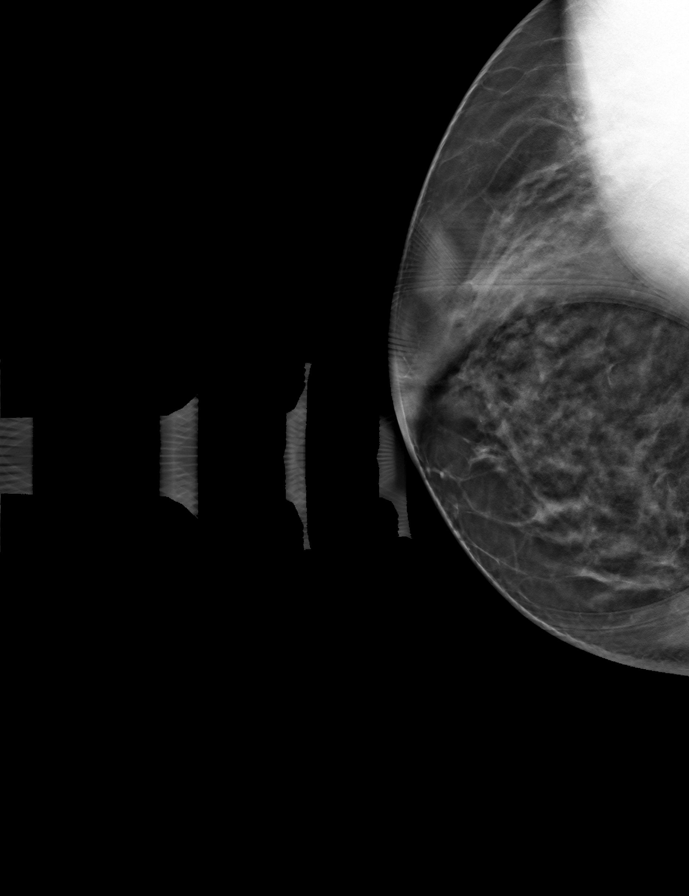

[8 of 24 positions shown; findings below may reference images not displayed]

ACR Breast Density Category c: The breast tissue is heterogeneously
dense, which may obscure small masses.
FINDINGS: Within the lower inner right breast posterior depth there is a
persistent large area of architectural distortion. This was further
evaluated with spot compression CC and MLO tomosynthesis images as
well as full paddle true lateral views of the right breast.

Mammographic images were processed with CAD.

On physical exam, there is a firm mass palpated within the lower
inner right breast.

Targeted ultrasound is performed, showing a 1.7 x 1.5 x 1.8 cm mixed
echogenicity irregular mass right breast 4 o'clock position 4 cm
from nipple. Note there is abnormal appearing tissue surrounding the
dominant mass with posterior acoustical shadowing within the right
breast 4 o'clock position 4 cm from nipple in total measuring 4.9 x
4.3 cm.

No right axillary adenopathy.
IMPRESSION: Suspicious right breast mass concerning for primary breast
malignancy. There is abnormal shadowing tissue surrounding the
dominant mass concerning for a larger extent of disease,
particularly if this represents a lobular carcinoma. In total this
measures up to approximately 5 cm.

RECOMMENDATION:
Ultrasound-guided core needle biopsy right breast mass.

Recommend bilateral breast MRI for evaluation of extent of disease
given the sonographic findings suggestive of a larger extent of
disease.

I have discussed the findings and recommendations with the patient.
If applicable, a reminder letter will be sent to the patient
regarding the next appointment.

BI-RADS CATEGORY  5: Highly suggestive of malignancy.

## 2019-10-18 IMAGING — US US BREAST*R* LIMITED INC AXILLA
2 series · 13 of 13 positions shown · non-contrast
Comparison: Previous exam(s).

CLINICAL DATA: Patient recalled from screening for right breast
distortion.

EXAM:
DIGITAL DIAGNOSTIC RIGHT MAMMOGRAM WITH CAD AND TOMO
ULTRASOUND RIGHT BREAST

[Series 1: us breast*right* limited inc axilla · 0.06mm/px · 9 of 9 slices shown (1 of 2)]
[im 1/9]
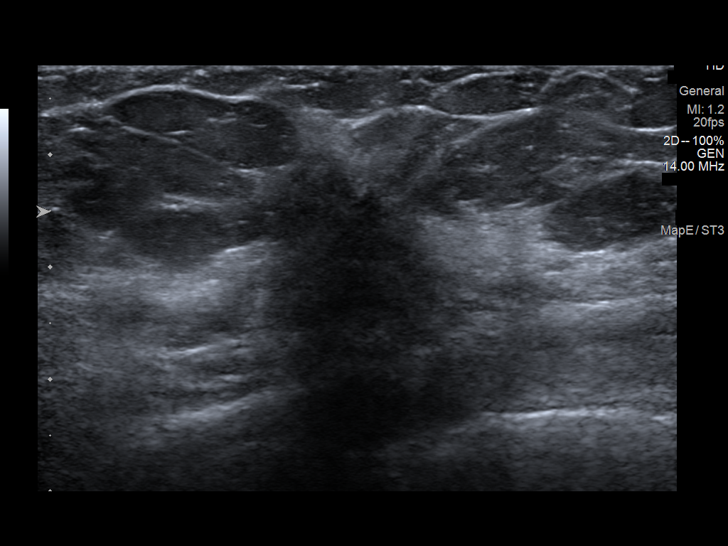
[im 2/9]
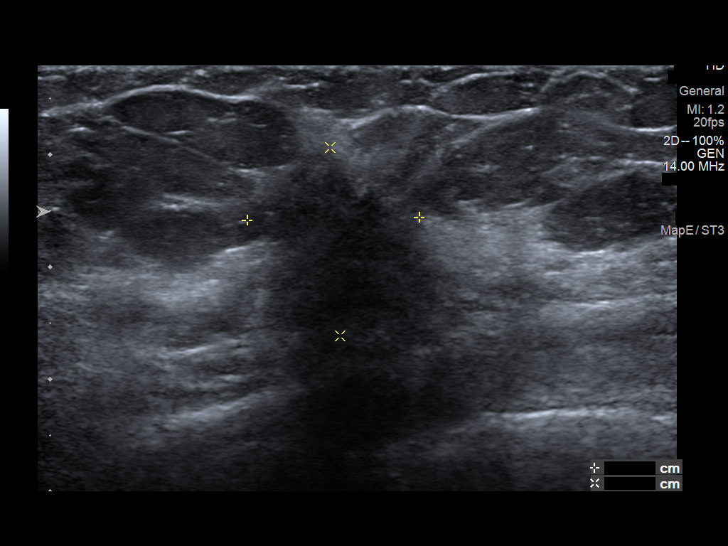
[im 3/9]
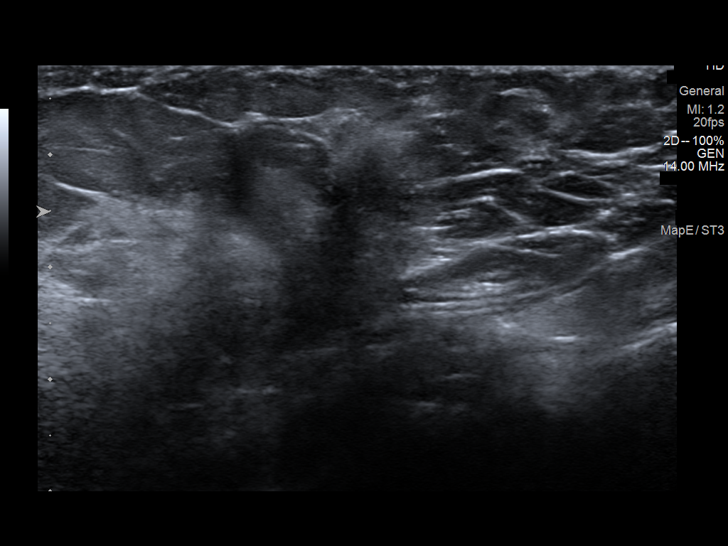
[im 4/9]
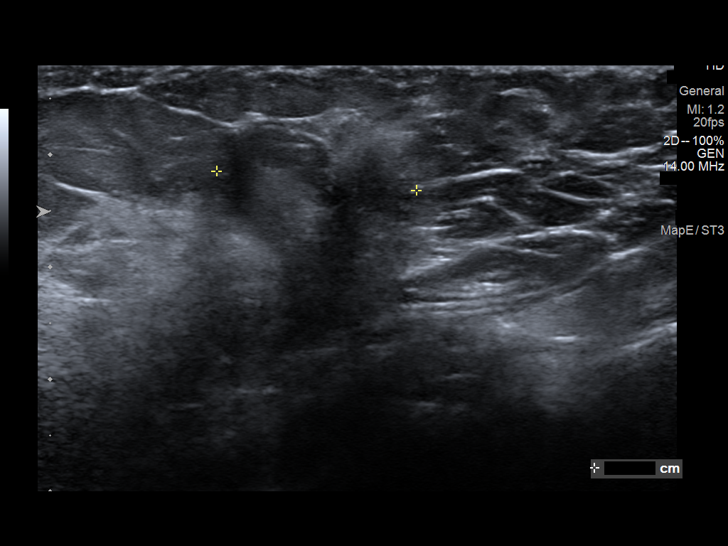
[im 5/9]
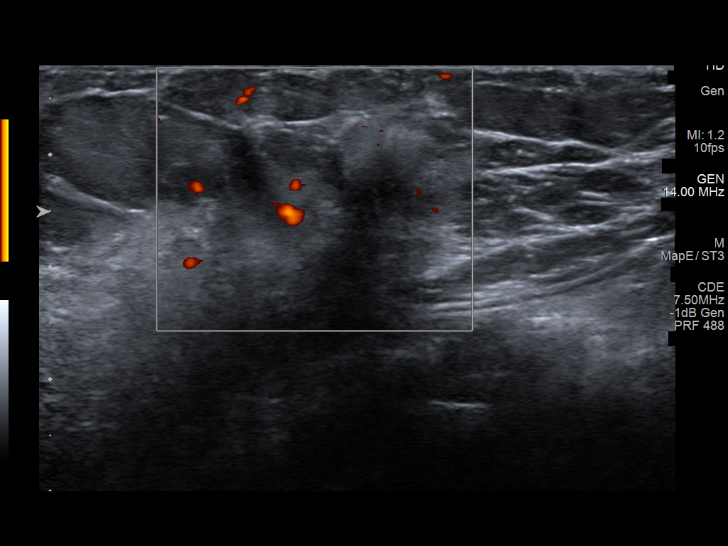
[im 6/9]
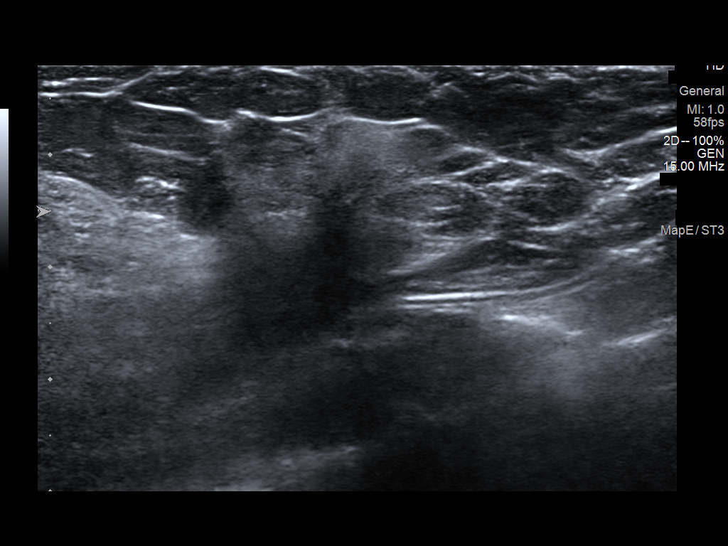
[im 7/9]
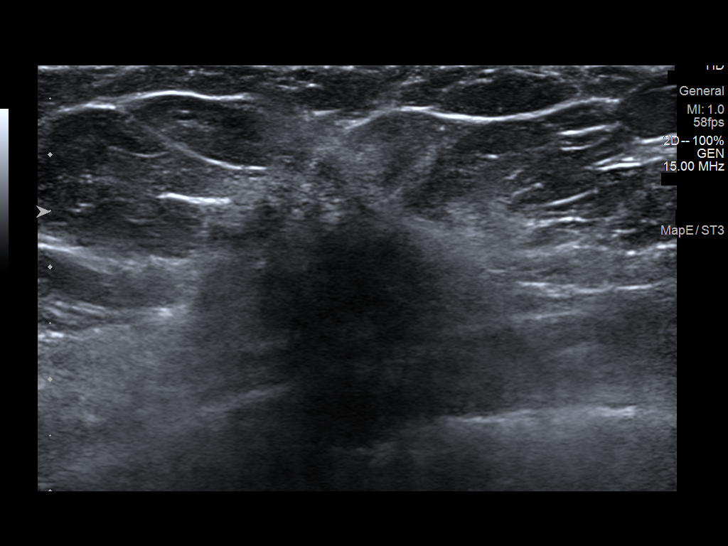
[im 8/9]
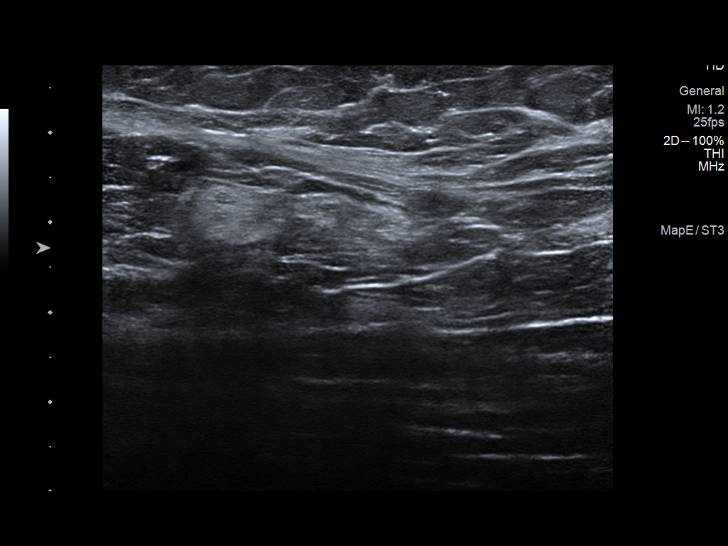
[im 9/9]
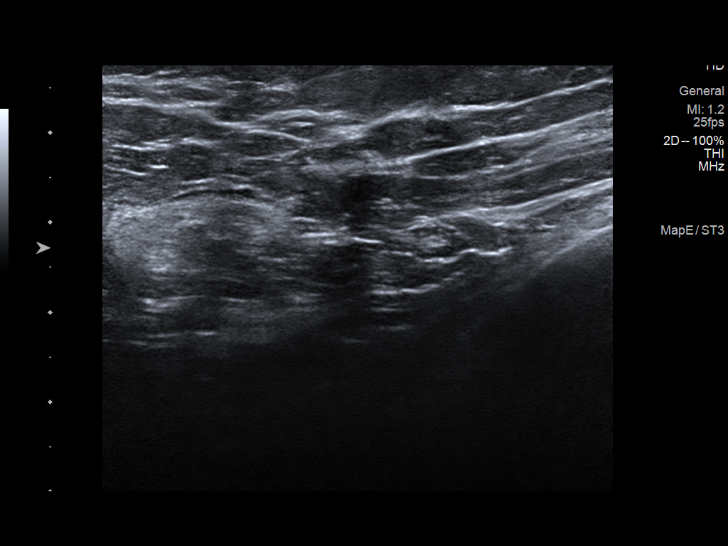

[Series 2: us breast*right* limited inc axilla · 0.07mm/px · 4 of 4 slices shown (2 of 2)]
[im 1/4]
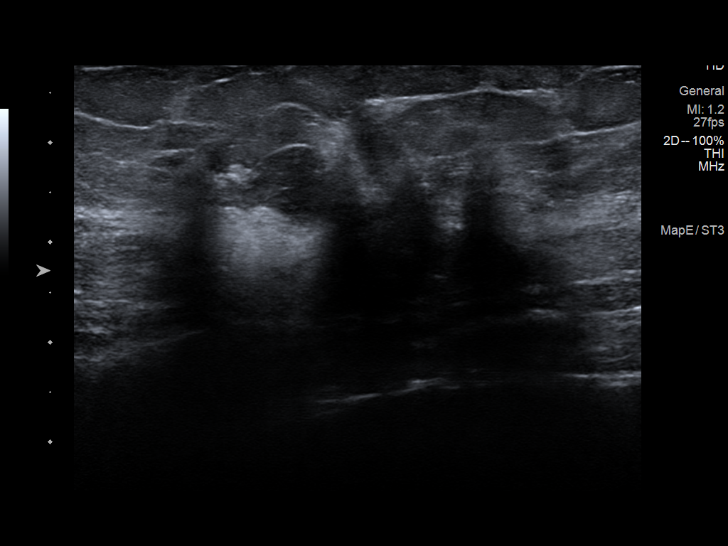
[im 2/4]
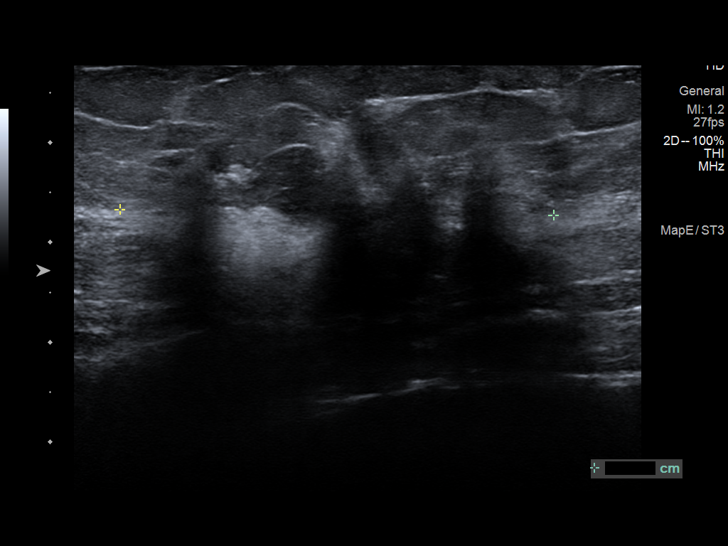
[im 3/4]
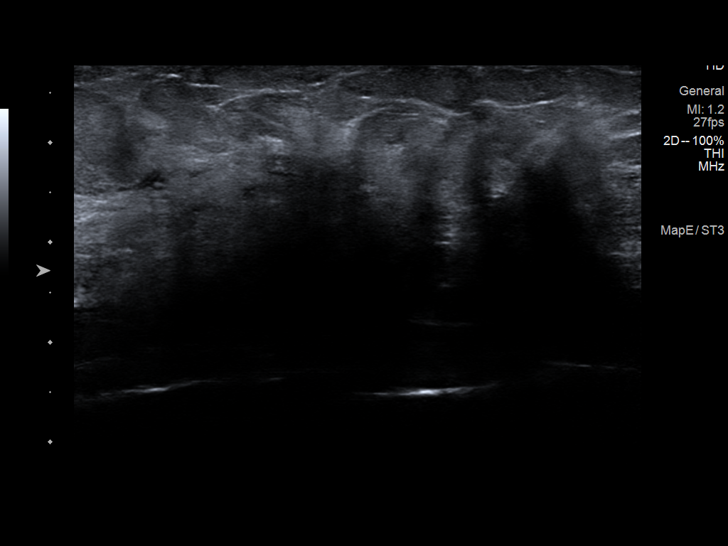
[im 4/4]
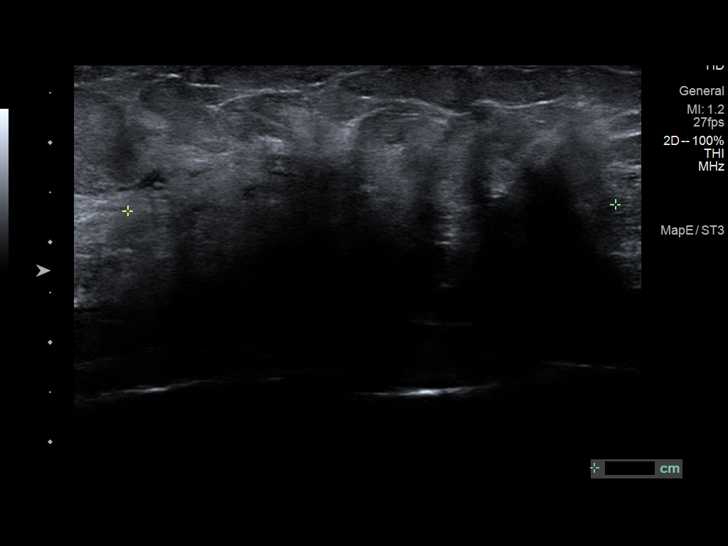

[13 of 13 positions shown; findings below may reference images not displayed]

ACR Breast Density Category c: The breast tissue is heterogeneously
dense, which may obscure small masses.
FINDINGS: Within the lower inner right breast posterior depth there is a
persistent large area of architectural distortion. This was further
evaluated with spot compression CC and MLO tomosynthesis images as
well as full paddle true lateral views of the right breast.

Mammographic images were processed with CAD.

On physical exam, there is a firm mass palpated within the lower
inner right breast.

Targeted ultrasound is performed, showing a 1.7 x 1.5 x 1.8 cm mixed
echogenicity irregular mass right breast 4 o'clock position 4 cm
from nipple. Note there is abnormal appearing tissue surrounding the
dominant mass with posterior acoustical shadowing within the right
breast 4 o'clock position 4 cm from nipple in total measuring 4.9 x
4.3 cm.

No right axillary adenopathy.
IMPRESSION: Suspicious right breast mass concerning for primary breast
malignancy. There is abnormal shadowing tissue surrounding the
dominant mass concerning for a larger extent of disease,
particularly if this represents a lobular carcinoma. In total this
measures up to approximately 5 cm.

RECOMMENDATION:
Ultrasound-guided core needle biopsy right breast mass.

Recommend bilateral breast MRI for evaluation of extent of disease
given the sonographic findings suggestive of a larger extent of
disease.

I have discussed the findings and recommendations with the patient.
If applicable, a reminder letter will be sent to the patient
regarding the next appointment.

BI-RADS CATEGORY  5: Highly suggestive of malignancy.

## 2019-10-27 ENCOUNTER — Ambulatory Visit
Admission: RE | Admit: 2019-10-27 | Discharge: 2019-10-27 | Disposition: A | Discharge status: Home / Self Care | Payer: Managed Care, Other (non HMO) | Source: Ambulatory Visit | Attending: Family Medicine | Admitting: Family Medicine

## 2019-10-27 ENCOUNTER — Other Ambulatory Visit: Payer: Self-pay

## 2019-10-27 DIAGNOSIS — N6489 Other specified disorders of breast: Secondary | ICD-10-CM

## 2019-10-27 DIAGNOSIS — N6310 Unspecified lump in the right breast, unspecified quadrant: Secondary | ICD-10-CM

## 2019-10-27 IMAGING — MG MM BREAST LOCALIZATION CLIP
4 series · 4 of 12 positions shown · non-contrast
Comparison: Previous exam(s).

CLINICAL DATA: Ultrasound-guided biopsy was performed of a mass in
the 4 o'clock axis of the right breast.

EXAM:
DIAGNOSTIC RIGHT MAMMOGRAM POST ULTRASOUND BIOPSY

[R ML synth-2D]
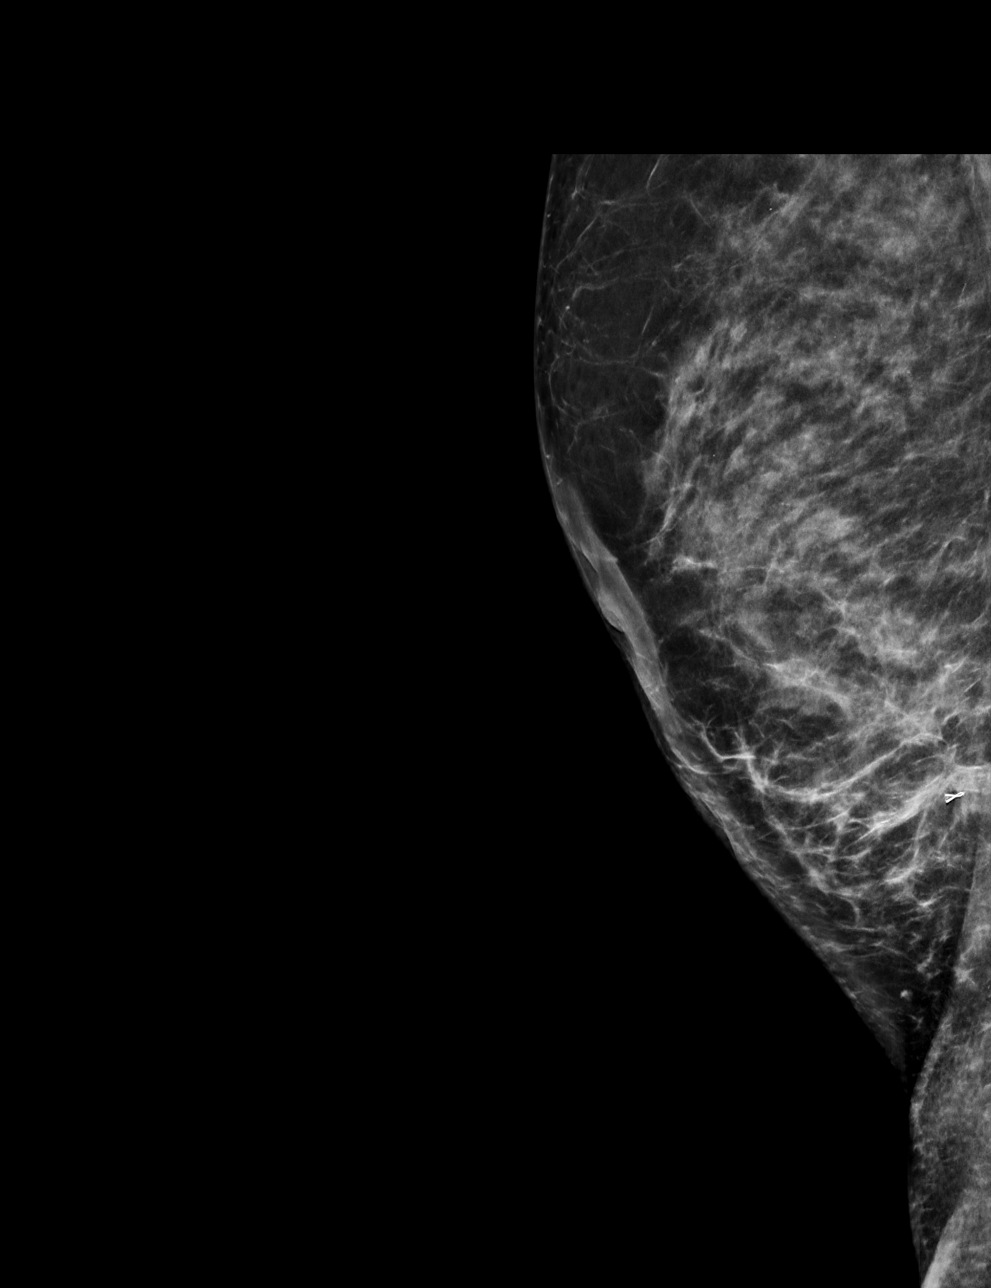

[R CC synth-2D]
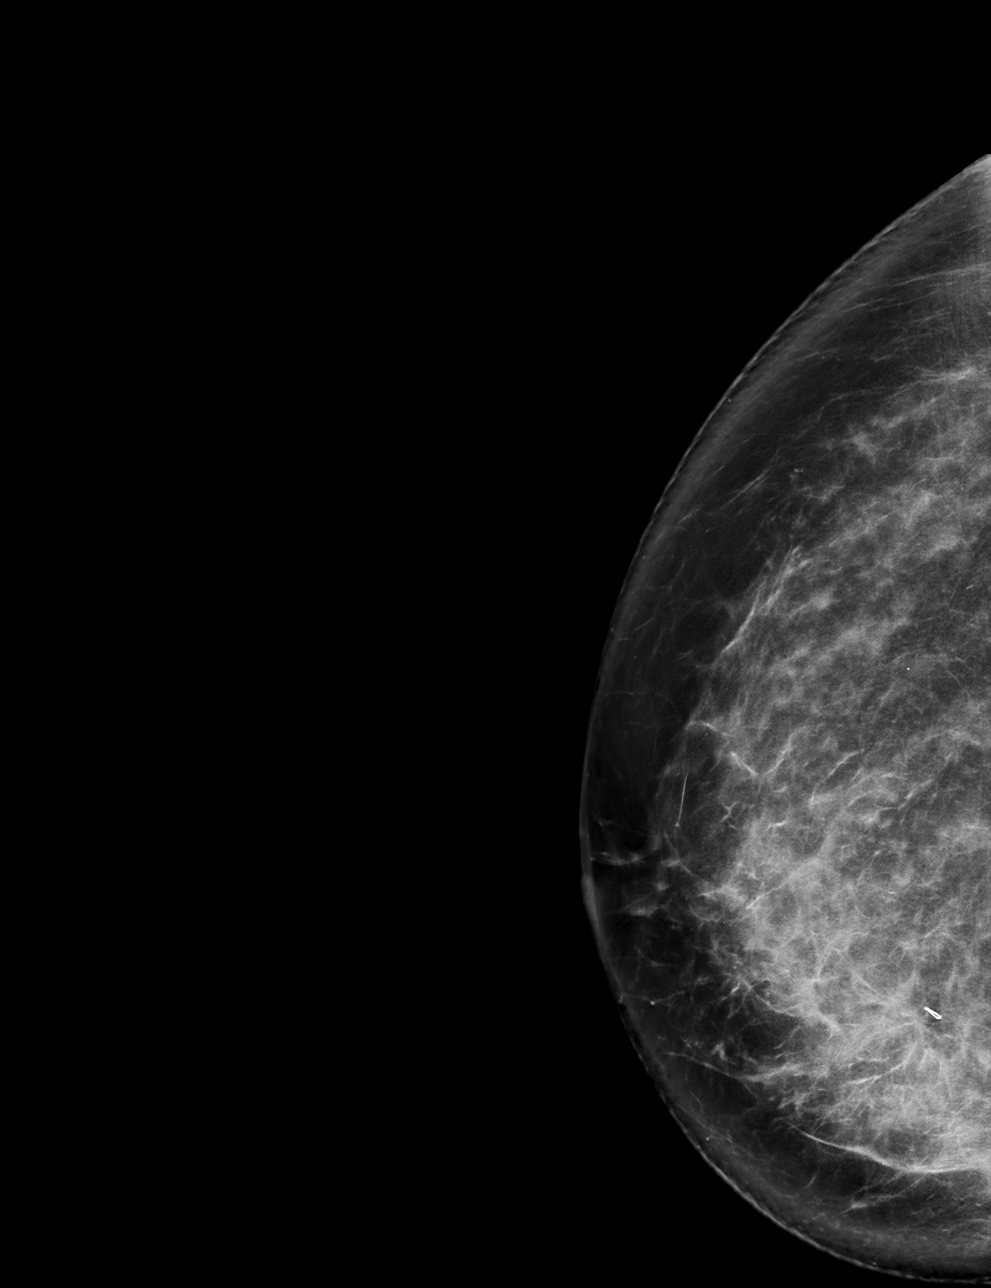

[R ML tomo · tomo slice 37/72.0]
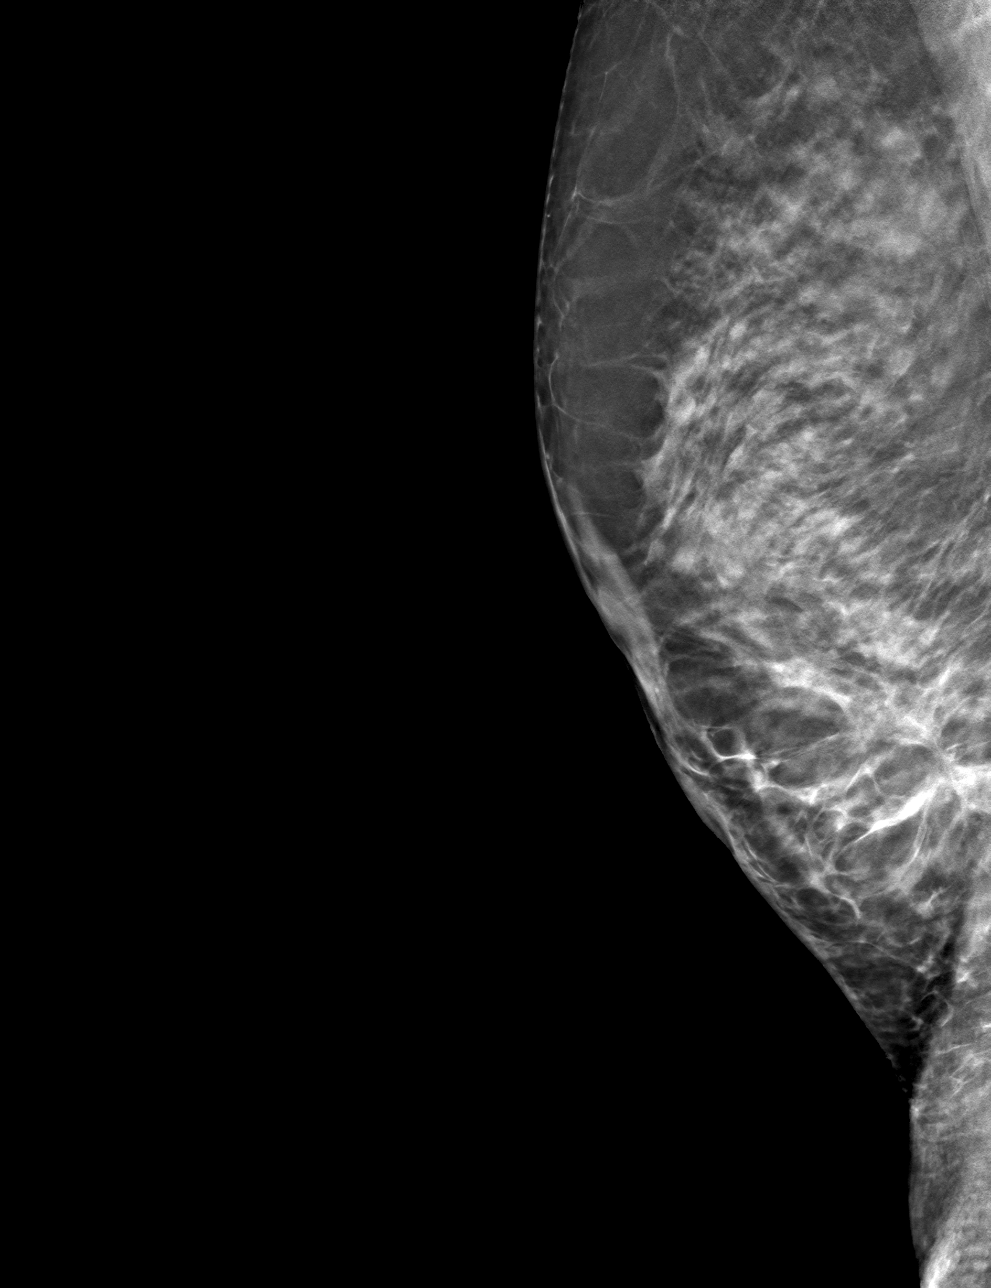

[R CC tomo · tomo slice 41/82.0]
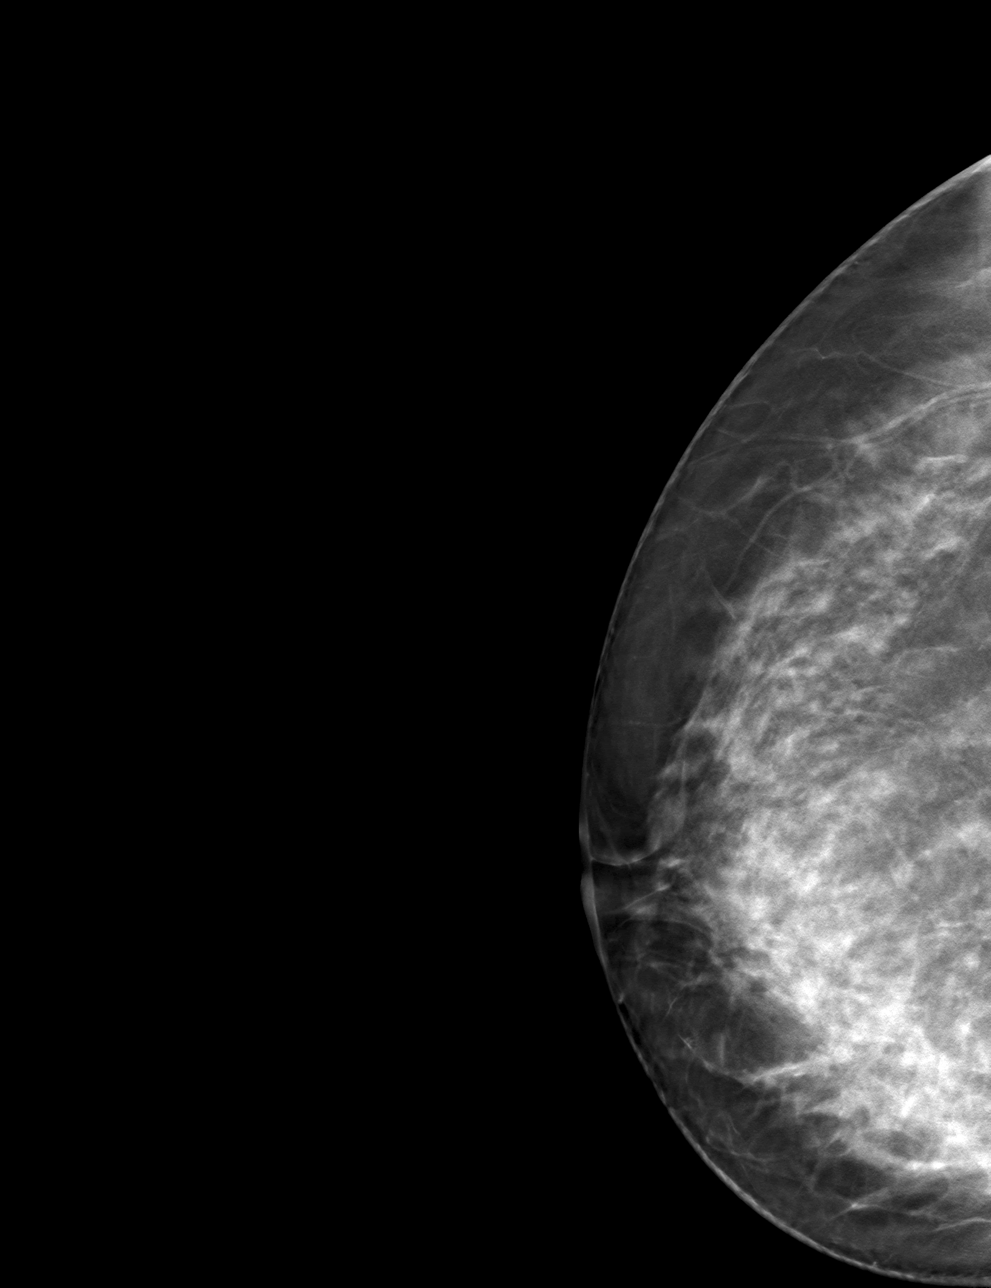

[4 of 12 positions shown; findings below may reference images not displayed]

FINDINGS: Mammographic images were obtained following ultrasound guided biopsy
of the right breast. The biopsy marking clip is in expected position
at the site of biopsy.
IMPRESSION: Appropriate positioning of the ribbon shaped biopsy marking clip at
the site of biopsy in the 4 o'clock position.

Final Assessment: Post Procedure Mammograms for Marker Placement

## 2019-10-27 IMAGING — US US  BREAST BX W/ LOC DEV 1ST LESION IMG BX SPEC US GUIDE*R*
1 series · 12 of 15 positions shown · non-contrast
Comparison: Previous exam(s).
COMPARISON: Previous exam(s).

Addendum:
CLINICAL DATA: Ultrasound-guided core needle biopsy was recommended
of a suspicious palpable mass 4 o'clock position right breast.

EXAM:
ULTRASOUND GUIDED RIGHT BREAST CORE NEEDLE BIOPSY

[Series 1: us breast bx w/ loc dev 1st lesion img bx spec us  · 0.07mm/px · 12 of 15 slices shown]
[im 1/15]
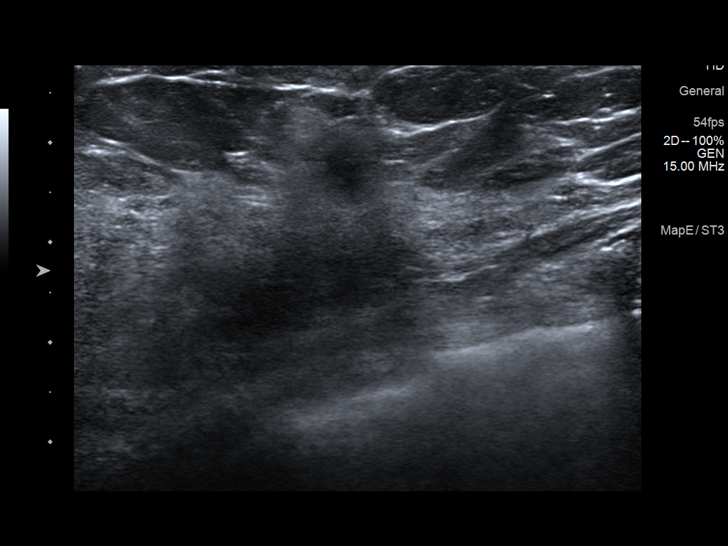
[im 2/15]
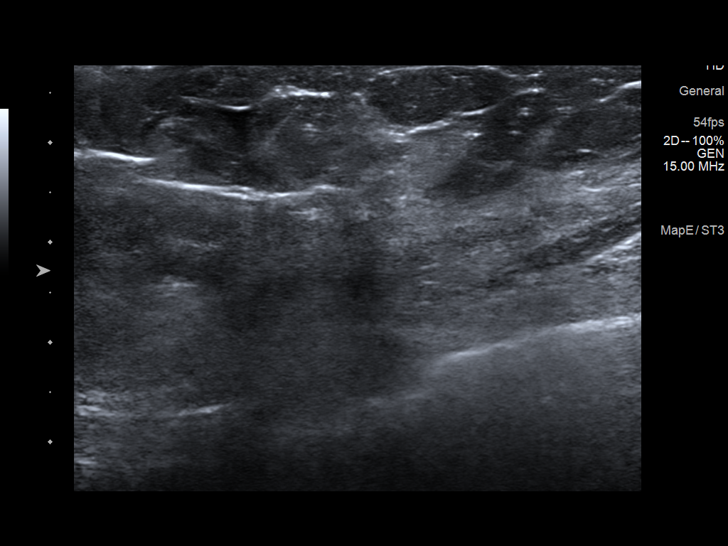
[im 4/15]
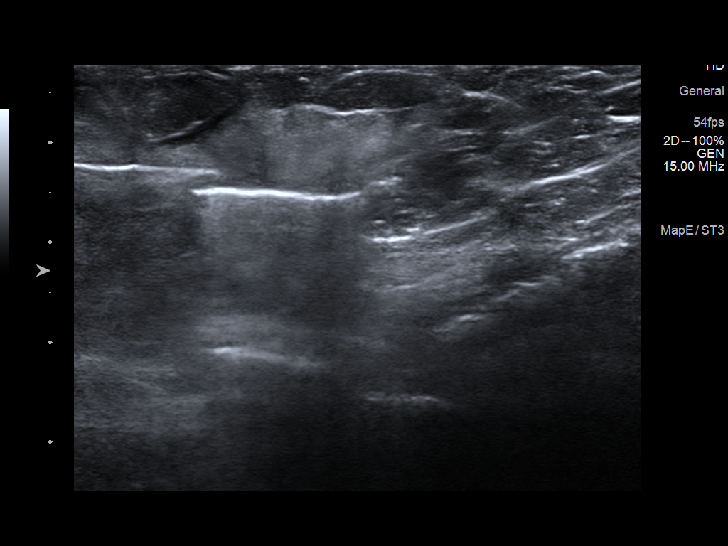
[im 5/15]
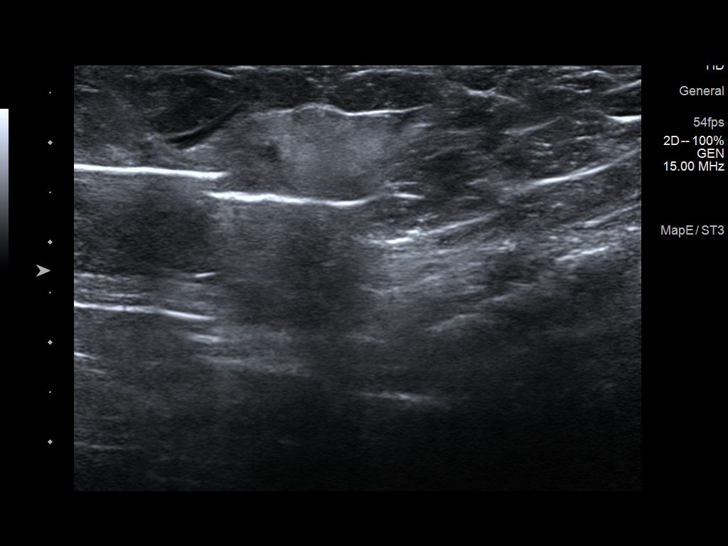
[im 6/15]
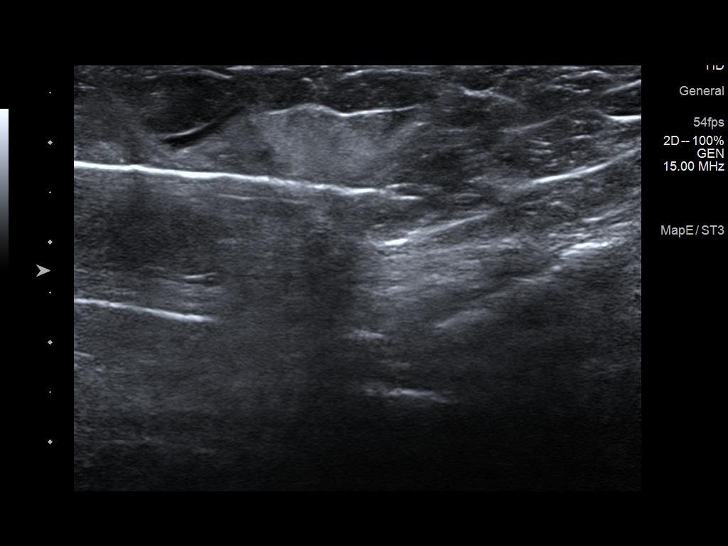
[im 7/15]
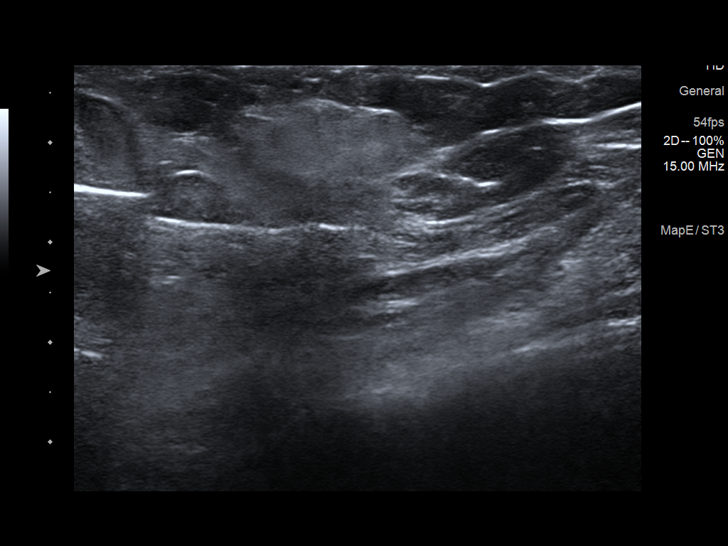
[im 9/15]
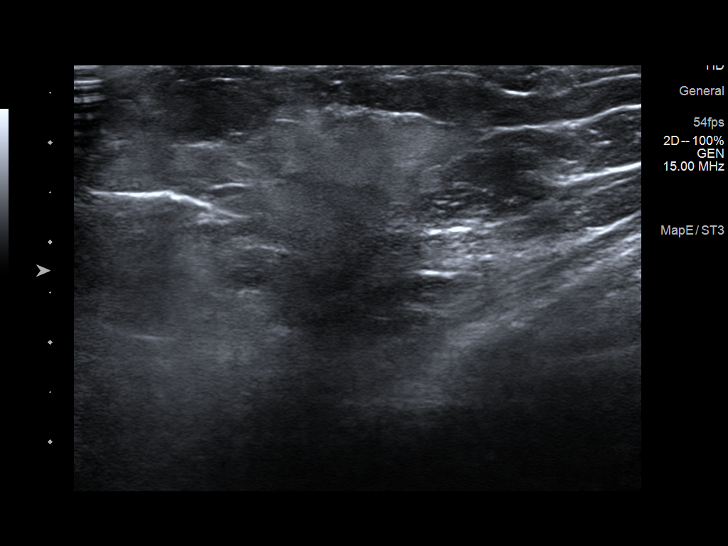
[im 10/15]
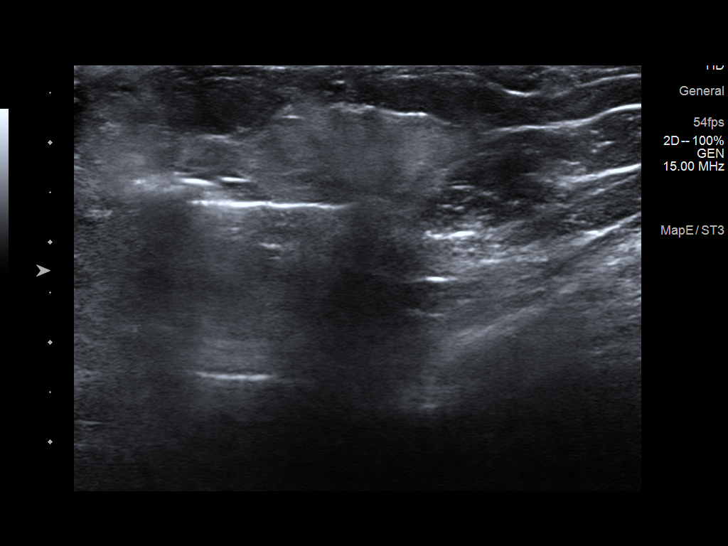
[im 11/15]
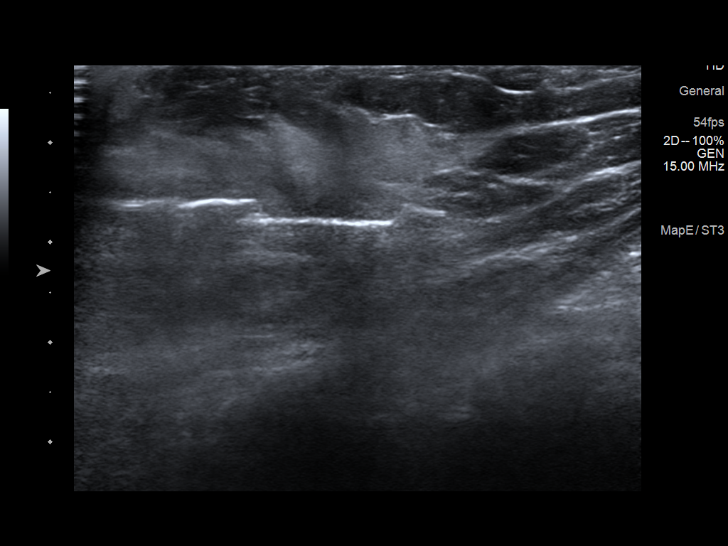
[im 12/15]
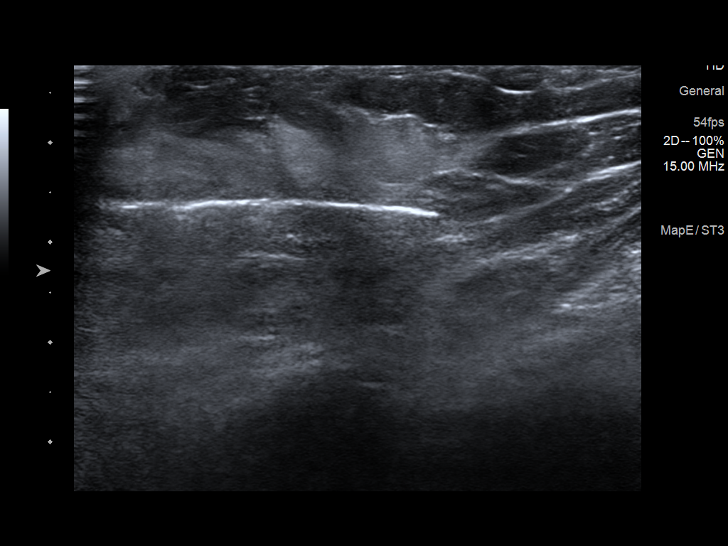
[im 14/15]
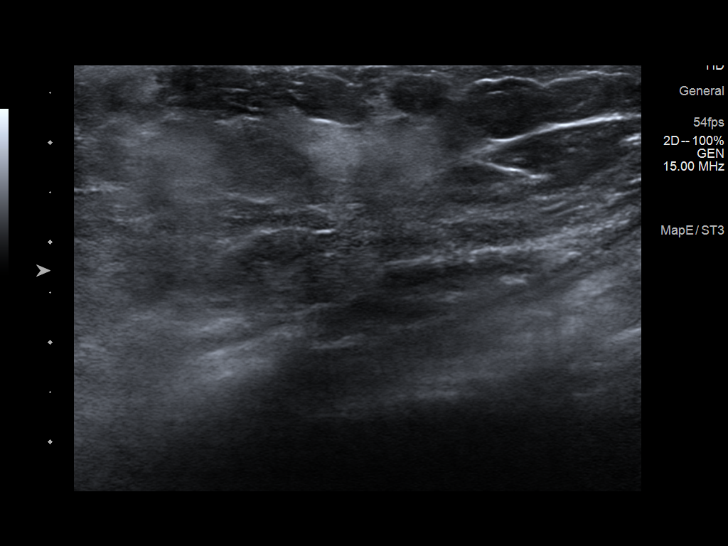
[im 15/15]
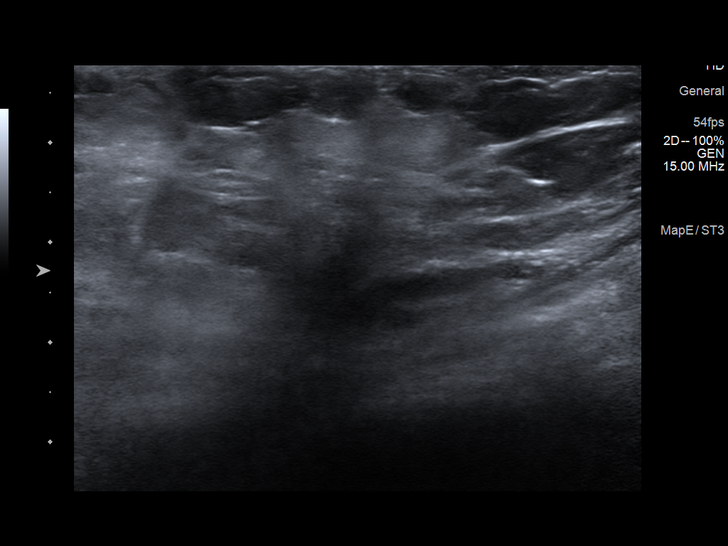

[12 of 15 positions shown; findings below may reference images not displayed]



Lesion quadrant: Lower inner quadrant

Using sterile technique and 1% Lidocaine as local anesthetic, under
direct ultrasound visualization, a 12 gauge NAOMI device was
used to perform biopsy of a hypoechoic mass with associated
shadowing in the 4 o'clock axis of the right breast using a lateral
approach. At the conclusion of the procedure ribbon tissue marker
clip was deployed into the biopsy cavity. Follow up 2 view mammogram
was performed and dictated separately.
IMPRESSION: Ultrasound guided biopsy of the right breast. No apparent
complications.

ADDENDUM:
Pathology revealed GRADE II INVASIVE MAMMARY CARCINOMA WITH LOBULAR
FEATURES of the Right breast, 4 o'clock, [E0]. This was found to be
concordant by Dr. NAOMI.

Pathology results were discussed with the patient by telephone. The
patient reported doing well after the biopsy with tenderness at the
site. Post biopsy instructions and care were reviewed and questions
were answered. The patient was encouraged to call The [REDACTED]

Surgical consultation has been arranged with Dr. NAOMI at
[REDACTED] on [DATE].

Recommend bilateral breast MRI for evaluation of extent of disease
given the sonographic findings suggestive of a larger extent of
disease and the lobular histology.

Pathology results reported by NAOMI, RN on [DATE].



Lesion quadrant: Lower inner quadrant

Using sterile technique and 1% Lidocaine as local anesthetic, under
direct ultrasound visualization, a 12 gauge NAOMI device was
used to perform biopsy of a hypoechoic mass with associated
shadowing in the 4 o'clock axis of the right breast using a lateral
approach. At the conclusion of the procedure ribbon tissue marker
clip was deployed into the biopsy cavity. Follow up 2 view mammogram
was performed and dictated separately.
IMPRESSION: Ultrasound guided biopsy of the right breast. No apparent
complications.

## 2019-11-02 ENCOUNTER — Encounter: Payer: Self-pay | Admitting: Adult Health

## 2019-11-02 DIAGNOSIS — C50311 Malignant neoplasm of lower-inner quadrant of right female breast: Secondary | ICD-10-CM | POA: Insufficient documentation

## 2019-11-02 DIAGNOSIS — Z170 Estrogen receptor positive status [ER+]: Secondary | ICD-10-CM | POA: Insufficient documentation

## 2019-11-04 ENCOUNTER — Telehealth: Payer: Self-pay | Admitting: Hematology and Oncology

## 2019-11-04 NOTE — Telephone Encounter (Signed)
Received a new pt referral from Dr. Marlou Starks for dx of breast cancer. Sydney Moody has been cld and scheduled to see Dr. Lindi Adie on 1/21 at 345pm. Pt aware to arrive 15 minutes early.

## 2019-11-09 ENCOUNTER — Other Ambulatory Visit: Payer: Self-pay | Admitting: General Surgery

## 2019-11-09 DIAGNOSIS — C50311 Malignant neoplasm of lower-inner quadrant of right female breast: Secondary | ICD-10-CM

## 2019-11-09 DIAGNOSIS — Z170 Estrogen receptor positive status [ER+]: Secondary | ICD-10-CM

## 2019-11-09 NOTE — Progress Notes (Signed)
[facility] CONSULT NOTE  Patient Care Team: Kristen Loader, FNP as PCP - General (Family Medicine) Mauro Kaufmann, RN as Oncology Nurse Navigator Rockwell Germany, RN as Oncology Nurse Navigator  CHIEF COMPLAINTS/PURPOSE OF CONSULTATION:  Newly diagnosed breast cancer  HISTORY OF PRESENTING ILLNESS:  Sydney Moody 64 y.o. female is here because of recent diagnosis of right breast cancer. Screening mammogram on 10/05/19 detected a distortion in the right breast, plapable on exam. Diagnostic mammogram on 10/18/19 showed a 1.8cm mass in the right breast at the 4:00 position with abnormal tissue surrounding the mass measuring a total of 4.9cm, no right axillary adenopathy. Biopsy on 10/26/18 showed invasive mammary carcinoma with lobular features, grade 2, HER-2 negative (1+), ER+ 95%, PR+ 2%, KI67 5%. She presents to the clinic today for initial evaluation and discussion of treatment options.   I reviewed her records extensively and collaborated the history with the patient.  SUMMARY OF ONCOLOGIC HISTORY: Oncology History  Malignant neoplasm of lower-inner quadrant of right breast of female, estrogen receptor positive ([place])  10/27/2019 Cancer Staging   Staging form: Breast, AJCC 8th Edition - Clinical stage from 10/27/2019: Stage IA (cT1c, cN0, cM0, G2, ER+, PR+, HER2-) - Signed by Gardenia Phlegm, NP on 11/02/2019   11/02/2019 Initial Diagnosis   Screening mammogram detected a right breast distortion. Diagnostic mammogram showed a 1.8cm right breast mass, 4:00 position, with abnormal tissue surrounding the mass measuring a total of 4.9cm, no right axillary adenopathy. Biopsy showed invasive mammary carcinoma with lobular features, grade 2, HER-2 - (1+), ER+ 95%, PR+ 2%, KI67 5%.       MEDICAL HISTORY:  Past Medical History:  Diagnosis Date  . Elevated LDL cholesterol level 05/28/2018  . Osteoporosis 05/28/2018   Per [facility] records from 03/2016  . Panic disorder      SURGICAL HISTORY: Past Surgical History:  Procedure Laterality Date  . RHINOPLASTY      SOCIAL HISTORY: Social History   Socioeconomic History  . Marital status: Single    Spouse name: Not on file  . Number of children: Not on file  . Years of education: Not on file  . Highest education level: Not on file  Occupational History  . Not on file  Tobacco Use  . Smoking status: Former Smoker    Quit date: 05/03/1984    Years since quitting: 35.5  . Smokeless tobacco: Never Used  Substance and Sexual Activity  . Alcohol use: Yes    Alcohol/week: 1.0 - 2.0 standard drinks    Types: 1 - 2 Standard drinks or equivalent per week  . Drug use: Never  . Sexual activity: Not Currently  Other Topics Concern  . Not on file  Social History Narrative  . Not on file   Social Determinants of Health   Financial Resource Strain:   . Difficulty of Paying Living Expenses: Not on file  Food Insecurity:   . Worried About Charity fundraiser in the Last Year: Not on file  . Ran Out of Food in the Last Year: Not on file  Transportation Needs:   . Lack of Transportation (Medical): Not on file  . Lack of Transportation (Non-Medical): Not on file  Physical Activity:   . Days of Exercise per Week: Not on file  . Minutes of Exercise per Session: Not on file  Stress:   . Feeling of Stress : Not on file  Social Connections:   . Frequency of Communication with Friends  and Family: Not on file  . Frequency of Social Gatherings with Friends and Family: Not on file  . Attends Religious Services: Not on file  . Active Member of Clubs or Organizations: Not on file  . Attends Archivist Meetings: Not on file  . Marital Status: Not on file  Intimate Partner Violence:   . Fear of Current or Ex-Partner: Not on file  . Emotionally Abused: Not on file  . Physically Abused: Not on file  . Sexually Abused: Not on file    FAMILY HISTORY: Family History  Adopted: Yes    ALLERGIES:  is  allergic to iodine.  MEDICATIONS:  Current Outpatient Medications  Medication Sig Dispense Refill  . ALPRAZolam (XANAX) 0.5 MG tablet Take 0.5 mg by mouth 2 (two) times daily as needed for anxiety.    . B Complex Vitamins (B COMPLEX 1 PO) Take by mouth.    . cyclobenzaprine (FLEXERIL) 10 MG tablet Take 10 mg by mouth once.    . diphenhydrAMINE HCl (BENADRYL ALLERGY PO) Take by mouth.    . Ferrous Gluconate-C-Folic Acid (IRON-C PO) Take by mouth.    . fluticasone (FLONASE) 50 MCG/ACT nasal spray Place 1 spray into both nostrils daily.    . Homeopathic Products (ZICAM COLD REMEDY PO) Take by mouth.    Marland Kitchen ibuprofen (ADVIL,MOTRIN) 200 MG tablet Take 200 mg by mouth every 6 (six) hours as needed.    . Levothyroxine Sodium (LEVOTHROID PO) Take 50 mcg by mouth.    . metroNIDAZOLE (METROCREAM) 0.75 % cream Apply 1 application topically 2 (two) times daily.    . NON FORMULARY     . NON FORMULARY Apply 1 application topically.    . Omega-3 1000 MG CAPS Take by mouth.     No current facility-administered medications for this visit.    REVIEW OF SYSTEMS:   Constitutional: Denies fevers, chills or abnormal night sweats Eyes: Denies blurriness of vision, double vision or watery eyes Ears, nose, mouth, throat, and face: Denies mucositis or sore throat Respiratory: Denies cough, dyspnea or wheezes Cardiovascular: Denies palpitation, chest discomfort or lower extremity swelling Gastrointestinal:  Denies nausea, heartburn or change in bowel habits Skin: Denies abnormal skin rashes Lymphatics: Denies new lymphadenopathy or easy bruising Neurological:Denies numbness, tingling or new weaknesses Behavioral/Psych: Mood is stable, no new changes  Breast: Palpable right breast mass All other systems were reviewed with the patient and are negative.  PHYSICAL EXAMINATION: ECOG PERFORMANCE STATUS: 1 - Symptomatic but completely ambulatory  Vitals:   11/10/19 1545  BP: 117/71  Pulse: 75  Temp: 97.9 F  (36.6 C)  SpO2: 100%   Filed Weights   11/10/19 1545  Weight: 144 lb 3.2 oz (65.4 kg)    GENERAL:alert, no distress and comfortable SKIN: skin color, texture, turgor are normal, no rashes or significant lesions EYES: normal, conjunctiva are pink and non-injected, sclera clear OROPHARYNX:no exudate, no erythema and lips, buccal mucosa, and tongue normal  NECK: supple, thyroid normal size, non-tender, without nodularity LYMPH:  no palpable lymphadenopathy in the cervical, axillary or inguinal LUNGS: clear to auscultation and percussion with normal breathing effort HEART: regular rate & rhythm and no murmurs and no lower extremity edema ABDOMEN:abdomen soft, non-tender and normal bowel sounds Musculoskeletal:no cyanosis of digits and no clubbing  PSYCH: alert & oriented x 3 with fluent speech NEURO: no focal motor/sensory deficits    LABORATORY DATA:  I have reviewed the data as listed Lab Results  Component Value Date  WBC 6.1 05/14/2009   HGB 13.4 05/14/2009   HCT 38.7 05/14/2009   MCV 93.0 05/14/2009   PLT 217.0 05/14/2009   Lab Results  Component Value Date   NA 144 04/24/2009   K 4.6 04/24/2009   CL 106 04/24/2009   CO2 31 04/24/2009    RADIOGRAPHIC STUDIES: I have personally reviewed the radiological reports and agreed with the findings in the report.  ASSESSMENT AND PLAN:  Malignant neoplasm of lower-inner quadrant of right breast of female, estrogen receptor positive ([place]) 11/02/2019: Screening mammogram detected right breast distortion 1.8 cm 4 o'clock position with surrounding abnormality together makes it 4.9 cm, no lymph nodes, biopsy revealed invasive lobular cancer grade 2 ER 95%, PR 2%, HER-2 negative, Ki-67 5%  Pathology and radiology counseling:Discussed with the patient, the details of pathology including the type of breast cancer,the clinical staging, the significance of ER, PR and HER-2/neu receptors and the implications for treatment. After reviewing  the pathology in detail, we proceeded to discuss the different treatment options between surgery, radiation, chemotherapy, antiestrogen therapies.  Recommendations: 1. Breast conserving surgery with sentinel lymph node biopsy followed by 2. Oncotype DX testing to determine if chemotherapy would be of any benefit followed by 3. Adjuvant radiation therapy followed by 4. Adjuvant antiestrogen therapy ----------------------------------------------------------------------------------------------------------------------------------- Breast MRI is on 2/4. If the tumor is quite large, she will receive Neo adj anti estrogen therapy with Anastrozole (plan for 6 months with a 3 month interim Mammogram and Ultrasound)  Anastrozole Counseling: We discussed the risks and benefits of anti-estrogen therapy with aromatase inhibitors. These include but not limited to insomnia, hot flashes, mood changes, vaginal dryness, bone density loss, and weight gain. We strongly believe that the benefits far outweigh the risks. Patient understands these risks and consented to starting treatment.    We will start her on it today and wait for final disposition from MRI  Oncotype counseling: I discussed Oncotype DX test. I explained to the patient that this is a 21 gene panel to evaluate patient tumors DNA to calculate recurrence score. This would help determine whether patient has high risk or intermediate risk or low risk breast cancer. She understands that if her tumor was found to be high risk, she would benefit from systemic chemotherapy. If low risk, no need of chemotherapy. If she was found to be intermediate risk, we would need to evaluate the score as well as other risk factors and determine if an abbreviated chemotherapy may be of benefit.  Return to clinic in 3 months if we are going to stay on neoadj HT  If surgery is happening first, then we will see her 1 week after surgery  Severe Anxiety issues: on Anti anxiety  meds   All questions were answered. The patient knows to call the clinic with any problems, questions or concerns.   Rulon Eisenmenger, MD, MPH 11/10/2019    I, Molly Dorshimer, am acting as scribe for Nicholas Lose, MD.  I have reviewed the above documentation for accuracy and completeness, and I agree with the above.

## 2019-11-09 NOTE — Progress Notes (Signed)
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   Receptor Status: ER(95%), PR (2%), Her2-neu (negative, 1+), Ki-(5%)  Did patient present with symptoms (if so, please note symptoms) or was this found on screening mammography?: She had routine screening mammography on 10/05/2019 showing a possible abnormality in the right breast. She underwent right diagnostic mammography and right breast ultrasonography on 10/18/2019 showing: suspicious 1.8 cm right breast mass at 4 o'clock; abnormal 4.9 cm shadowing tissue surrounding the dominant mass.  Biopsy on 10/27/2019 showed: invasive lobular carcinoma, grade 2. Prognostic indicators significant for: estrogen receptor, 95% positive and progesterone receptor, 2% positive. Proliferation marker Ki67 at 5%. HER2 negative.  Past/Anticipated interventions by surgeon, if any: Awaiting definitive surgery.  Past/Anticipated interventions by medical oncology, if any: Chemotherapy Scheduled to see Dr. Lindi Adie this afternoon  Lymphedema issues, if any: Pt is pre-surgical  Pain issues, if any: Pt denies c/o pain but does state she has "the usual aches and pains you have when you get older"  SAFETY ISSUES:  Prior radiation? no  Pacemaker/ICD? no  Possible current pregnancy?no  Is the patient on methotrexate? no  Current Complaints / other details:  Pt presents today for initial consult with Dr. Sondra Come for Radiation Oncology. Pt is anxious, pacing.   BP 121/81 (BP Location: Right Arm, Patient Position: Sitting, Cuff Size: Normal)   Pulse 73   Temp 97.9 F (36.6 C)   Resp 20   Wt 144 lb 3.2 oz (65.4 kg)   SpO2 100%   BMI 22.25 kg/m   Wt Readings from Last 3 Encounters:  11/10/19 144 lb 3.2 oz (65.4 kg)  05/03/18 165 lb 3.2 oz (74.9 kg)  10/17/10 139 lb 3.2 oz (63.1 kg)       Loma Sousa, RN 11/10/2019,1:11 PM

## 2019-11-10 ENCOUNTER — Ambulatory Visit
Admission: RE | Admit: 2019-11-10 | Discharge: 2019-11-10 | Disposition: A | Discharge status: Home / Self Care | Payer: Managed Care, Other (non HMO) | Source: Ambulatory Visit | Attending: Radiation Oncology | Admitting: Radiation Oncology

## 2019-11-10 ENCOUNTER — Encounter: Payer: Self-pay | Admitting: Radiation Oncology

## 2019-11-10 ENCOUNTER — Inpatient Hospital Stay: Payer: Managed Care, Other (non HMO) | Attending: Hematology and Oncology | Admitting: Hematology and Oncology

## 2019-11-10 ENCOUNTER — Other Ambulatory Visit: Payer: Self-pay

## 2019-11-10 VITALS — BP 121/81 | HR 73 | Temp 97.9°F | Resp 20 | Wt 144.2 lb

## 2019-11-10 DIAGNOSIS — C50311 Malignant neoplasm of lower-inner quadrant of right female breast: Secondary | ICD-10-CM

## 2019-11-10 DIAGNOSIS — Z87891 Personal history of nicotine dependence: Secondary | ICD-10-CM | POA: Insufficient documentation

## 2019-11-10 DIAGNOSIS — F419 Anxiety disorder, unspecified: Secondary | ICD-10-CM | POA: Diagnosis not present

## 2019-11-10 DIAGNOSIS — Z170 Estrogen receptor positive status [ER+]: Secondary | ICD-10-CM

## 2019-11-10 DIAGNOSIS — Z79899 Other long term (current) drug therapy: Secondary | ICD-10-CM

## 2019-11-10 DIAGNOSIS — Z7901 Long term (current) use of anticoagulants: Secondary | ICD-10-CM | POA: Diagnosis not present

## 2019-11-10 DIAGNOSIS — E785 Hyperlipidemia, unspecified: Secondary | ICD-10-CM | POA: Insufficient documentation

## 2019-11-10 DIAGNOSIS — M810 Age-related osteoporosis without current pathological fracture: Secondary | ICD-10-CM | POA: Diagnosis not present

## 2019-11-10 DIAGNOSIS — F410 Panic disorder [episodic paroxysmal anxiety] without agoraphobia: Secondary | ICD-10-CM | POA: Diagnosis not present

## 2019-11-10 DIAGNOSIS — Z791 Long term (current) use of non-steroidal anti-inflammatories (NSAID): Secondary | ICD-10-CM | POA: Diagnosis not present

## 2019-11-10 MED ORDER — ANASTROZOLE 1 MG PO TABS: 1.0000 mg | ORAL_TABLET | Freq: Every day | ORAL | 3 refills | Status: DC

## 2019-11-10 MED ORDER — ANASTROZOLE 1 MG PO TABS: 1.0000 mg | ORAL_TABLET | Freq: Every day | ORAL | 0 refills | Status: DC

## 2019-11-10 NOTE — Progress Notes (Signed)
Radiation Oncology         [phone] ________________________________  Initial Outpatient Consultation  Name: Sydney Moody MRN: [id number]  Date: 11/10/2019  DOB: 1956/07/13  JG:OTLXB, Beryle Lathe, FNP  Jovita Kussmaul, MD   REFERRING PHYSICIAN: Autumn Messing III, MD  DIAGNOSIS: The encounter diagnosis was Malignant neoplasm of lower-inner quadrant of right breast of female, estrogen receptor positive ([place]).  Stage IA Right Breast LIQ, Invasive Lobular Carcinoma, ER+ / PR+ / Her2-, Grade 2  HISTORY OF PRESENT ILLNESS::Sydney Moody is a 64 y.o. female who is accompanied by no one due to COVID-19 restrictions. She had routine screening mammography on 10/05/2019 showing a possible abnormality in the right breast. She underwent right diagnostic mammography and right breast ultrasonography on 10/18/2019 showing: suspicious 1.8 cm right breast mass at 4 o'clock; abnormal 4.9 cm shadowing tissue surrounding the dominant mass.  Biopsy on 10/27/2019 showed: invasive lobular carcinoma, grade 2. Prognostic indicators significant for: estrogen receptor, 95% positive and progesterone receptor, 2% positive. Proliferation marker Ki67 at 5%. HER2 negative.  She is scheduled to meet Dr. Lindi Adie later today.   PREVIOUS RADIATION THERAPY: No  PAST MEDICAL HISTORY:  Past Medical History:  Diagnosis Date  . Elevated LDL cholesterol level 05/28/2018  . Osteoporosis 05/28/2018   Per [place] records from 03/2016  . Panic disorder     PAST SURGICAL HISTORY: Past Surgical History:  Procedure Laterality Date  . RHINOPLASTY      FAMILY HISTORY:  Family History  Adopted: Yes    SOCIAL HISTORY:  Social History   Tobacco Use  . Smoking status: Former Smoker    Quit date: 05/03/1984    Years since quitting: 35.5  . Smokeless tobacco: Never Used  Substance Use Topics  . Alcohol use: Yes    Alcohol/week: 1.0 - 2.0 standard drinks    Types: 1 - 2 Standard drinks or equivalent per week  . Drug use:  Never    ALLERGIES:  Allergies  Allergen Reactions  . Iodine     REACTION: POSSIBLE    MEDICATIONS:  Current Outpatient Medications  Medication Sig Dispense Refill  . ALPRAZolam (XANAX) 0.5 MG tablet Take 0.5 mg by mouth 2 (two) times daily as needed for anxiety.    . cyclobenzaprine (FLEXERIL) 10 MG tablet Take 10 mg by mouth once.    . diphenhydrAMINE HCl (BENADRYL ALLERGY PO) Take by mouth.    . Ferrous Gluconate-C-Folic Acid (IRON-C PO) Take by mouth.    . fluticasone (FLONASE) 50 MCG/ACT nasal spray Place 1 spray into both nostrils daily.    . Homeopathic Products (ZICAM COLD REMEDY PO) Take by mouth.    Marland Kitchen ibuprofen (ADVIL,MOTRIN) 200 MG tablet Take 200 mg by mouth every 6 (six) hours as needed.    . Levothyroxine Sodium (LEVOTHROID PO) Take 50 mcg by mouth.    . metroNIDAZOLE (METROCREAM) 0.75 % cream Apply 1 application topically 2 (two) times daily.    . NON FORMULARY     . NON FORMULARY Apply 1 application topically.    . Omega-3 1000 MG CAPS Take by mouth.    Marland Kitchen anastrozole (ARIMIDEX) 1 MG tablet Take 1 tablet (1 mg total) by mouth daily. 90 tablet 3  . B Complex Vitamins (B COMPLEX 1 PO) Take by mouth.     No current facility-administered medications for this encounter.    REVIEW OF SYSTEMS:  A 10+ POINT REVIEW OF SYSTEMS WAS OBTAINED including neurology, dermatology, psychiatry, cardiac, respiratory, lymph, extremities, GI, GU, musculoskeletal,  constitutional, reproductive, HEENT.  She denied any pain nipple discharge or bleeding in the breast prior to diagnosis    PHYSICAL EXAM:  weight is 144 lb 3.2 oz (65.4 kg). Her temperature is 97.9 F (36.6 C). Her blood pressure is 121/81 and her pulse is 73. Her respiration is 20 and oxygen saturation is 100%.   General: Alert and oriented, in no acute distress HEENT: Head is normocephalic. Extraocular movements are intact.  Neck: Neck is supple, no palpable cervical or supraclavicular lymphadenopathy. Heart: Regular in rate  and rhythm with no murmurs, rubs, or gallops. Chest: Clear to auscultation bilaterally, with no rhonchi, wheezes, or rales. Abdomen: Soft, nontender, nondistended, with no rigidity or guarding. Extremities: No cyanosis or edema. Lymphatics: see Neck Exam Skin: No concerning lesions. Musculoskeletal: symmetric strength and muscle tone throughout. Neurologic: Cranial nerves II through XII are grossly intact. No obvious focalities. Speech is fluent. Coordination is intact. Psychiatric: Judgment and insight are intact. Affect is appropriate. Left Breast: no palpable mass, nipple discharge or bleeding. Right Breast: Patient has some bruising in the lower inner quadrant and induration consistent with tumor mass or biopsy/ bruising effects  ECOG = 1    LABORATORY DATA:  Lab Results  Component Value Date   WBC 6.1 05/14/2009   HGB 13.4 05/14/2009   HCT 38.7 05/14/2009   MCV 93.0 05/14/2009   PLT 217.0 05/14/2009   NEUTROABS 4.2 05/14/2009   Lab Results  Component Value Date   NA 144 04/24/2009   K 4.6 04/24/2009   CL 106 04/24/2009   CO2 31 04/24/2009   GLUCOSE 85 04/24/2009   CREATININE 1.0 04/24/2009   CALCIUM 9.4 04/24/2009      RADIOGRAPHY: US BREAST LTD UNI RIGHT INC AXILLA  Result Date: 10/18/2019 CLINICAL DATA:  Patient recalled from screening for right breast distortion. EXAM: DIGITAL DIAGNOSTIC RIGHT MAMMOGRAM WITH CAD AND TOMO ULTRASOUND RIGHT BREAST COMPARISON:  Previous exam(s). ACR Breast Density Category c: The breast tissue is heterogeneously dense, which may obscure small masses. FINDINGS: Within the lower inner right breast posterior depth there is a persistent large area of architectural distortion. This was further evaluated with spot compression CC and MLO tomosynthesis images as well as full paddle true lateral views of the right breast. Mammographic images were processed with CAD. On physical exam, there is a firm mass palpated within the lower inner right  breast. Targeted ultrasound is performed, showing a 1.7 x 1.5 x 1.8 cm mixed echogenicity irregular mass right breast 4 o'clock position 4 cm from nipple. Note there is abnormal appearing tissue surrounding the dominant mass with posterior acoustical shadowing within the right breast 4 o'clock position 4 cm from nipple in total measuring 4.9 x 4.3 cm. No right axillary adenopathy. IMPRESSION: Suspicious right breast mass concerning for primary breast malignancy. There is abnormal shadowing tissue surrounding the dominant mass concerning for a larger extent of disease, particularly if this represents a lobular carcinoma. In total this measures up to approximately 5 cm. RECOMMENDATION: Ultrasound-guided core needle biopsy right breast mass. Recommend bilateral breast MRI for evaluation of extent of disease given the sonographic findings suggestive of a larger extent of disease. I have discussed the findings and recommendations with the patient. If applicable, a reminder letter will be sent to the patient regarding the next appointment. BI-RADS CATEGORY  5: Highly suggestive of malignancy. Electronically Signed   By: Lovey Newcomer M.D.   On: 10/18/2019 14:48   MM DIAG BREAST TOMO UNI RIGHT  Result Date:  10/18/2019 CLINICAL DATA:  Patient recalled from screening for right breast distortion. EXAM: DIGITAL DIAGNOSTIC RIGHT MAMMOGRAM WITH CAD AND TOMO ULTRASOUND RIGHT BREAST COMPARISON:  Previous exam(s). ACR Breast Density Category c: The breast tissue is heterogeneously dense, which may obscure small masses. FINDINGS: Within the lower inner right breast posterior depth there is a persistent large area of architectural distortion. This was further evaluated with spot compression CC and MLO tomosynthesis images as well as full paddle true lateral views of the right breast. Mammographic images were processed with CAD. On physical exam, there is a firm mass palpated within the lower inner right breast. Targeted ultrasound  is performed, showing a 1.7 x 1.5 x 1.8 cm mixed echogenicity irregular mass right breast 4 o'clock position 4 cm from nipple. Note there is abnormal appearing tissue surrounding the dominant mass with posterior acoustical shadowing within the right breast 4 o'clock position 4 cm from nipple in total measuring 4.9 x 4.3 cm. No right axillary adenopathy. IMPRESSION: Suspicious right breast mass concerning for primary breast malignancy. There is abnormal shadowing tissue surrounding the dominant mass concerning for a larger extent of disease, particularly if this represents a lobular carcinoma. In total this measures up to approximately 5 cm. RECOMMENDATION: Ultrasound-guided core needle biopsy right breast mass. Recommend bilateral breast MRI for evaluation of extent of disease given the sonographic findings suggestive of a larger extent of disease. I have discussed the findings and recommendations with the patient. If applicable, a reminder letter will be sent to the patient regarding the next appointment. BI-RADS CATEGORY  5: Highly suggestive of malignancy. Electronically Signed   By: Lovey Newcomer M.D.   On: 10/18/2019 14:48   MM CLIP PLACEMENT RIGHT  Result Date: 10/27/2019 CLINICAL DATA:  Ultrasound-guided biopsy was performed of a mass in the 4 o'clock axis of the right breast. EXAM: DIAGNOSTIC RIGHT MAMMOGRAM POST ULTRASOUND BIOPSY COMPARISON:  Previous exam(s). FINDINGS: Mammographic images were obtained following ultrasound guided biopsy of the right breast. The biopsy marking clip is in expected position at the site of biopsy. IMPRESSION: Appropriate positioning of the ribbon shaped biopsy marking clip at the site of biopsy in the 4 o'clock position. Final Assessment: Post Procedure Mammograms for Marker Placement Electronically Signed   By: Curlene Dolphin M.D.   On: 10/27/2019 16:04   [place] RT BREAST BX W LOC DEV 1ST LESION IMG BX SPEC US GUIDE  Addendum Date: 10/28/2019   ADDENDUM REPORT: 10/28/2019  13:45 ADDENDUM: Pathology revealed GRADE II INVASIVE MAMMARY CARCINOMA WITH LOBULAR FEATURES of the Right breast, 4 o'clock, 4cmfn. This was found to be concordant by Dr. Curlene Dolphin. Pathology results were discussed with the patient by telephone. The patient reported doing well after the biopsy with tenderness at the site. Post biopsy instructions and care were reviewed and questions were answered. The patient was encouraged to call [facility] for any additional concerns. Surgical consultation has been arranged with Dr. Autumn Messing at [facility] on November 03, 2019. Recommend bilateral breast MRI for evaluation of extent of disease given the sonographic findings suggestive of a larger extent of disease and the lobular histology. Pathology results reported by Terie Purser, RN on 10/28/2019. Electronically Signed   By: Curlene Dolphin M.D.   On: 10/28/2019 13:45   Result Date: 10/28/2019 CLINICAL DATA:  Ultrasound-guided core needle biopsy was recommended of a suspicious palpable mass 4 o'clock position right breast. EXAM: ULTRASOUND GUIDED RIGHT BREAST CORE NEEDLE BIOPSY COMPARISON:  Previous exam(s).  FINDINGS: I met with the patient and we discussed the procedure of ultrasound-guided biopsy, including benefits and alternatives. We discussed the high likelihood of a successful procedure. We discussed the risks of the procedure, including infection, bleeding, tissue injury, clip migration, and inadequate sampling. Informed written consent was given. The usual time-out protocol was performed immediately prior to the procedure. Lesion quadrant: Lower inner quadrant Using sterile technique and 1% Lidocaine as local anesthetic, under direct ultrasound visualization, a 12 gauge spring-loaded device was used to perform biopsy of a hypoechoic mass with associated shadowing in the 4 o'clock axis of the right breast using a lateral approach. At the conclusion of the procedure ribbon  tissue marker clip was deployed into the biopsy cavity. Follow up 2 view mammogram was performed and dictated separately. IMPRESSION: Ultrasound guided biopsy of the right breast. No apparent complications. Electronically Signed: By: Curlene Dolphin M.D. On: 10/27/2019 16:04      IMPRESSION: Stage IA Right Breast LIQ, Invasive Lobular Carcinoma, ER+ / PR+ / Her2-, Grade 2  The patient will proceed with an MRI in early February given the histology of invasive lobular carcinoma. If the  Patient's tumor mass is larger than appreciated on ultrasound and mammography she likely proceed with neoadjuvant hormonal therapy with intent to shrink the tumor mass so that she may have breast conserving surgery.  She will  also meet with plastic surgery in the event that she may not be a candidate for breast conserving surgery.  She does wish to proceed with breast conserving surgery if technically possible.    PLAN: Final surgical approach and management details are  pending upcoming breast MRI.    ------------------------------------------------  Blair Promise, PhD, MD  This document serves as a record of services personally performed by Gery Pray, MD. It was created on his behalf by Wilburn Mylar, a trained medical scribe. The creation of this record is based on the scribe's personal observations and the provider's statements to them. This document has been checked and approved by the attending provider.

## 2019-11-10 NOTE — Assessment & Plan Note (Signed)
11/02/2019: Screening mammogram detected right breast distortion 1.8 cm 4 o'clock position with surrounding abnormality together makes it 4.9 cm, no lymph nodes, biopsy revealed invasive lobular cancer grade 2 ER 95%, PR 2%, HER-2 negative, Ki-67 5%  Pathology and radiology counseling:Discussed with the patient, the details of pathology including the type of breast cancer,the clinical staging, the significance of ER, PR and HER-2/neu receptors and the implications for treatment. After reviewing the pathology in detail, we proceeded to discuss the different treatment options between surgery, radiation, chemotherapy, antiestrogen therapies.  Recommendations: 1. Breast conserving surgery with sentinel lymph node biopsy followed by 2. Oncotype DX testing to determine if chemotherapy would be of any benefit followed by 3. Adjuvant radiation therapy followed by 4. Adjuvant antiestrogen therapy  Oncotype counseling: I discussed Oncotype DX test. I explained to the patient that this is a 21 gene panel to evaluate patient tumors DNA to calculate recurrence score. This would help determine whether patient has high risk or intermediate risk or low risk breast cancer. She understands that if her tumor was found to be high risk, she would benefit from systemic chemotherapy. If low risk, no need of chemotherapy. If she was found to be intermediate risk, we would need to evaluate the score as well as other risk factors and determine if an abbreviated chemotherapy may be of benefit.  Return to clinic after surgery to discuss final pathology report and then determine if Oncotype DX testing will need to be sent.

## 2019-11-10 NOTE — Patient Instructions (Signed)
Coronavirus (COVID-19) Are you at risk?  Are you at risk for the Coronavirus (COVID-19)?  To be considered HIGH RISK for Coronavirus (COVID-19), you have to meet the following criteria:  . Traveled to [place], [place], [place], [place] or [place]; or in the [place] to [place], [place], [place], or [place]; and have fever, cough, and shortness of breath within the last 2 weeks of travel OR . Been in close contact with a person diagnosed with COVID-19 within the last 2 weeks and have fever, cough, and shortness of breath . IF YOU DO NOT MEET THESE CRITERIA, YOU ARE CONSIDERED LOW RISK FOR COVID-19.  What to do if you are HIGH RISK for COVID-19?  . If you are having a medical emergency, call 911. . Seek medical care right away. Before you go to a doctor's office, urgent care or emergency department, call ahead and tell them about your recent travel, contact with someone diagnosed with COVID-19, and your symptoms. You should receive instructions from your physician's office regarding next steps of care.  . When you arrive at healthcare provider, tell the healthcare staff immediately you have returned from visiting [place], [place], [place], [place] or [place]; or traveled in the [place] to [place], [place], [place], or [place]; in the last two weeks or you have been in close contact with a person diagnosed with COVID-19 in the last 2 weeks.   . Tell the health care staff about your symptoms: fever, cough and shortness of breath. . After you have been seen by a medical provider, you will be either: o Tested for (COVID-19) and discharged home on quarantine except to seek medical care if symptoms worsen, and asked to  - Stay home and avoid contact with others until you get your results (4-5 days)  - Avoid travel on public transportation if possible (such as bus, train, or airplane) or o Sent to the Emergency Department by EMS for evaluation, COVID-19 testing, and possible  admission depending on your condition and test results.  What to do if you are LOW RISK for COVID-19?  Reduce your risk of any infection by using the same precautions used for avoiding the common cold or flu:  . Wash your hands often with soap and warm water for at least 20 seconds.  If soap and water are not readily available, use an alcohol-based hand sanitizer with at least 60% alcohol.  . If coughing or sneezing, cover your mouth and nose by coughing or sneezing into the elbow areas of your shirt or coat, into a tissue or into your sleeve (not your hands). . Avoid shaking hands with others and consider head nods or verbal greetings only. . Avoid touching your eyes, nose, or mouth with unwashed hands.  . Avoid close contact with people who are sick. . Avoid places or events with large numbers of people in one location, like concerts or sporting events. . Carefully consider travel plans you have or are making. . If you are planning any travel outside or inside the [place], visit the CDC's Travelers' Health webpage for the latest health notices. . If you have some symptoms but not all symptoms, continue to monitor at home and seek medical attention if your symptoms worsen. . If you are having a medical emergency, call 911.   ADDITIONAL HEALTHCARE OPTIONS FOR PATIENTS  [facility] / e-Visit: [email or web address]         [facility]: [phone]  [facility]: [phone]                   [facility]: [phone]   

## 2019-11-11 ENCOUNTER — Encounter: Payer: Self-pay | Admitting: *Deleted

## 2019-11-17 ENCOUNTER — Telehealth: Payer: Self-pay | Admitting: Plastic Surgery

## 2019-11-17 NOTE — Telephone Encounter (Signed)

## 2019-11-18 ENCOUNTER — Ambulatory Visit (INDEPENDENT_AMBULATORY_CARE_PROVIDER_SITE_OTHER): Payer: Managed Care, Other (non HMO) | Admitting: Plastic Surgery

## 2019-11-18 ENCOUNTER — Other Ambulatory Visit: Payer: Self-pay

## 2019-11-18 ENCOUNTER — Encounter: Payer: Self-pay | Admitting: Plastic Surgery

## 2019-11-18 VITALS — BP 122/83 | HR 72 | Temp 97.3°F | Ht 66.0 in | Wt 145.0 lb

## 2019-11-18 DIAGNOSIS — Z170 Estrogen receptor positive status [ER+]: Secondary | ICD-10-CM

## 2019-11-18 DIAGNOSIS — C50311 Malignant neoplasm of lower-inner quadrant of right female breast: Secondary | ICD-10-CM | POA: Diagnosis not present

## 2019-11-18 NOTE — Progress Notes (Signed)
Patient ID: Sydney Moody, female    DOB: 1956-08-21, 64 y.o.   MRN: [id number]   Chief Complaint  Patient presents with  . Advice Only    for (R) breast reconstruction for malignant neoplasm of lower-inner quadrant     The patient is a 64 year old female here for consultation for breast reconstruction.  She noticed a right breast mass and underwent a work-up which revealed lobular cancer of the right breast.  It is estrogen and progesterone positive and HER-2 negative, Ki-67 equals 5%.  She is 5 feet 6 inches tall and weighs 145 pounds.  She is planning on having a right lumpectomy if the tumor shrinks with chemotherapy she will have a mastectomy if it does not shrink.  Her preoperative bra is a 30 4B she has a little bit of bruising from the biopsy.  The right breast is a little bit smaller than the left which has been this way for as long as she can remember and does not bother her much.   Review of Systems  Constitutional: Negative.   HENT: Negative.   Eyes: Negative.   Respiratory: Negative.  Negative for chest tightness.   Cardiovascular: Negative.  Negative for chest pain.  Gastrointestinal: Negative.  Negative for abdominal distention.  Genitourinary: Negative.   Musculoskeletal: Negative.  Negative for back pain.  Skin: Positive for color change. Negative for wound.  Hematological: Negative.   Psychiatric/Behavioral: Negative.     Past Medical History:  Diagnosis Date  . Elevated LDL cholesterol level 05/28/2018  . Osteoporosis 05/28/2018   Per [facility] records from 03/2016  . Panic disorder     Past Surgical History:  Procedure Laterality Date  . RHINOPLASTY        Current Outpatient Medications:  .  ALPRAZolam (XANAX) 0.5 MG tablet, Take 0.5 mg by mouth 2 (two) times daily as needed for anxiety., Disp: , Rfl:  .  anastrozole (ARIMIDEX) 1 MG tablet, Take 1 tablet (1 mg total) by mouth daily., Disp: 90 tablet, Rfl: 3 .  B Complex Vitamins (B COMPLEX 1 PO), Take by  mouth., Disp: , Rfl:  .  cyclobenzaprine (FLEXERIL) 10 MG tablet, Take 10 mg by mouth once., Disp: , Rfl:  .  diphenhydrAMINE HCl (BENADRYL ALLERGY PO), Take by mouth., Disp: , Rfl:  .  Ferrous Gluconate-C-Folic Acid (IRON-C PO), Take by mouth., Disp: , Rfl:  .  fluticasone (FLONASE) 50 MCG/ACT nasal spray, Place 1 spray into both nostrils daily., Disp: , Rfl:  .  Homeopathic Products (ZICAM COLD REMEDY PO), Take by mouth., Disp: , Rfl:  .  ibuprofen (ADVIL,MOTRIN) 200 MG tablet, Take 200 mg by mouth every 6 (six) hours as needed., Disp: , Rfl:  .  Levothyroxine Sodium (LEVOTHROID PO), Take 50 mcg by mouth., Disp: , Rfl:  .  metroNIDAZOLE (METROCREAM) 0.75 % cream, Apply 1 application topically 2 (two) times daily., Disp: , Rfl:  .  NON FORMULARY, , Disp: , Rfl:  .  NON FORMULARY, Apply 1 application topically., Disp: , Rfl:  .  Omega-3 1000 MG CAPS, Take by mouth., Disp: , Rfl:    Objective:   Vitals:   11/18/19 1335  BP: 122/83  Pulse: 72  Temp: (!) 97.3 F (36.3 C)  SpO2: 100%    Physical Exam Vitals and nursing note reviewed.  Constitutional:      Appearance: Normal appearance.  HENT:     Head: Normocephalic and atraumatic.  Cardiovascular:     Rate and  Rhythm: Normal rate.  Pulmonary:     Effort: Pulmonary effort is normal. No respiratory distress.  Abdominal:     General: Abdomen is flat. There is no distension.  Skin:    General: Skin is warm.  Neurological:     General: No focal deficit present.     Mental Status: She is alert and oriented to person, place, and time.  Psychiatric:        Mood and Affect: Mood normal.        Behavior: Behavior normal.     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 Several reconstruction options were explained to the patient.  It is important to remember that  breast reconstruction is an optional procedure. Reconstruction often requires several stages of surgery and this means more than one operation.  The surgeries are often done several months apart.  The entire process from start to finish can take a year or more. The major goal of breast reconstruction is to look normal in clothing. There will always be scars and a difference noticeable without clothes.  This is true for asymmetries where both breasts will not be identical.  Surgery may be needed or desired to the non-cancerous breast in order to achieve better symmetry and satisfactory results.  Regardless of the reconstructive method, there is always risks and the possibility that the procedure will fail or have complications.  This couls required additional surgeries.    We discussed the available methods of breast reconstruction and included:  1. Tissue expander with Acellular dermal matrix followed by implant based reconstruction. This can be done as one surgery or multiple surgeries.  2. Autologous reconstruction can include using a muscle or tissue from another area of the body for the reconstruction.  3. Combined procedures like the latissismus dorsi flaps that often uses the muscle with an expander or implant.  For each of the method discussed the risks, benefits, scars and recovery time were discussed in detail. Specific risks included bleeding, infection, hematoma, seroma, scarring, pain, wound healing complications, flap loss, fat necrosis, capsular contracture, need for implant removal, donor site complications, bulge, hernia, umbilical necrosis, need for urgent reoperation, and need for dressing changes.   After the options were discussed we focused on the patient's desires and the procedure that was best for her based on all the information.  A total of 50 minutes of face-to-face time was spent in this encounter, of which >50% was spent in counseling.     We reviewed the above information in  detail.  The patient is most interested in reconstruction of the right breast with expander and implant placement.  This will depend on whether or not she has a lumpectomy or a mastectomy.  She will let us know when she knows more information. Pictures were obtained of the patient and placed in the chart with the patient's or guardian's permission.   Wallace Going, DO   The 21st Century Cures Act was signed into law in 2016 which includes the topic of electronic health records.  This provides immediate access to information in MyChart.  This includes consultation notes, operative notes, office notes, lab results and pathology reports.  If you have any questions about what you read please let us know at your next visit or call us at the office.  We are right here with you.

## 2019-11-23 ENCOUNTER — Other Ambulatory Visit: Payer: Self-pay

## 2019-11-23 ENCOUNTER — Ambulatory Visit
Admission: RE | Admit: 2019-11-23 | Discharge: 2019-11-23 | Disposition: A | Discharge status: Home / Self Care | Payer: Managed Care, Other (non HMO) | Source: Ambulatory Visit | Attending: General Surgery | Admitting: General Surgery

## 2019-11-23 DIAGNOSIS — C50311 Malignant neoplasm of lower-inner quadrant of right female breast: Secondary | ICD-10-CM

## 2019-11-23 IMAGING — MR MR BREAST BILAT WO/W CM
8 of 12 series · 32 of 48 positions shown · IV contrast (6ml Multihance)
Comparison: Previous exam(s).

CLINICAL DATA: 63-year-old female with newly diagnosed invasive
mammary carcinoma with lobular features in the right breast.

LABS:  Creatinine was obtained on site at [HOSPITAL] at [REDACTED] [HOSPITAL].
Results: Creatinine 0.8 mg/dL.
EXAM:
BILATERAL BREAST MRI WITH AND WITHOUT CONTRAST
TECHNIQUE: Multiplanar, multisequence MR images of both breasts were obtained
prior to and following the intravenous administration of 6 ml of
Gadavist.

[Series 2: t2_tirm_tra ipat (a-p) · axial · 3.0mm · 0.70mm/px · 1 of 60 slices shown]
[im 1/60]
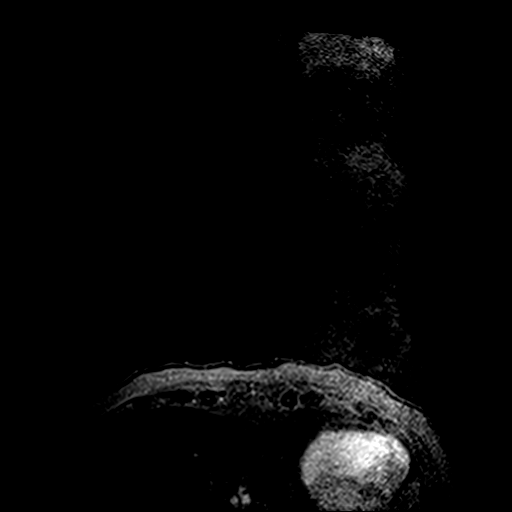

[Series 3: fl3d pre-cm no · axial · non-contrast · 1.2mm · 0.94mm/px · z∈[-79,+112]mm · 5 of 160 slices shown]
[im 1/160]
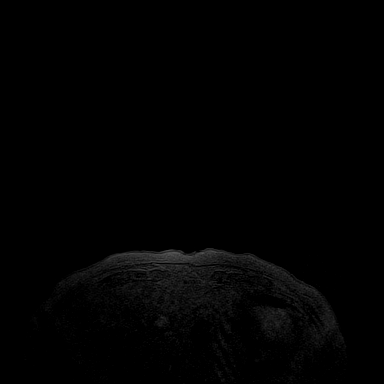
[im 40/160]
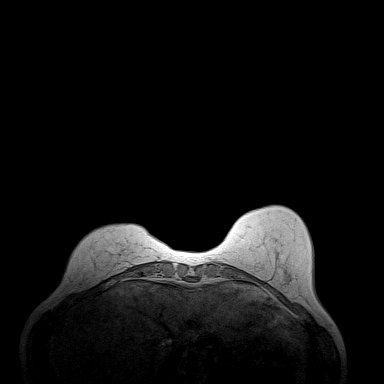
[im 80/160]
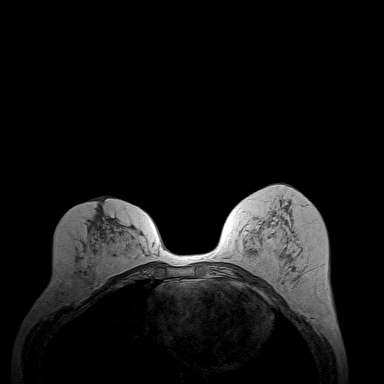
[im 120/160]
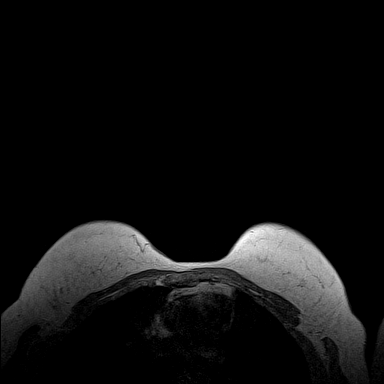
[im 160/160]
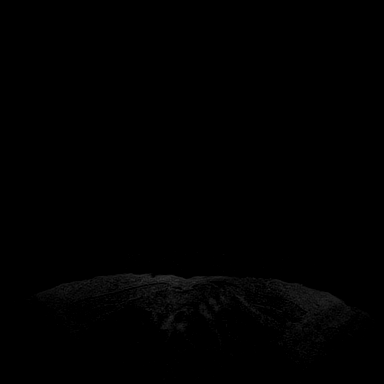

[Series 4: fl3d pre-cm · axial · non-contrast · 1.2mm · 0.94mm/px · z∈[-79,+112]mm · 5 of 160 slices shown]
[im 1/160]
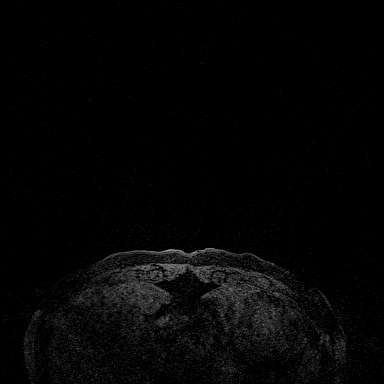
[im 40/160]
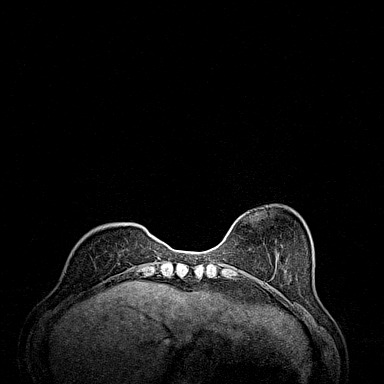
[im 80/160]
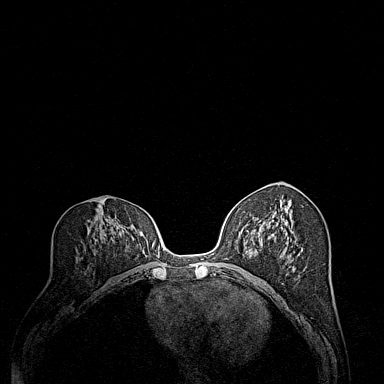
[im 120/160]
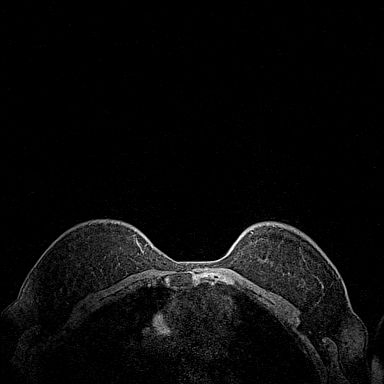
[im 160/160]
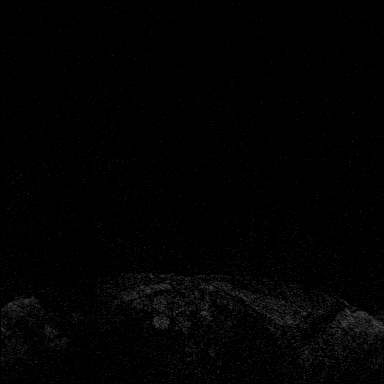

[Series 5: fl3d post-cm 20 · axial · 1.2mm · 0.94mm/px · z∈[-79,+112]mm · 5 of 160 slices shown (1 of 3)]
[im 1/160]
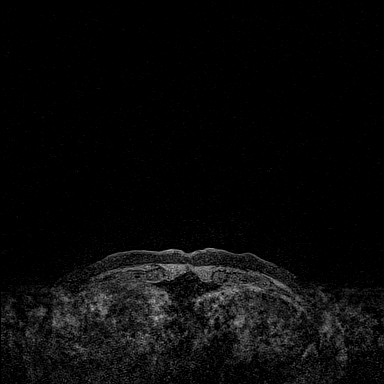
[im 40/160]
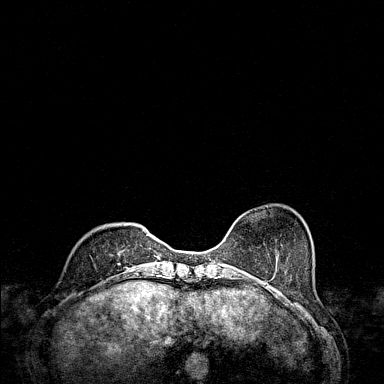
[im 80/160]
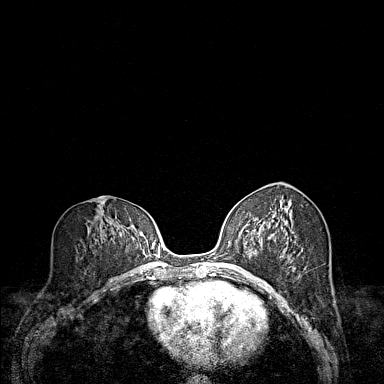
[im 120/160]
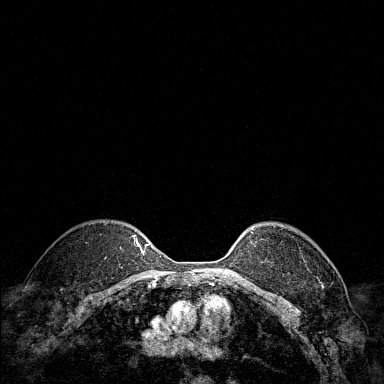
[im 160/160]
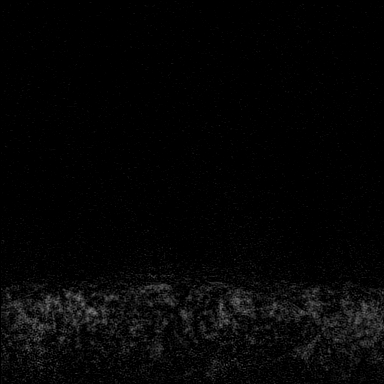

[Series 6: fl3d post-cm 20 · axial · 1.2mm · 0.94mm/px · z∈[-79,+112]mm · 5 of 160 slices shown (2 of 3)]
[im 1/160]
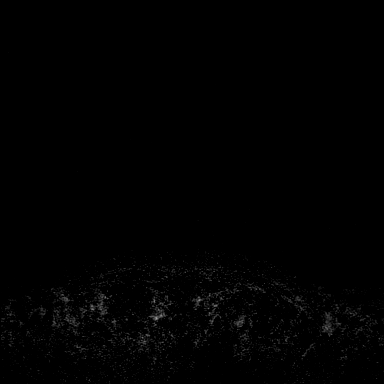
[im 40/160]
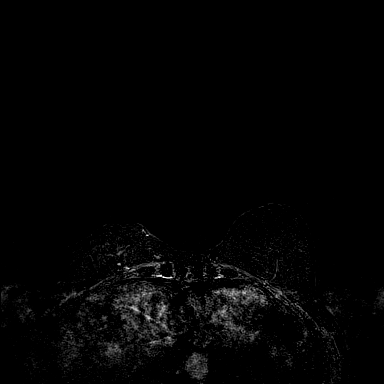
[im 80/160]
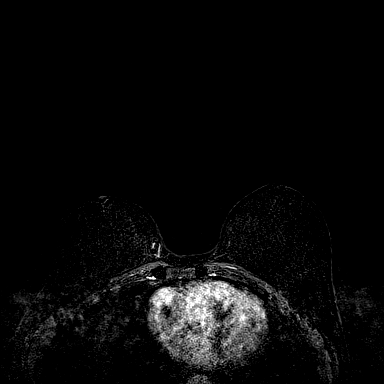
[im 120/160]
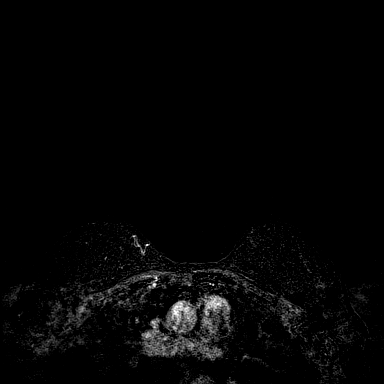
[im 160/160]
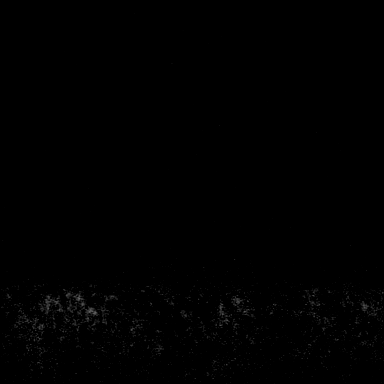

[Series 7: fl3d post-cm 20 · axial · 192.0mm · 0.94mm/px · 1 of 1 slices shown (3 of 3)]
[im 1/1]
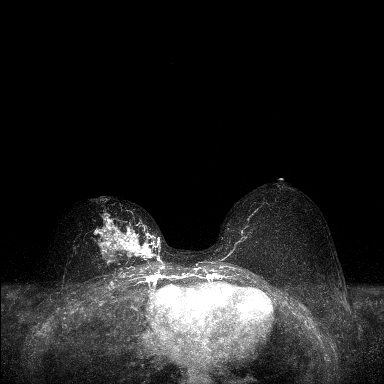

[Series 8: fl3d post-cm 3min · axial · 1.2mm · 0.94mm/px · z∈[-79,+112]mm · 6 of 160 slices shown]
[im 1/160]
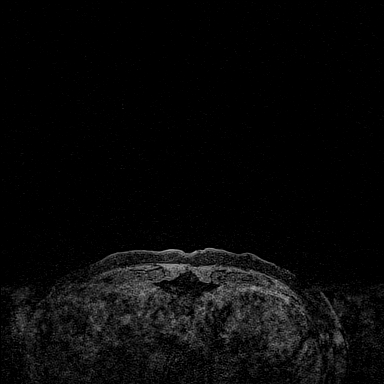
[im 32/160]
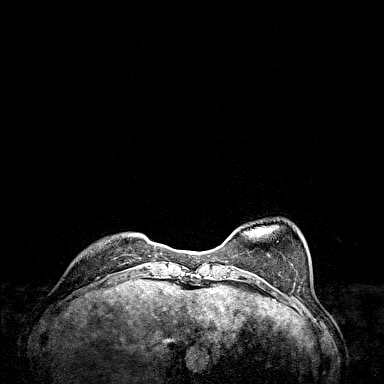
[im 64/160]
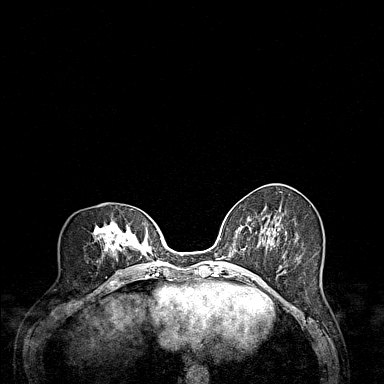
[im 96/160]
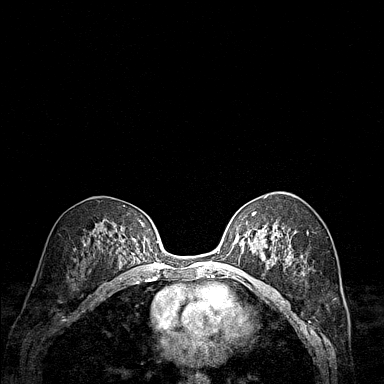
[im 128/160]
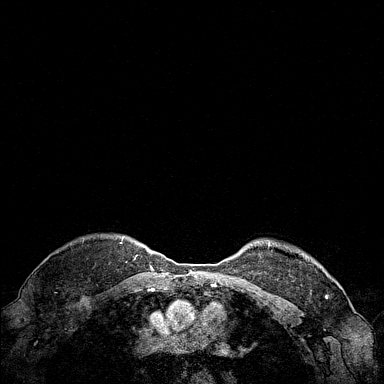
[im 160/160]
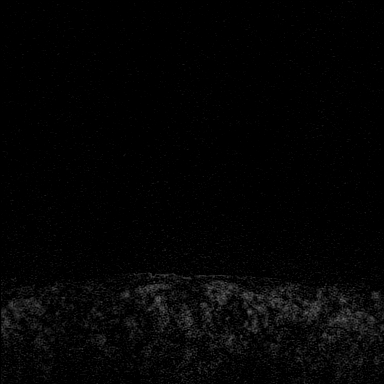

[Series 9: fl3d post-cm 3min_sub · axial · 1.2mm · 0.94mm/px · z∈[-79,+35]mm · 4 of 160 slices shown]
[im 1/160]
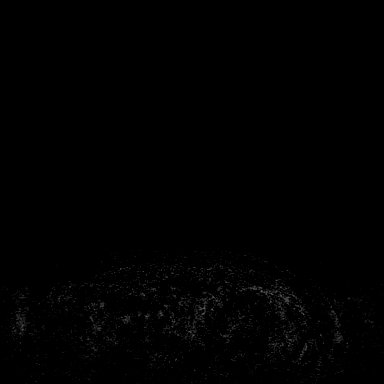
[im 32/160]
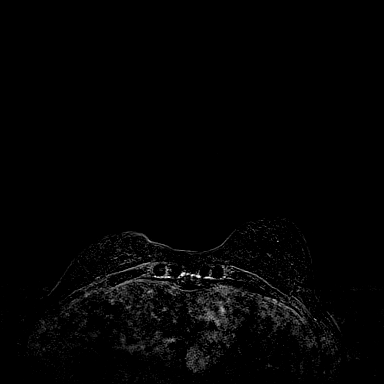
[im 64/160]
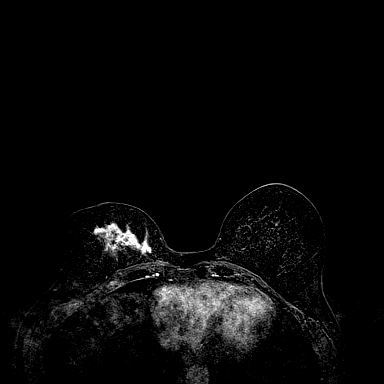
[im 96/160]
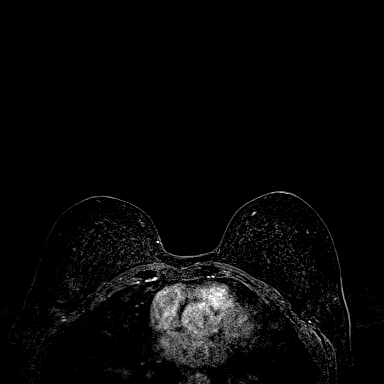

[32 of 48 positions shown; findings below may reference images not displayed]

Three-dimensional MR images were rendered by post-processing of the
original MR data on an independent workstation. The
three-dimensional MR images were interpreted, and findings are
reported in the following complete MRI report for this study. Three
dimensional images were evaluated at the independent [employer]
workstation
FINDINGS: Breast composition: c. Heterogeneous fibroglandular tissue.

Background parenchymal enhancement: Mild.

Right breast: Susceptibility artifact from post biopsy clip is seen
in the far inferior right breast centrally. A suspicious enhancing
mass in association with the post biopsy marking clip involves the
entire lower inner quadrant and portions of the lower outer quadrant
of the right breast. Linear enhancement extends from the mass to the
base of the nipple. There is mild tethering of the nipple without
suspicious enhancement within the nipple or skin. Overall
measurements span 5.5 x 6.2 x 4.5 cm (AP by transverse by
craniocaudal dimensions).

No additional suspicious enhancement is identified within the
superior right breast.

Left breast: No mass or abnormal enhancement.

Lymph nodes: No abnormal appearing lymph nodes.

Ancillary findings:  None.
IMPRESSION: 1. Biopsy proven primary malignancy involving the entire lower inner
quadrant and portions of the lower outer quadrant of the right
breast. Overall measurements span 5.5 x 6.2 x 4.5 cm (AP by
transverse by craniocaudal dimensions).
2. Suspicious linear enhancement extends to the base of the right
nipple with mild tethering. There is no suspicious enhancement of
the overlying skin or nipple itself.
3. No MRI evidence of malignancy on the left.
4. No suspicious lymphadenopathy.

RECOMMENDATION:
Additional MRI guided biopsy at a distant site of enhancement can be
performed to document extent of disease if this will alter clinical
management.

BI-RADS CATEGORY  4: Suspicious.

## 2019-11-23 MED ORDER — GADOBUTROL 1 MMOL/ML IV SOLN
6.0000 mL | Freq: Once | INTRAVENOUS | Status: AC | PRN
  Administered 2019-11-23: 11:00:00 6 mL via INTRAVENOUS

## 2019-11-24 ENCOUNTER — Encounter: Payer: Self-pay | Admitting: *Deleted

## 2019-11-25 ENCOUNTER — Encounter: Payer: Self-pay | Admitting: *Deleted

## 2019-11-29 ENCOUNTER — Encounter: Payer: Self-pay | Admitting: *Deleted

## 2019-12-01 ENCOUNTER — Encounter: Payer: Self-pay | Admitting: *Deleted

## 2019-12-05 ENCOUNTER — Encounter: Payer: Self-pay | Admitting: *Deleted

## 2019-12-06 ENCOUNTER — Other Ambulatory Visit: Payer: Self-pay | Admitting: General Surgery

## 2019-12-06 ENCOUNTER — Other Ambulatory Visit: Payer: Self-pay | Admitting: Hematology and Oncology

## 2019-12-06 DIAGNOSIS — N6310 Unspecified lump in the right breast, unspecified quadrant: Secondary | ICD-10-CM

## 2019-12-06 NOTE — Telephone Encounter (Signed)
Pt started on Anastrozole until MRI final.     Results would determine if she needs to continue anti estrogen, or start systemic chemotherapy.  MRI results final.  Recommendations to continue Anastrozole or chemotherapy?

## 2019-12-07 ENCOUNTER — Other Ambulatory Visit: Payer: Self-pay | Admitting: General Surgery

## 2019-12-07 DIAGNOSIS — R9389 Abnormal findings on diagnostic imaging of other specified body structures: Secondary | ICD-10-CM

## 2019-12-08 ENCOUNTER — Encounter: Payer: Self-pay | Admitting: *Deleted

## 2019-12-20 ENCOUNTER — Other Ambulatory Visit: Payer: Self-pay | Admitting: General Practice

## 2019-12-20 ENCOUNTER — Ambulatory Visit
Admission: RE | Admit: 2019-12-20 | Discharge: 2019-12-20 | Disposition: A | Discharge status: Home / Self Care | Payer: Managed Care, Other (non HMO) | Source: Ambulatory Visit | Attending: General Surgery | Admitting: General Surgery

## 2019-12-20 ENCOUNTER — Other Ambulatory Visit: Payer: Self-pay

## 2019-12-20 DIAGNOSIS — R9389 Abnormal findings on diagnostic imaging of other specified body structures: Secondary | ICD-10-CM

## 2019-12-20 IMAGING — MG MM BREAST LOCALIZATION CLIP
4 series · 4 of 12 positions shown · non-contrast
Comparison: Previous exam(s).

CLINICAL DATA: 63-year-old female with history of newly diagnosed
right breast cancer. Presenting for biopsy in the right breast
determine extent of disease.

EXAM:
DIAGNOSTIC RIGHT MAMMOGRAM POST MRI BIOPSY

[R CC synth-2D]
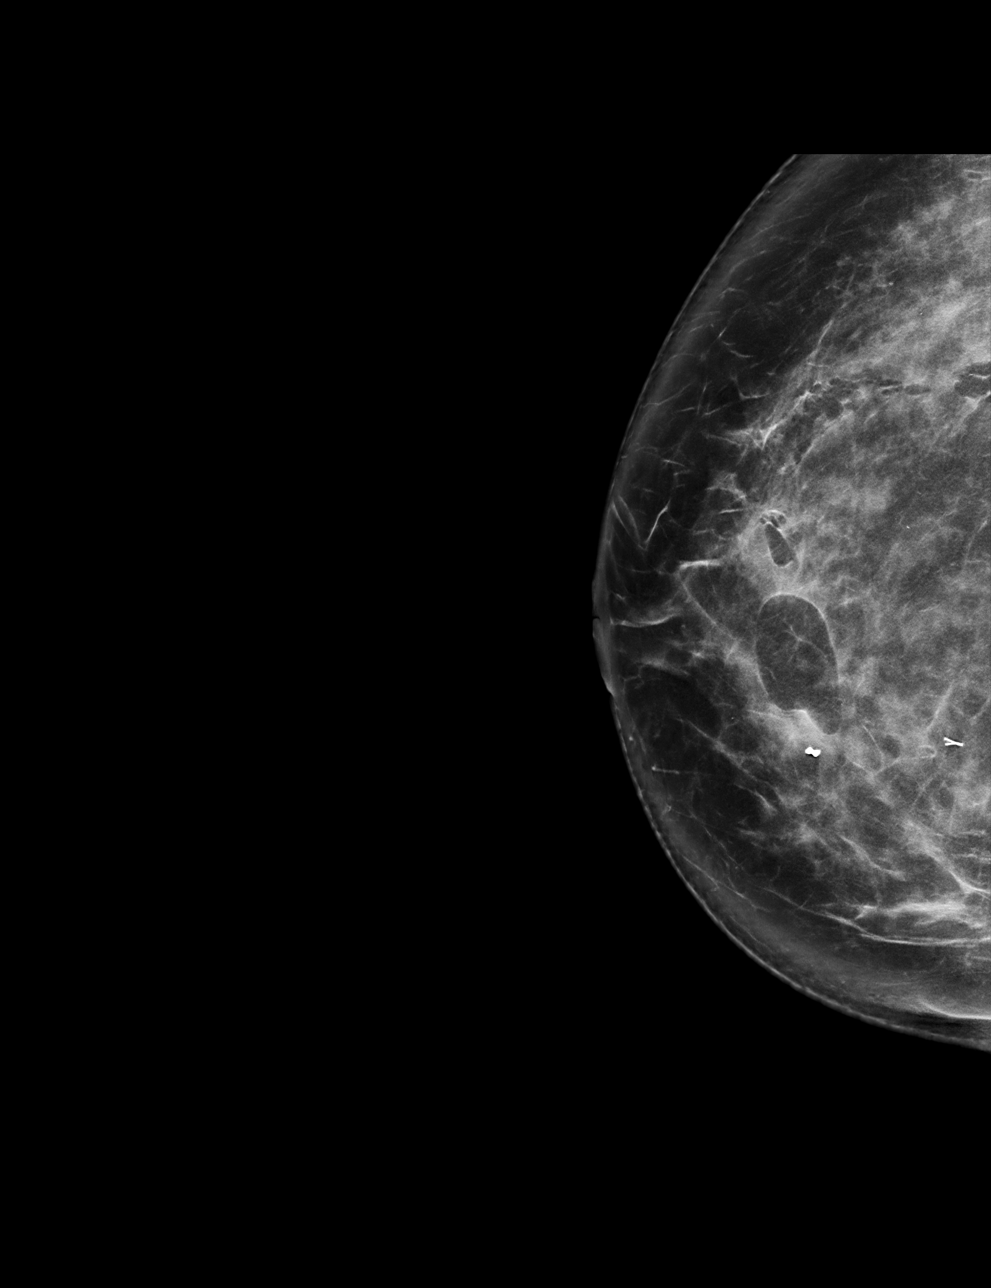

[R ML synth-2D]
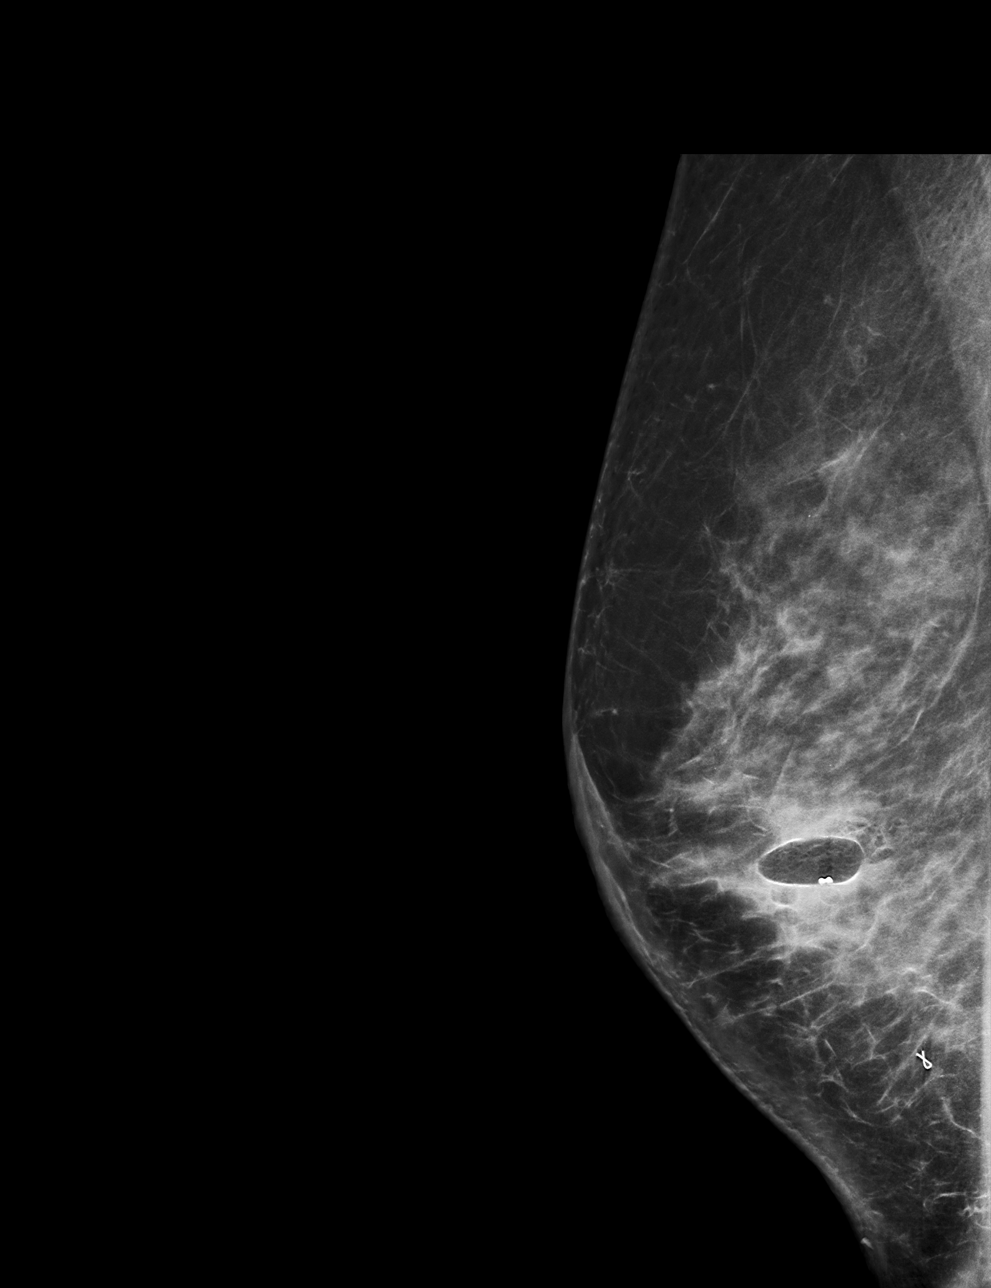

[R ML tomo · tomo slice 42/83.0]
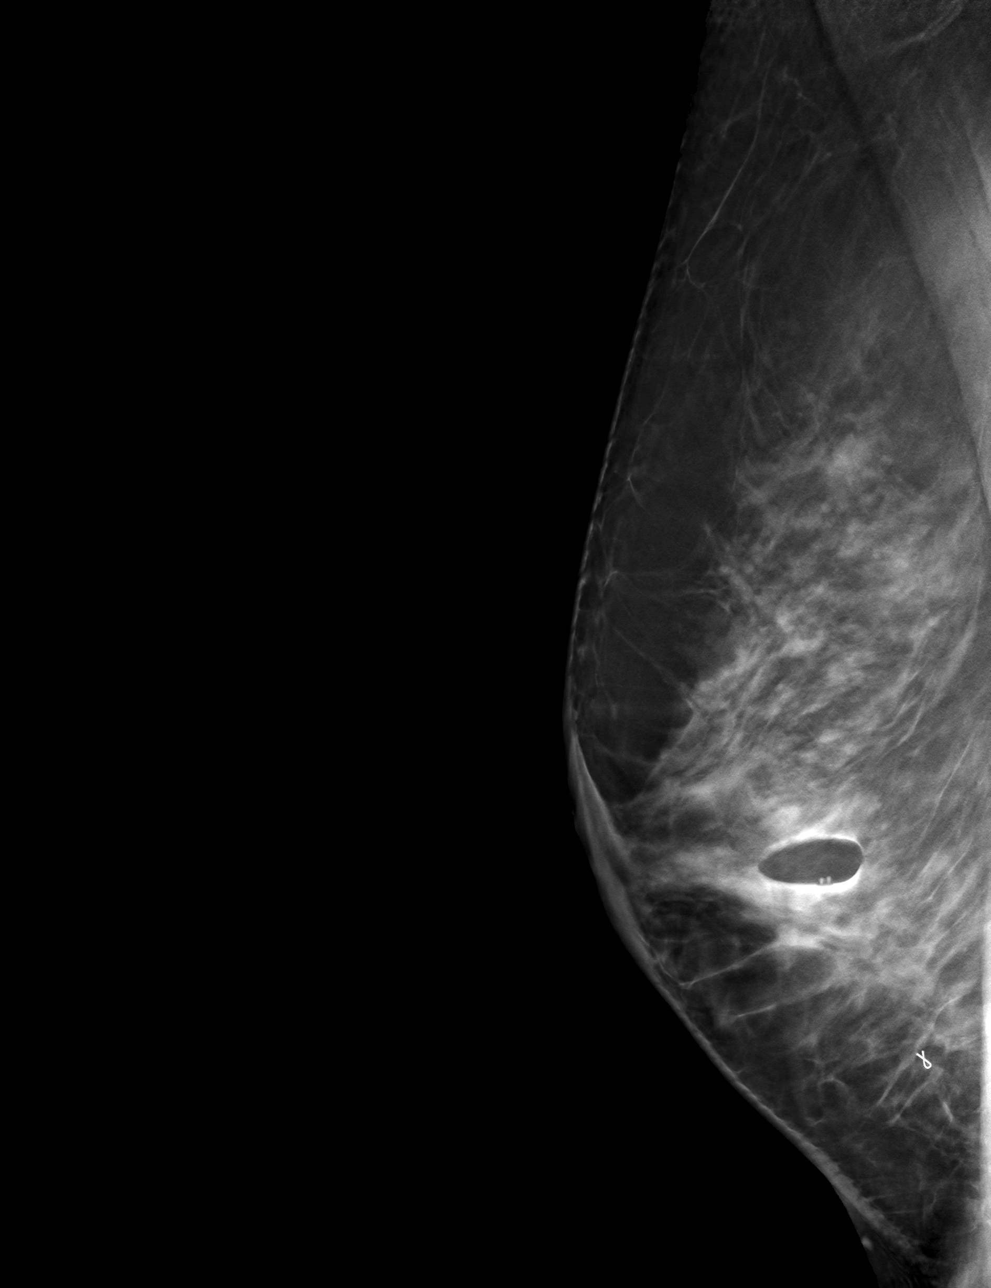

[R CC tomo · tomo slice 43/85.0]
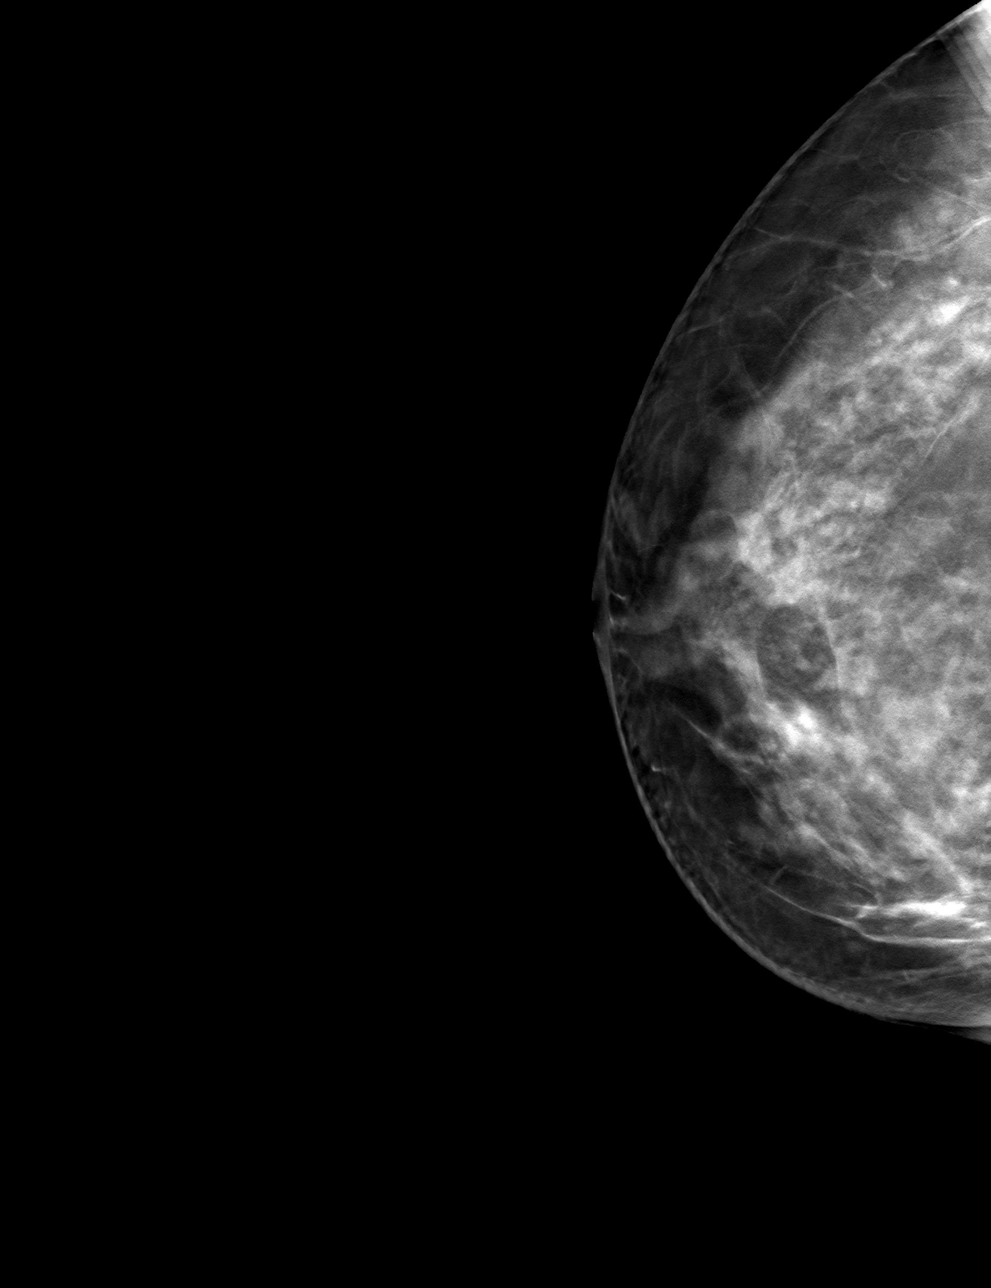

[4 of 12 positions shown; findings below may reference images not displayed]

FINDINGS: Mammographic images were obtained following MRI guided biopsy of non
mass enhancement in the retroareolar/6 o'clock position of the right
breast. The biopsy marking clip is in expected position at the site
of biopsy.
IMPRESSION: Appropriate positioning of the barbell shaped biopsy marking clip at
the site of biopsy in the right breast at 6 o'clock retroareolar.

Final Assessment: Post Procedure Mammograms for Marker Placement

## 2019-12-20 IMAGING — MR MR BREAST BX W/ LOC DEV 1ST LEASION IMAGE BX SPEC MR GUIDE*R*
7 of 10 series · 33 of 48 positions shown · IV contrast (gadavist)
Comparison: Previous exams.
COMPARISON: Previous exams.

Addendum:
CLINICAL DATA: 63-year-old female with history of newly diagnosed
right breast cancer. Presenting for biopsy in the right breast
determine extent of disease.

EXAM:
MRI GUIDED CORE NEEDLE BIOPSY OF THE RIGHT BREAST
TECHNIQUE: Multiplanar, multisequence MR imaging of the right breast was
performed both before and after administration of intravenous
contrast.
CONTRAST:  5mL GADAVIST GADOBUTROL 1 MMOL/ML IV SOLN

[Series 2: fiducial unilateral · sagittal · 2.0mm · 1.33mm/px · 3 of 52 slices shown]
[im 1/52]
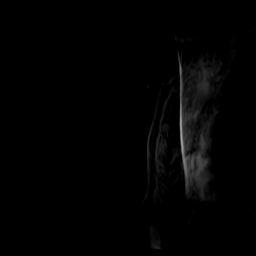
[im 26/52]
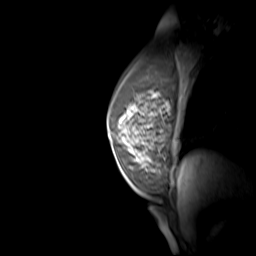
[im 52/52]
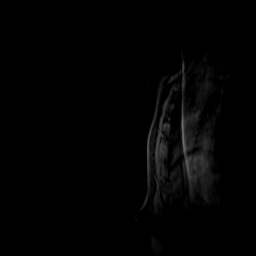

[Series 3: dynamic pre · axial · non-contrast · 1.3mm · 0.73mm/px · z∈[-103,+104]mm · 5 of 160 slices shown]
[im 1/160]
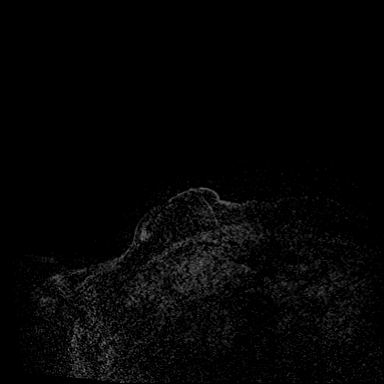
[im 40/160]
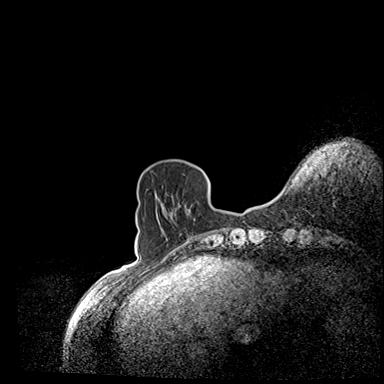
[im 80/160]
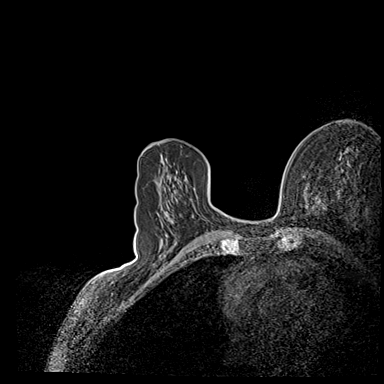
[im 120/160]
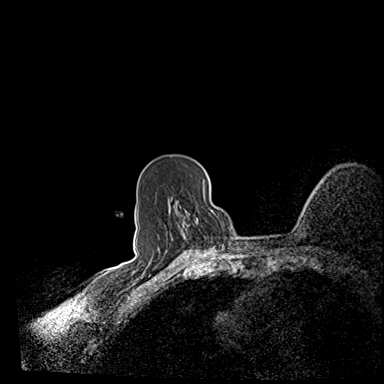
[im 160/160]
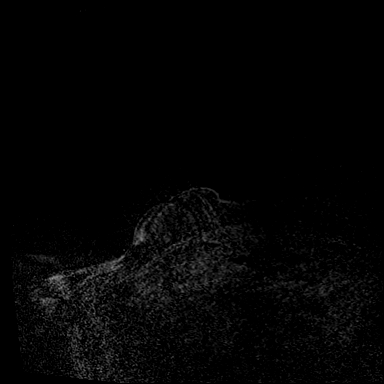

[Series 4: dynamic post 20 · axial · 1.3mm · 0.73mm/px · z∈[-103,+104]mm · 5 of 160 slices shown (1 of 2)]
[im 1/160]
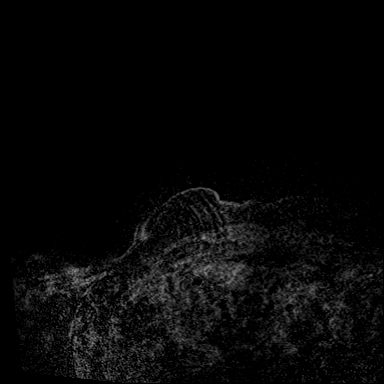
[im 40/160]
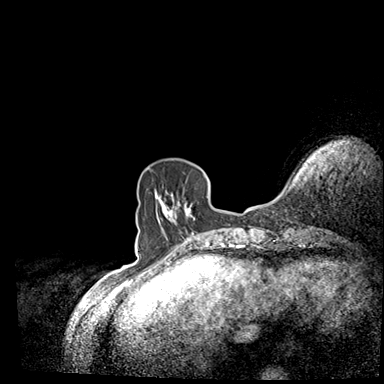
[im 80/160]
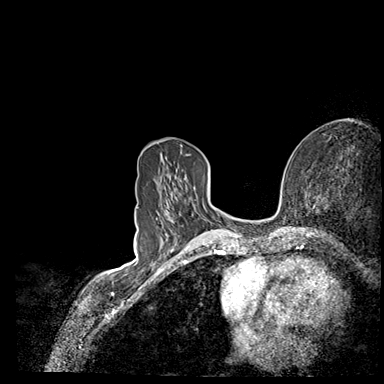
[im 120/160]
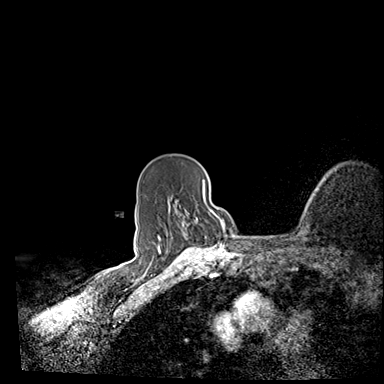
[im 160/160]
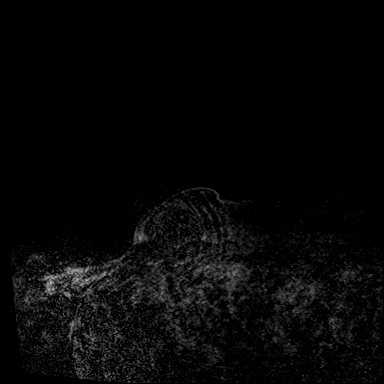

[Series 5: dynamic post 20 · axial · 1.3mm · 0.73mm/px · z∈[-103,+104]mm · 5 of 160 slices shown (2 of 2)]
[im 1/160]
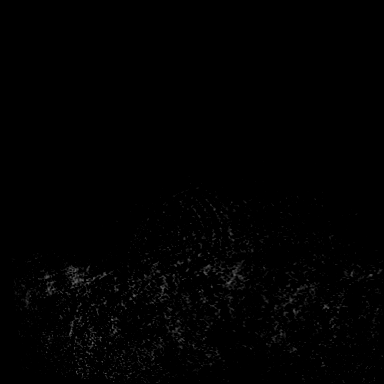
[im 40/160]
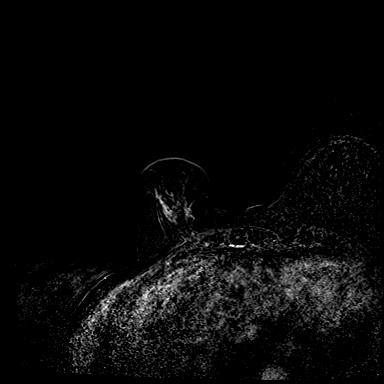
[im 80/160]
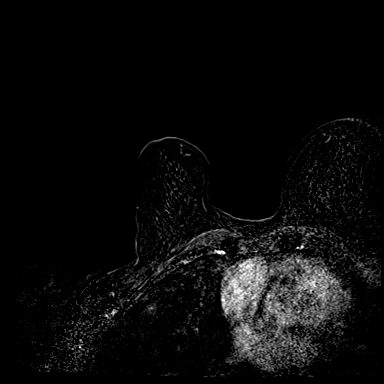
[im 120/160]
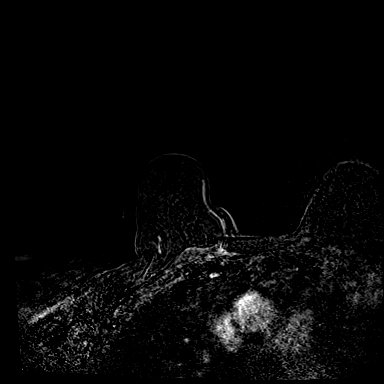
[im 160/160]
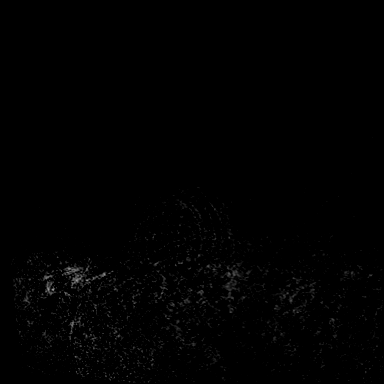

[Series 6: dynamic post 3 · axial · 1.3mm · 0.73mm/px · z∈[-103,+104]mm · 5 of 160 slices shown (1 of 2)]
[im 1/160]
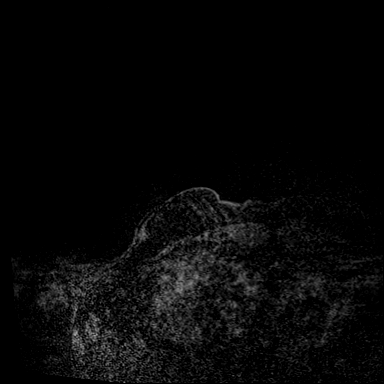
[im 40/160]
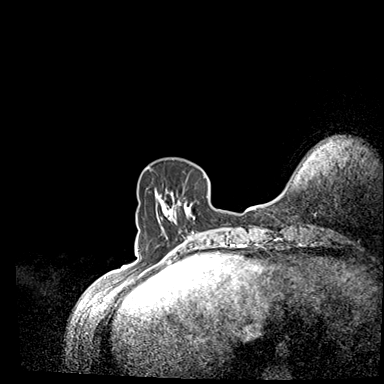
[im 80/160]
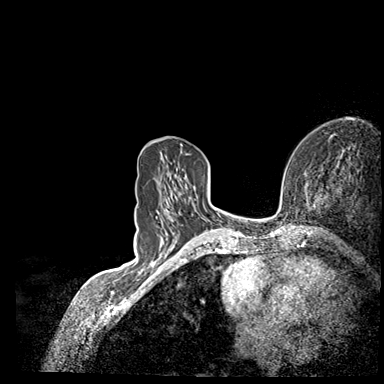
[im 120/160]
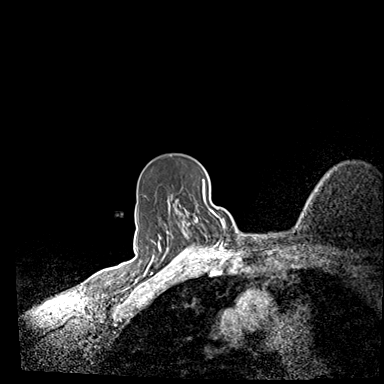
[im 160/160]
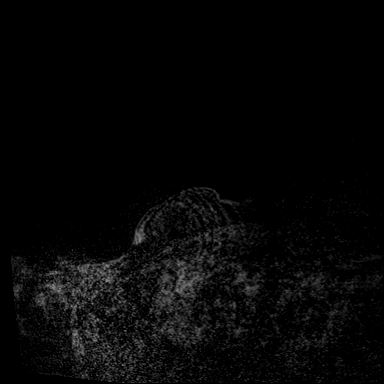

[Series 7: dynamic post 3 · axial · 1.3mm · 0.73mm/px · z∈[-103,+104]mm · 5 of 160 slices shown (2 of 2)]
[im 1/160]
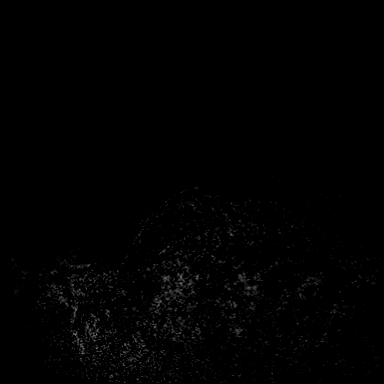
[im 40/160]
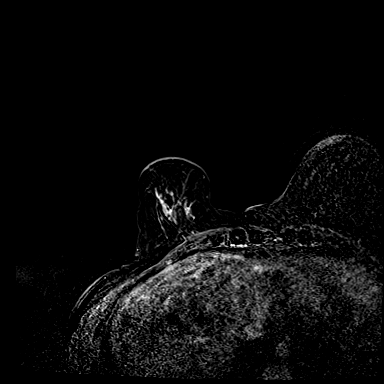
[im 80/160]
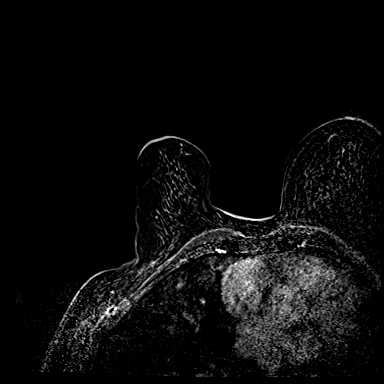
[im 120/160]
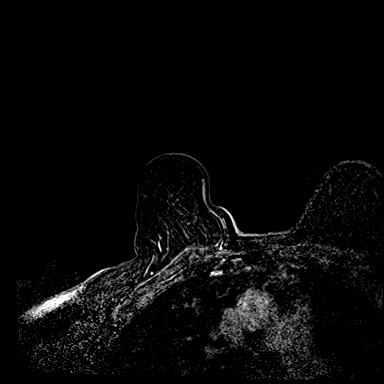
[im 160/160]
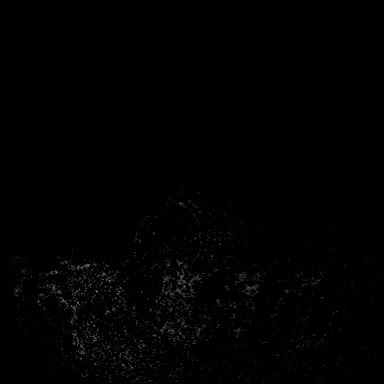

[Series 8: needle confirmation · axial · 1.3mm · 0.73mm/px · z∈[-103,+104]mm · 5 of 160 slices shown]
[im 1/160]
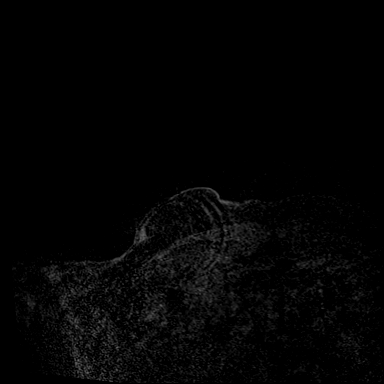
[im 40/160]
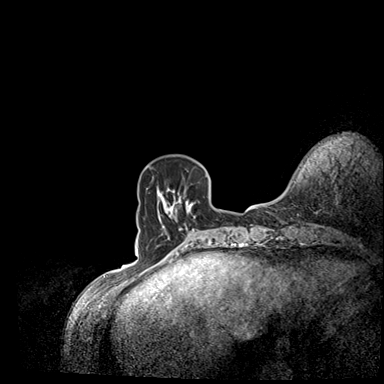
[im 80/160]
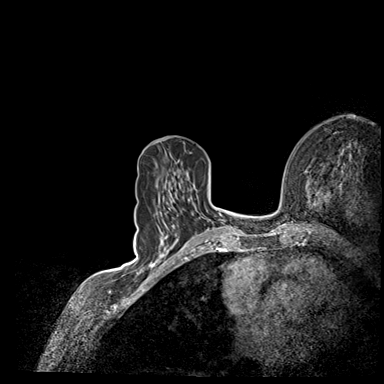
[im 120/160]
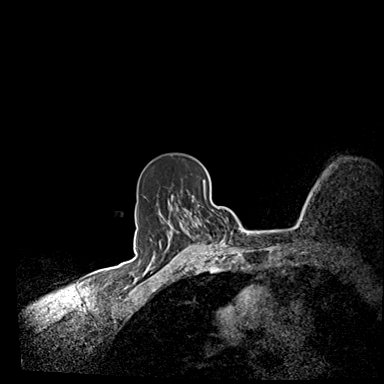
[im 160/160]
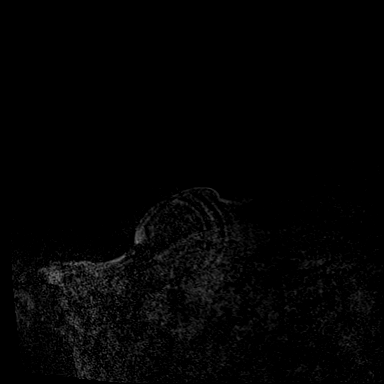

[33 of 48 positions shown; findings below may reference images not displayed]

FINDINGS: I met with the patient, and we discussed the procedure of MRI guided
biopsy, including risks, benefits, and alternatives. Specifically,
we discussed the risks of infection, bleeding, tissue injury, clip
migration, and inadequate sampling. Informed, written consent was
given. The usual time out protocol was performed immediately prior
to the procedure.

Using sterile technique, 1% Lidocaine, MRI guidance, and a 9 gauge
vacuum assisted device, biopsy was performed of non mass enhancement
in the retroareolar/6 o'clock aspect of the right breast using a
lateral approach. At the conclusion of the procedure, a barbell
tissue marker clip was deployed into the biopsy cavity. Follow-up
2-view mammogram was performed and dictated separately.
IMPRESSION: MRI guided biopsy of non mass enhancement in the retroareolar/6
o'clock aspect of the right breast. No apparent complications.

ADDENDUM:
Pathology revealed INVASIVE MAMMARY CARCINOMA WITH LOBULAR FEATURES
of the RIGHT breast, 6 o'clock, retroareolar. This was found to be
concordant by Dr. AGOSTINHO EDUARDO.

Pathology results were discussed with the patient by telephone. The
patient reported doing well after the biopsy with tenderness at the
site. Post biopsy instructions and care were reviewed and questions
were answered. The patient was encouraged to call The [REDACTED]

The patient has a recent diagnosis of RIGHT breast cancer and should
follow her outlined treatment plan.

Pathology results reported by AGOSTINHO EDUARDO RN on [DATE].

*** End of Addendum ***
FINDINGS: I met with the patient, and we discussed the procedure of MRI guided
biopsy, including risks, benefits, and alternatives. Specifically,
we discussed the risks of infection, bleeding, tissue injury, clip
migration, and inadequate sampling. Informed, written consent was
given. The usual time out protocol was performed immediately prior
to the procedure.

Using sterile technique, 1% Lidocaine, MRI guidance, and a 9 gauge
vacuum assisted device, biopsy was performed of non mass enhancement
in the retroareolar/6 o'clock aspect of the right breast using a
lateral approach. At the conclusion of the procedure, a barbell
tissue marker clip was deployed into the biopsy cavity. Follow-up
2-view mammogram was performed and dictated separately.
IMPRESSION: MRI guided biopsy of non mass enhancement in the retroareolar/6
o'clock aspect of the right breast. No apparent complications.

## 2019-12-20 MED ORDER — GADOBUTROL 1 MMOL/ML IV SOLN
5.0000 mL | Freq: Once | INTRAVENOUS | Status: AC | PRN
  Administered 2019-12-20: 5 mL via INTRAVENOUS

## 2019-12-22 ENCOUNTER — Encounter: Payer: Self-pay | Admitting: *Deleted

## 2019-12-27 ENCOUNTER — Encounter: Payer: Self-pay | Admitting: *Deleted

## 2019-12-27 ENCOUNTER — Other Ambulatory Visit: Payer: Self-pay | Admitting: *Deleted

## 2019-12-27 DIAGNOSIS — C50311 Malignant neoplasm of lower-inner quadrant of right female breast: Secondary | ICD-10-CM

## 2020-01-02 ENCOUNTER — Telehealth: Payer: Self-pay | Admitting: *Deleted

## 2020-01-02 NOTE — Telephone Encounter (Signed)
Called pt and discussed with stay on AI for 6 mon then follow up with mammo/US to assess response. Received verbal understanding. Informed pt she can call [facility] and schedule with it's convenient for her.

## 2020-01-16 ENCOUNTER — Telehealth: Payer: Self-pay | Admitting: *Deleted

## 2020-01-16 NOTE — Telephone Encounter (Signed)
Spoke to pt regarding f/u mammo/US to assess response to AI. Pt informed she will call today to scheduled appt for Sept.

## 2020-01-19 ENCOUNTER — Telehealth: Payer: Self-pay | Admitting: Hematology and Oncology

## 2020-01-19 ENCOUNTER — Encounter: Payer: Self-pay | Admitting: *Deleted

## 2020-01-19 NOTE — Telephone Encounter (Signed)
Scheduled apt per 4/1 sch message - unable to reach pt . Left message with appt date and time

## 2020-04-05 ENCOUNTER — Other Ambulatory Visit: Payer: Self-pay | Admitting: Endocrinology

## 2020-04-05 DIAGNOSIS — E049 Nontoxic goiter, unspecified: Secondary | ICD-10-CM

## 2020-05-04 ENCOUNTER — Ambulatory Visit
Admission: RE | Admit: 2020-05-04 | Discharge: 2020-05-04 | Disposition: A | Discharge status: Home / Self Care | Payer: 59 | Source: Ambulatory Visit | Attending: Endocrinology | Admitting: Endocrinology

## 2020-05-04 DIAGNOSIS — E049 Nontoxic goiter, unspecified: Secondary | ICD-10-CM

## 2020-05-04 IMAGING — US US THYROID
1 series · 14 of 25 positions shown · non-contrast
Comparison: [DATE], [DATE], [DATE]

CLINICAL DATA: 63-year-old female with a history of thyroid goiter

EXAM:
THYROID ULTRASOUND
TECHNIQUE: Ultrasound examination of the thyroid gland and adjacent soft
tissues was performed.

[Series 1: us thyroid · 0.07mm/px · 14 of 43 slices shown]
[im 1/43]
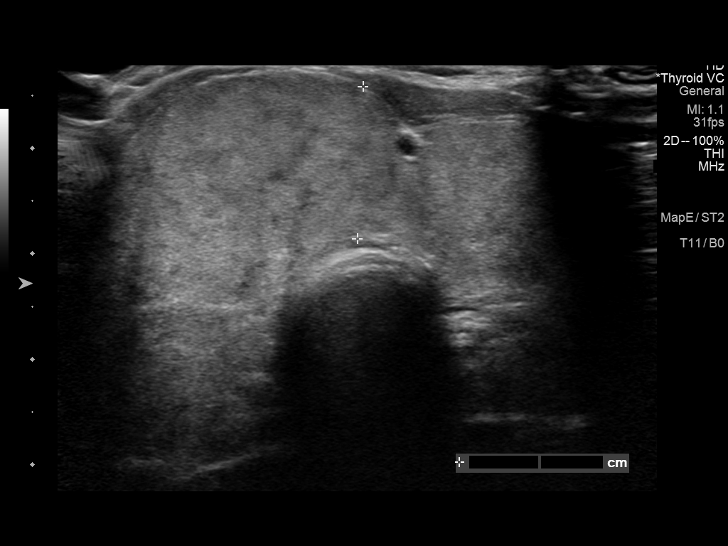
[im 4/43]
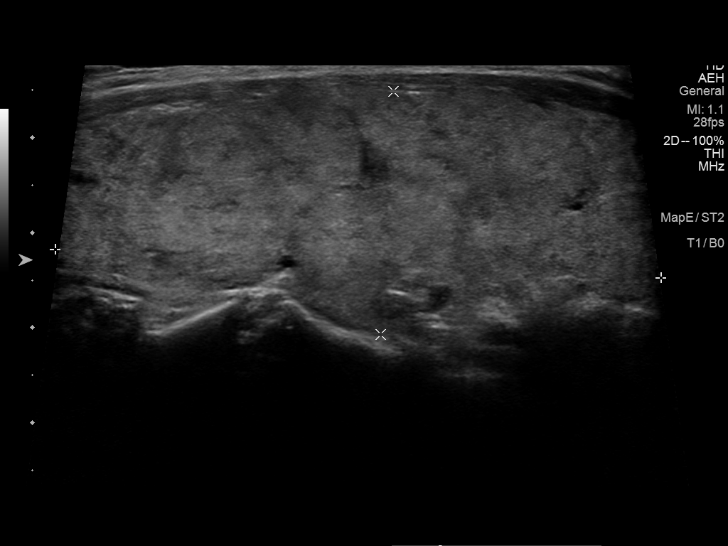
[im 8/43]
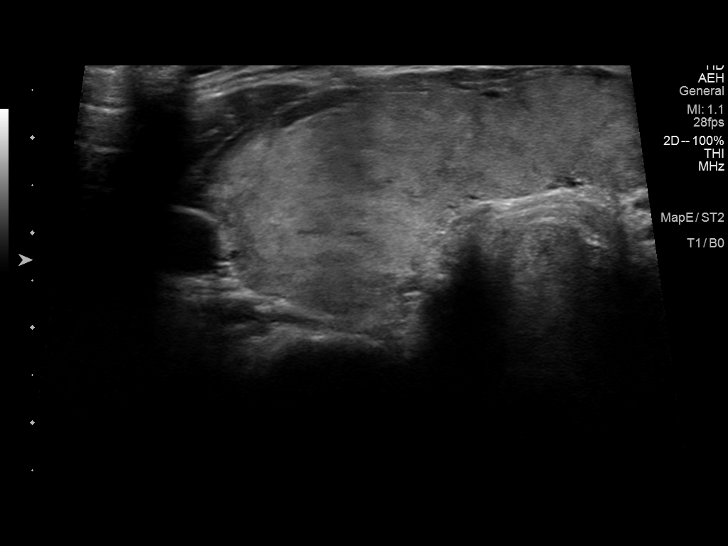
[im 11/43]
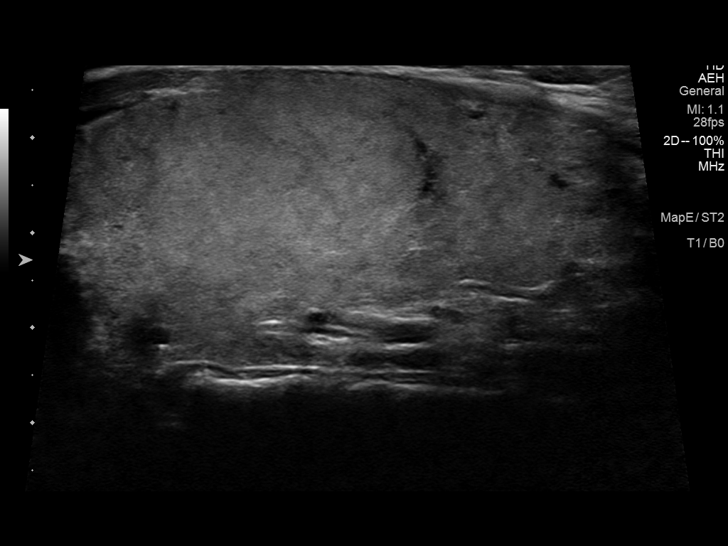
[im 15/43]
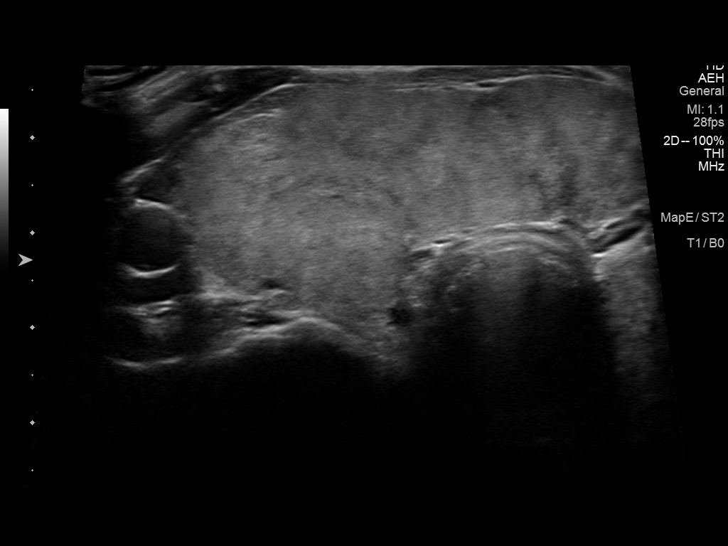
[im 16/43]
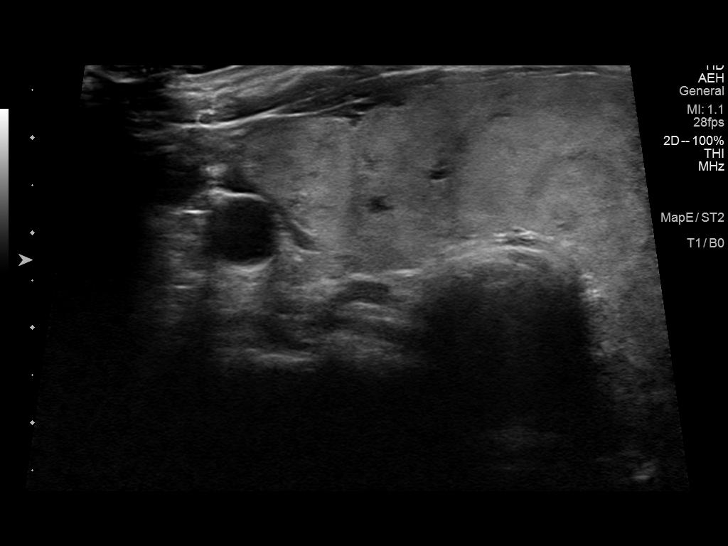
[im 20/43]
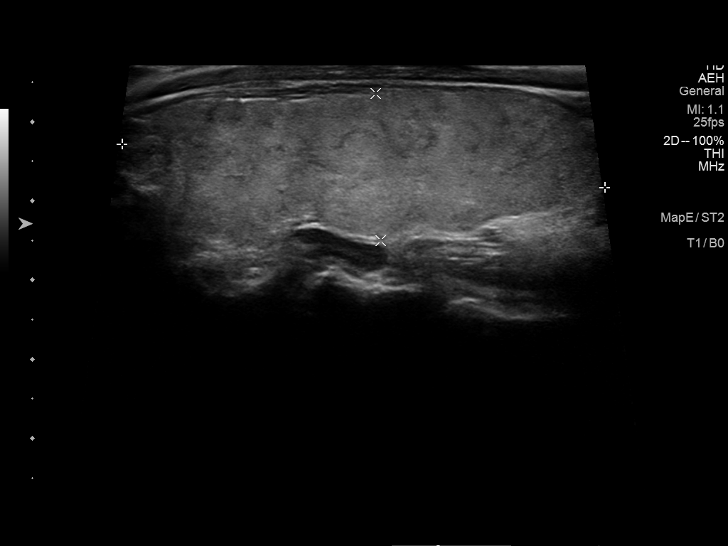
[im 23/43]
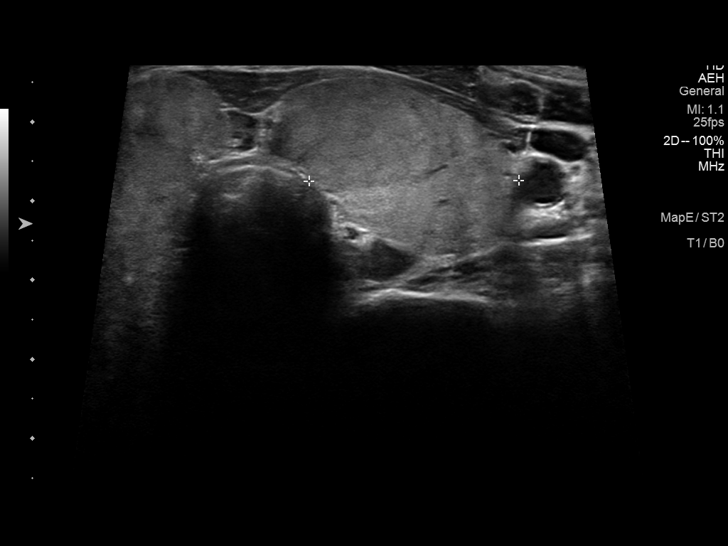
[im 27/43]
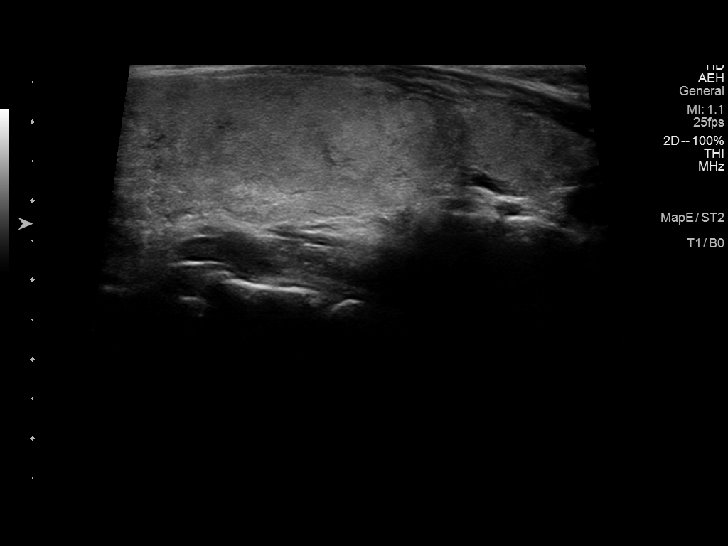
[im 29/43]
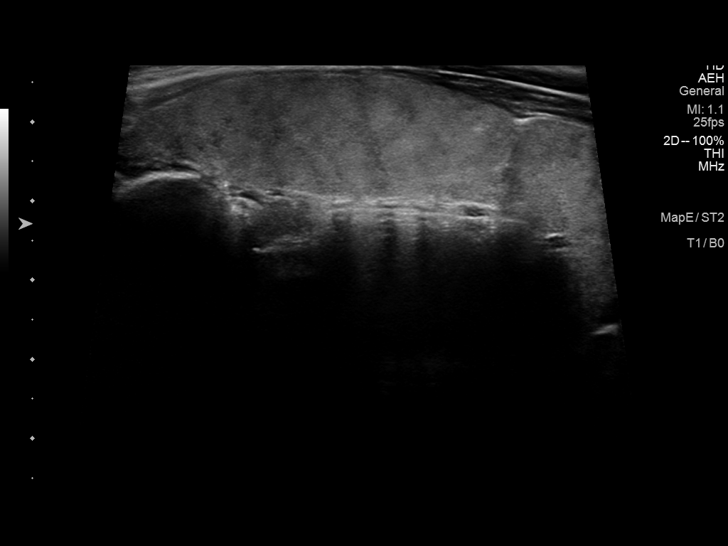
[im 32/43]
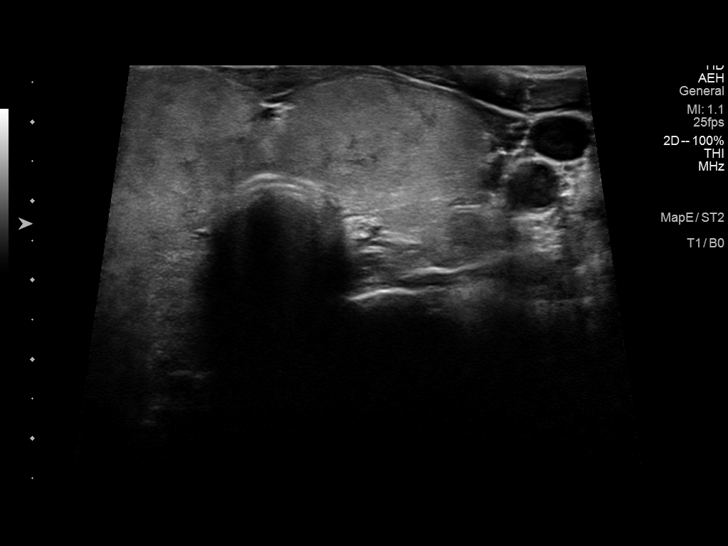
[im 36/43]
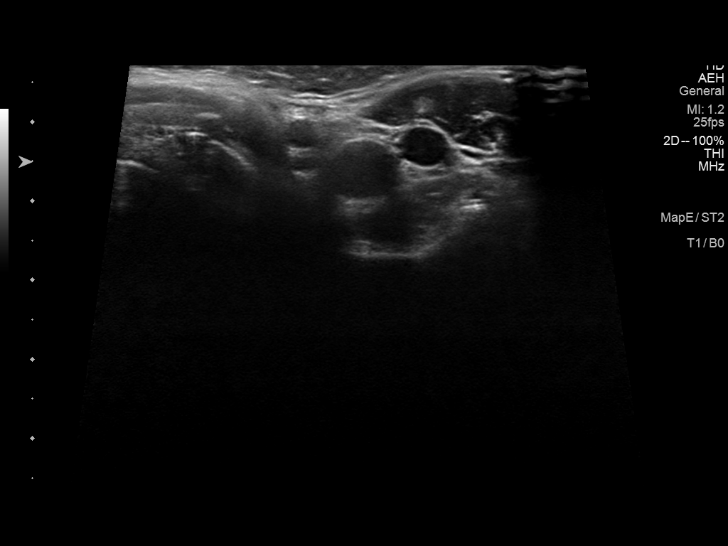
[im 39/43]
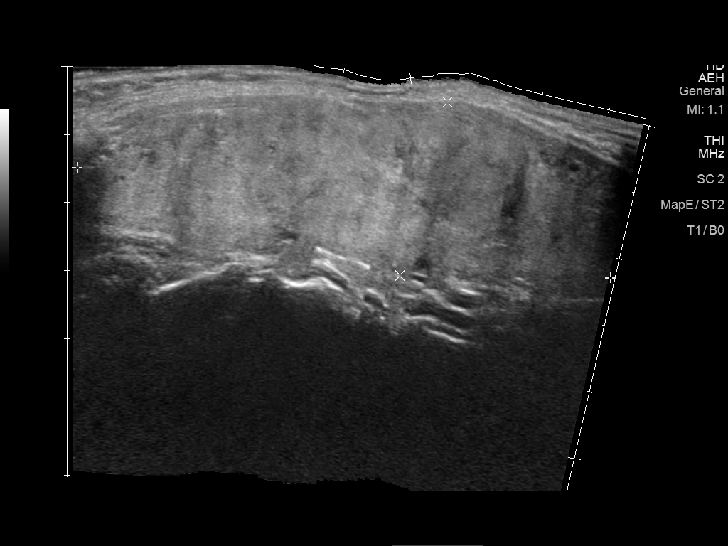
[im 43/43]
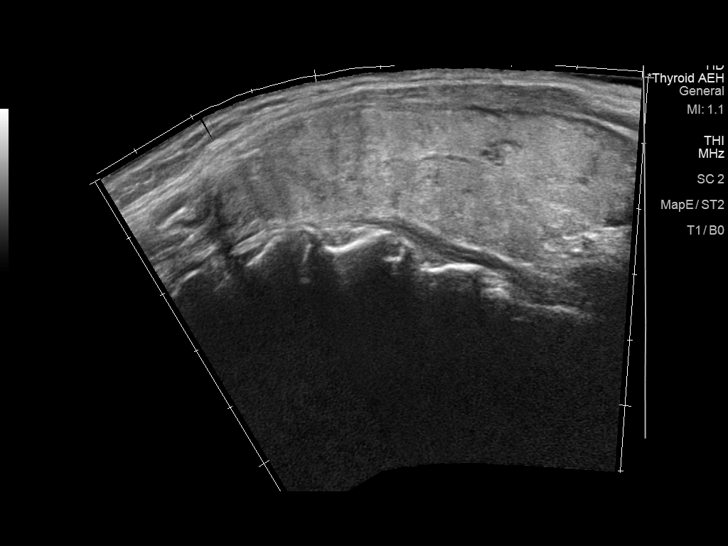

[14 of 25 positions shown; findings below may reference images not displayed]

FINDINGS: Parenchymal Echotexture: Moderately heterogenous

Isthmus: 1.4 cm

Right lobe: 8.0 cm x 2.6 cm x 2.7 cm

Left lobe: 7.0 cm x 2.0 cm x 2.7 cm

_________________________________________________________

Estimated total number of nodules >/= 1 cm: 0

Number of spongiform nodules >/=  2 cm not described below (TR1): 0

Number of mixed cystic and solid nodules >/= 1.5 cm not described
below (TR2): 0

_________________________________________________________

Appearance of the thyroid is unchanged compared to ultrasound survey
of [DATE] and [DATE]. No focal nodule.

No adenopathy
IMPRESSION: Thyroid goiter again demonstrated.

## 2020-06-21 ENCOUNTER — Encounter: Payer: Self-pay | Admitting: *Deleted

## 2020-07-05 ENCOUNTER — Other Ambulatory Visit: Payer: Self-pay

## 2020-07-05 ENCOUNTER — Ambulatory Visit
Admission: RE | Admit: 2020-07-05 | Discharge: 2020-07-05 | Disposition: A | Discharge status: Home / Self Care | Payer: 59 | Source: Ambulatory Visit | Attending: Hematology and Oncology | Admitting: Hematology and Oncology

## 2020-07-05 DIAGNOSIS — C50311 Malignant neoplasm of lower-inner quadrant of right female breast: Secondary | ICD-10-CM

## 2020-07-05 DIAGNOSIS — Z170 Estrogen receptor positive status [ER+]: Secondary | ICD-10-CM

## 2020-07-05 IMAGING — MG MM DIGITAL DIAGNOSTIC UNILAT*R* W/ TOMO W/ CAD
4 series · 4 of 12 positions shown · non-contrast
Comparison: Previous exam(s).

CLINICAL DATA: Follow-up known right breast cancer. Of note, the
patient has known right breast cancer was noted to be dramatically
larger on MRI versus mammography. As a result, follow-up with
mammogram and ultrasound is limited.

EXAM:
DIGITAL DIAGNOSTIC RIGHT MAMMOGRAM WITH CAD AND TOMO
ULTRASOUND RIGHT BREAST

[R MLO synth-2D]
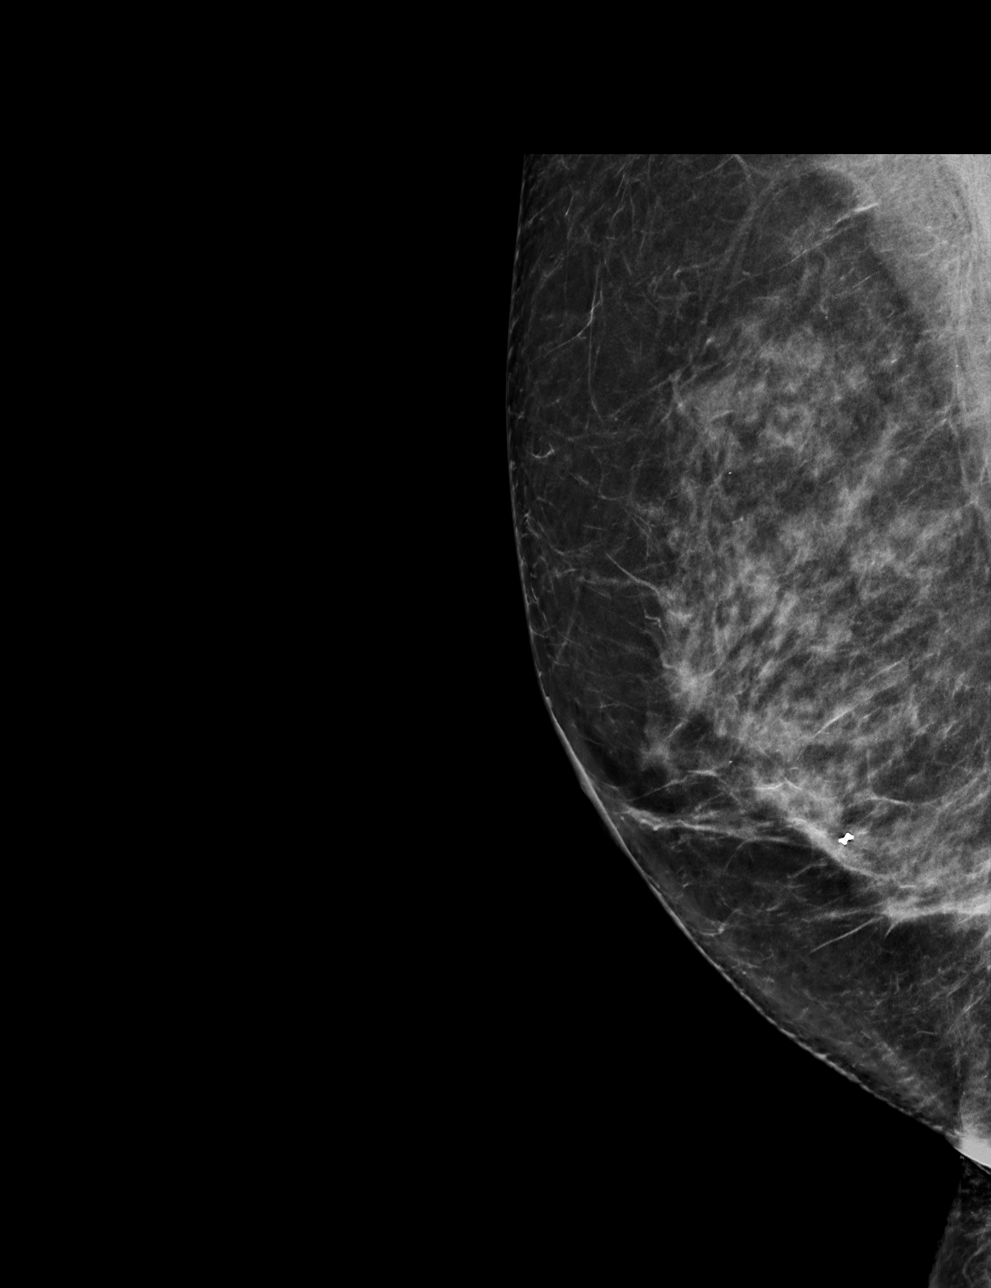

[R CC synth-2D]
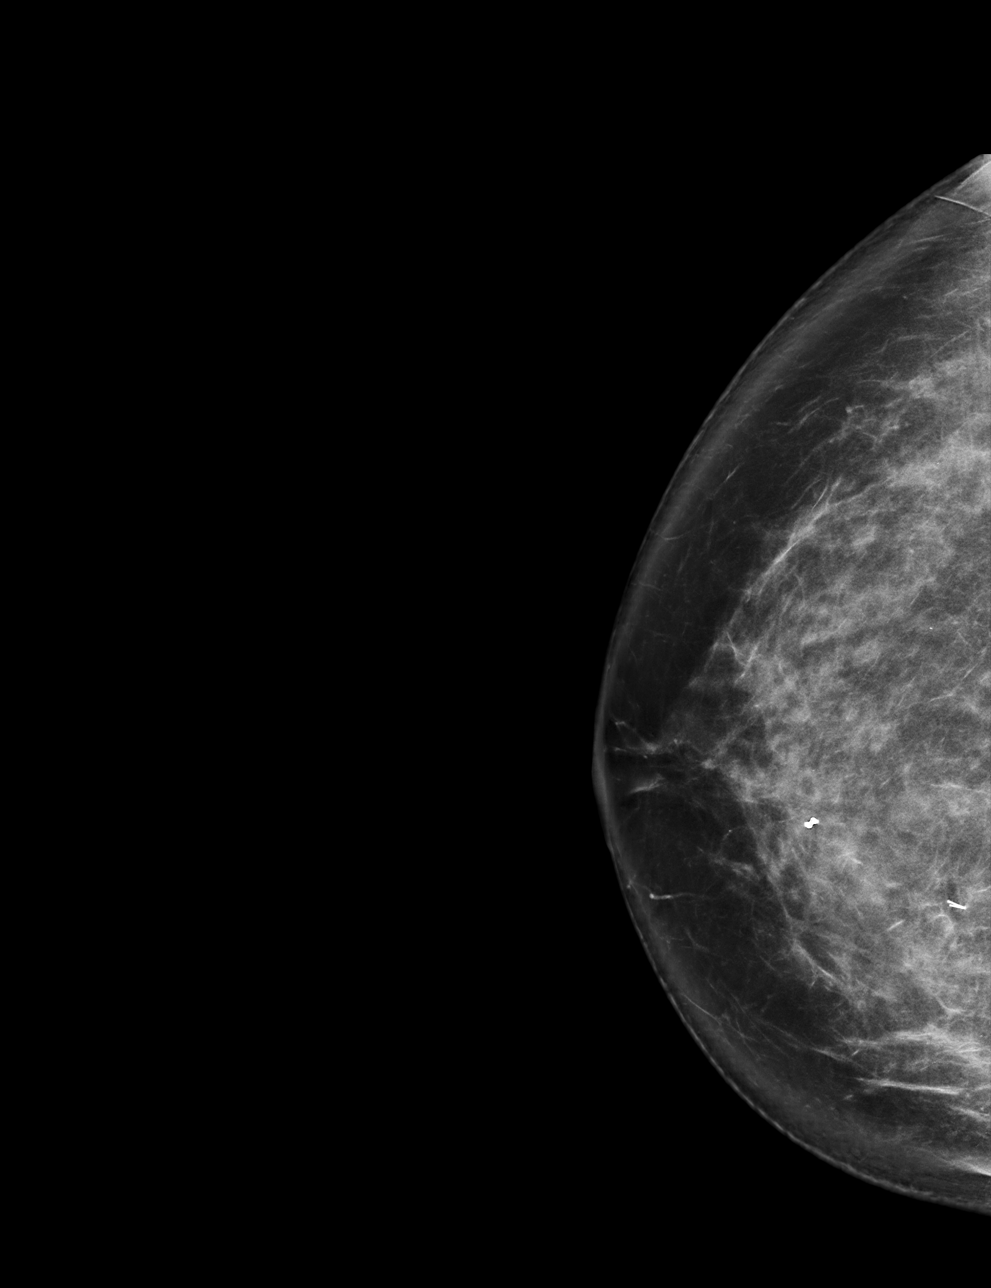

[R CC tomo · tomo slice 45/88.0]
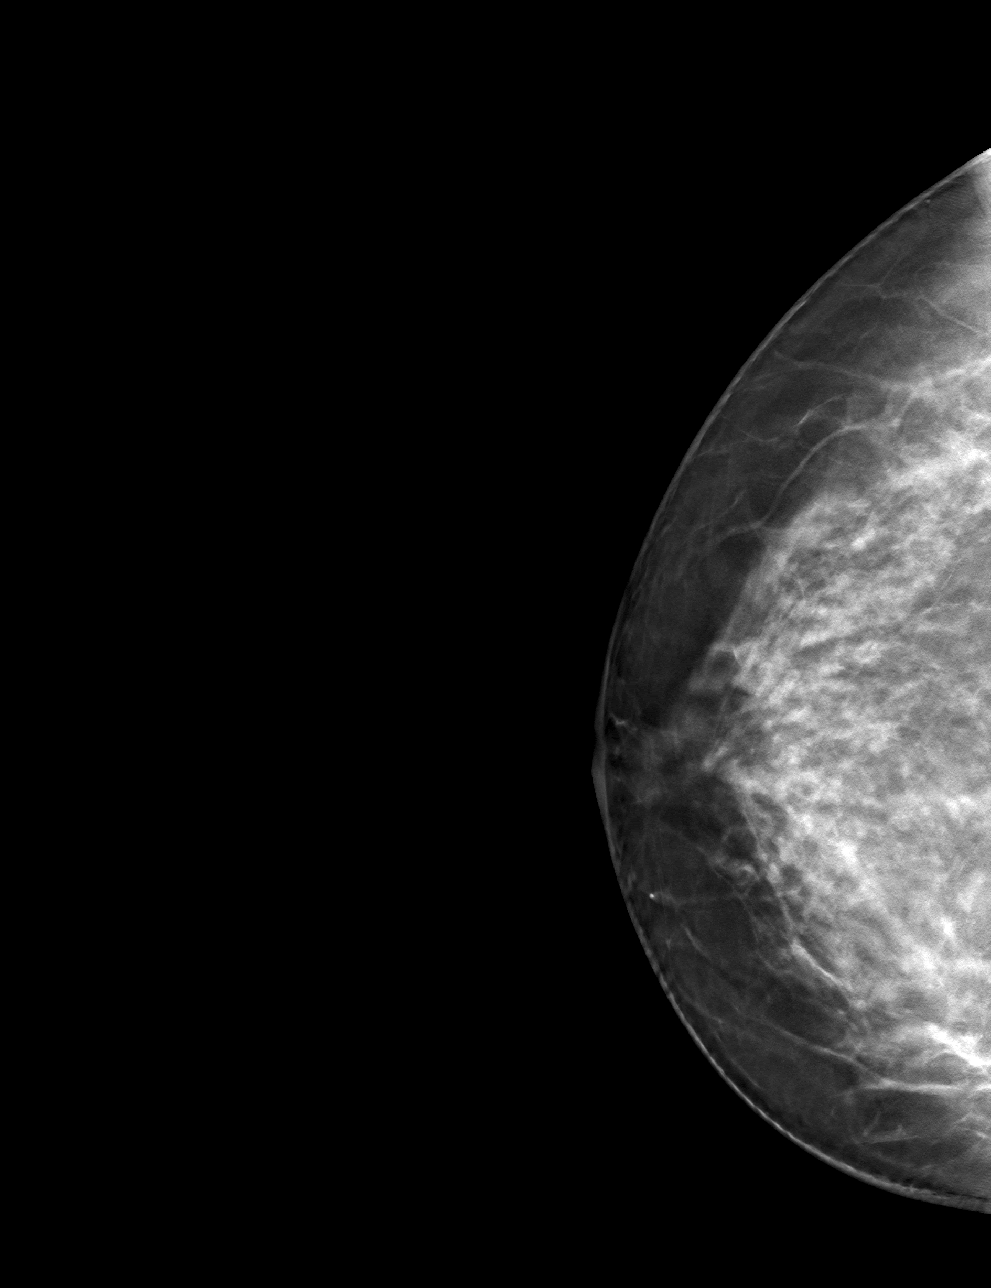

[R MLO tomo · tomo slice 39/77.0]
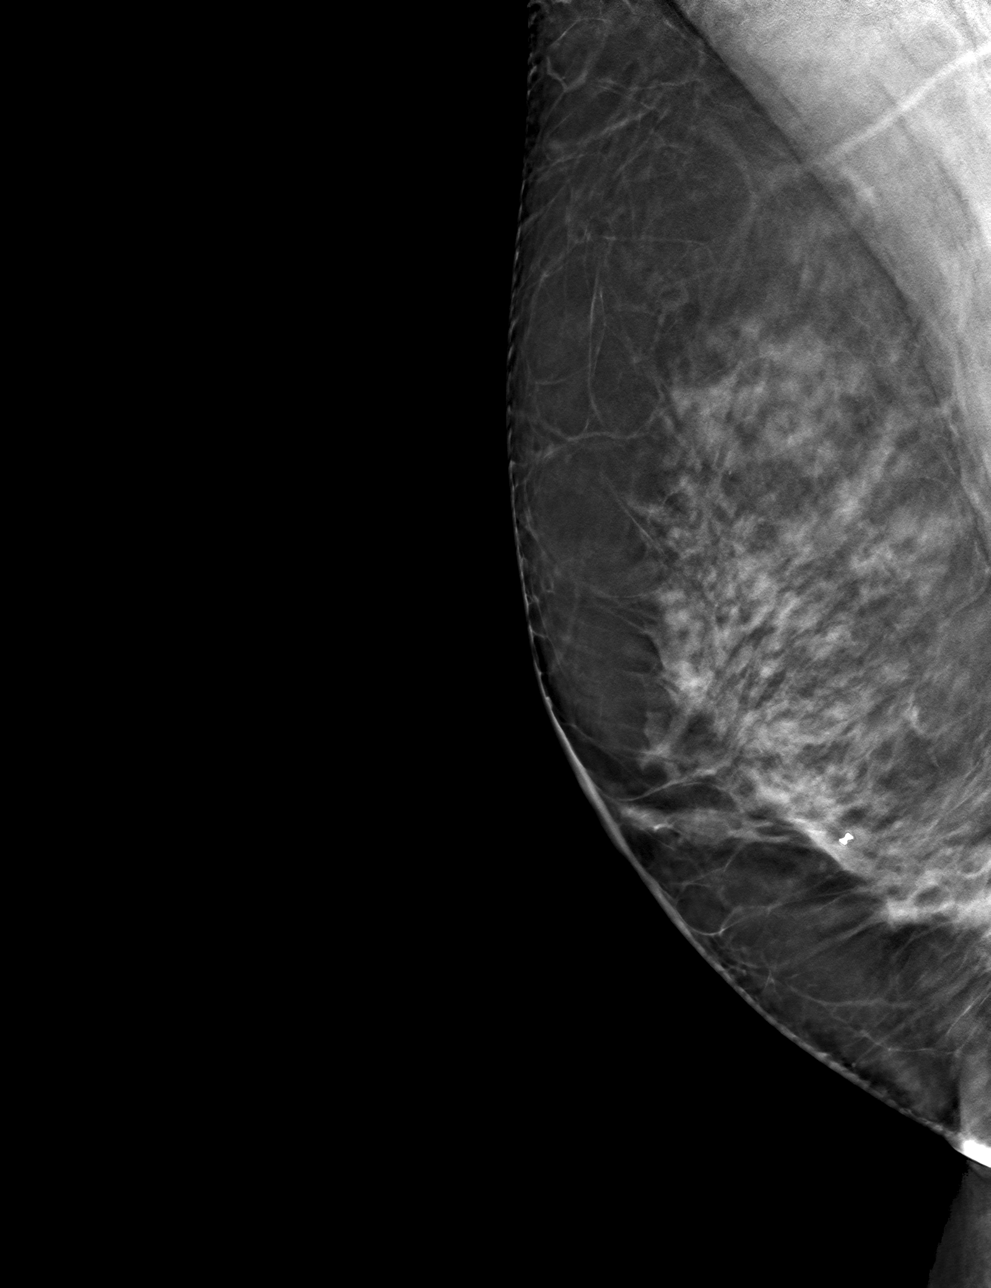

[4 of 12 positions shown; findings below may reference images not displayed]

ACR Breast Density Category c: The breast tissue is heterogeneously
dense, which may obscure small masses.
FINDINGS: Distortion remains at the site of the patient's known malignancy,
similar on the cc view but improved on the MLO view today. No other
suspicious findings in the right breast. Biopsy clips are noted.

Mammographic images were processed with CAD.

On physical exam, the patient states that her lump has resolved.

Targeted ultrasound is performed, showing significant interval
improvement. The discrete right breast mass seen at 4 o'clock, 4 cm
from the nipple measures 5 x 8 x 7 mm today versus 15 x 17 x 18 mm
previously, much smaller in the interval. The surrounding shadowing
tissue has significantly improved as well.
IMPRESSION: Evaluation for interval changes is limited on mammography as the
majority of the patient's tumor was only seen with breast MRI.
Taking this limitation into account, there has been interval
improvement, particularly sonographically and on the right MLO view
of the mammogram.

RECOMMENDATION:
Recommend continued oncologic follow up. MRI could be performed if
more accurate comparison or evaluation is necessary.

I have discussed the findings and recommendations with the patient.
If applicable, a reminder letter will be sent to the patient
regarding the next appointment.

BI-RADS CATEGORY  6: Known biopsy-proven malignancy.

## 2020-07-05 IMAGING — US US BREAST*R* LIMITED INC AXILLA
1 series · 10 of 10 positions shown · non-contrast
Comparison: Previous exam(s).

CLINICAL DATA: Follow-up known right breast cancer. Of note, the
patient has known right breast cancer was noted to be dramatically
larger on MRI versus mammography. As a result, follow-up with
mammogram and ultrasound is limited.

EXAM:
DIGITAL DIAGNOSTIC RIGHT MAMMOGRAM WITH CAD AND TOMO
ULTRASOUND RIGHT BREAST

[Series 1: us breast*right* limited inc axilla · 0.06mm/px · 10 of 10 slices shown]
[im 1/10]
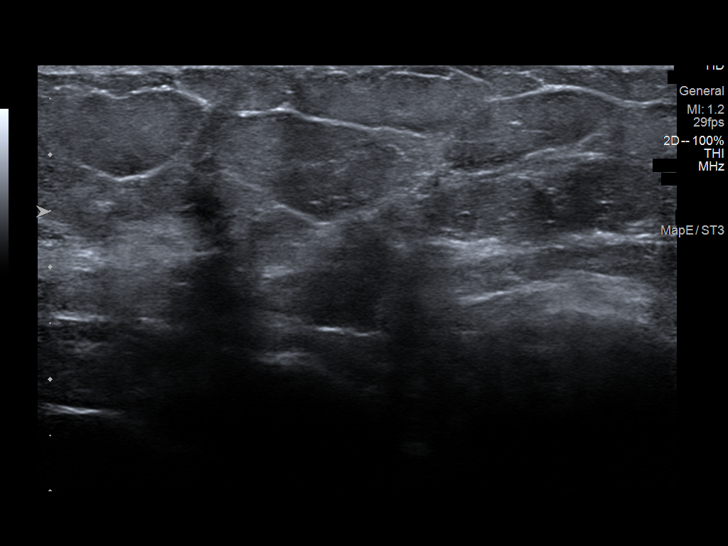
[im 2/10]
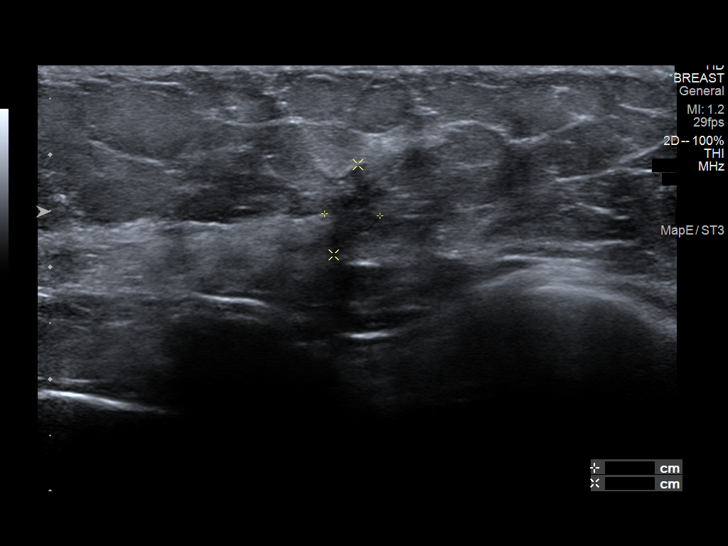
[im 3/10]
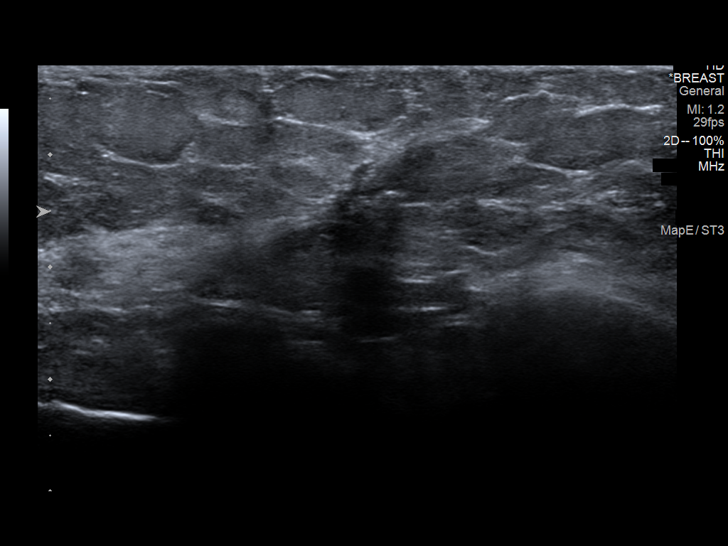
[im 4/10]
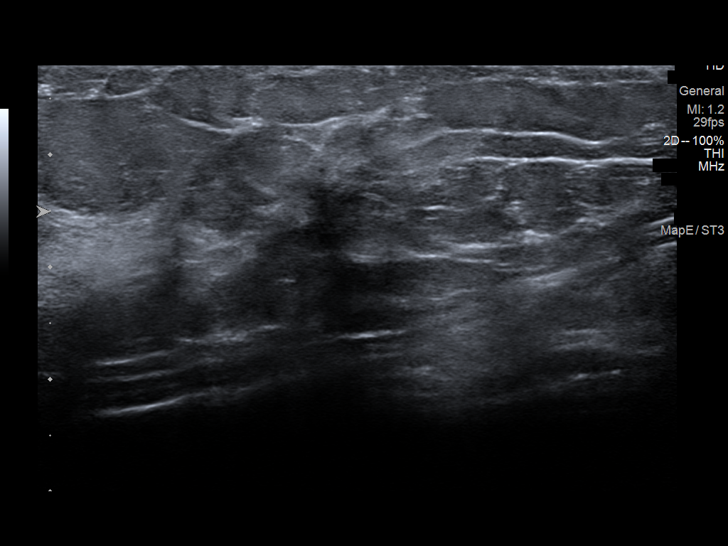
[im 5/10]
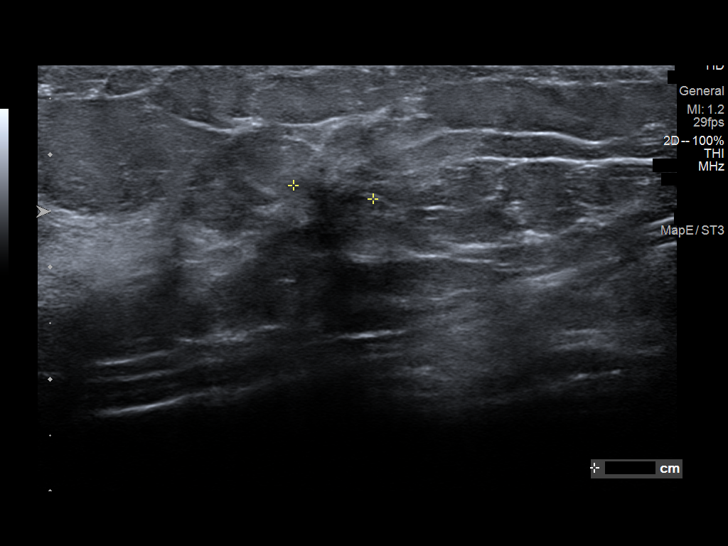
[im 6/10]
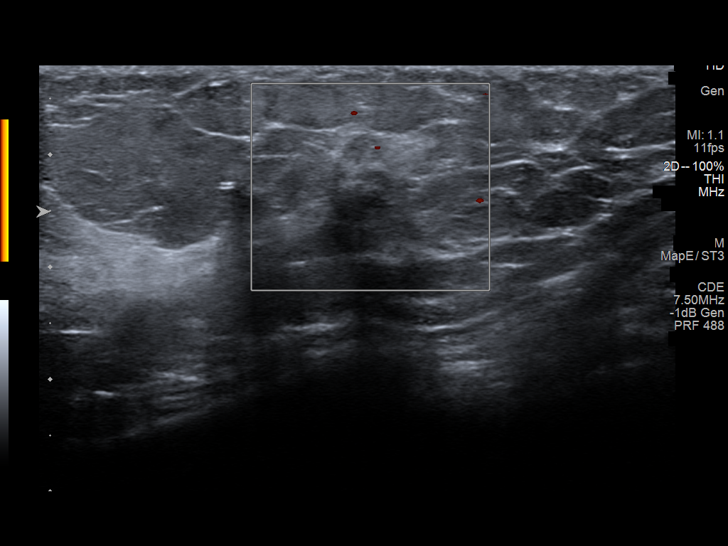
[im 7/10]
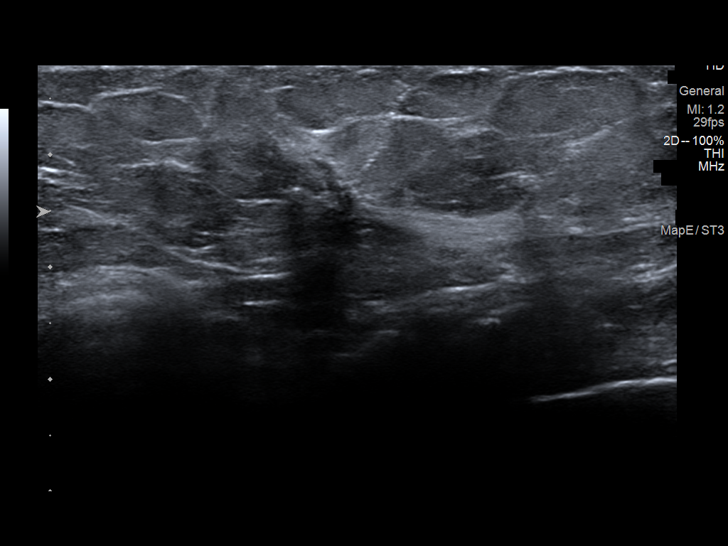
[im 8/10]
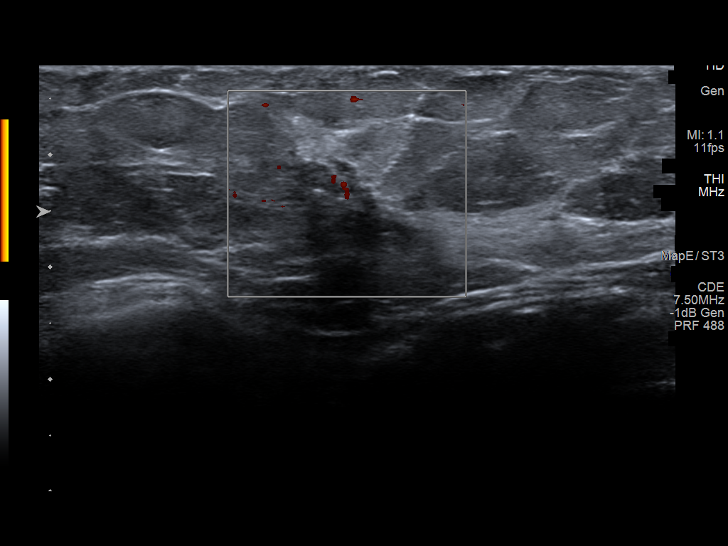
[im 9/10]
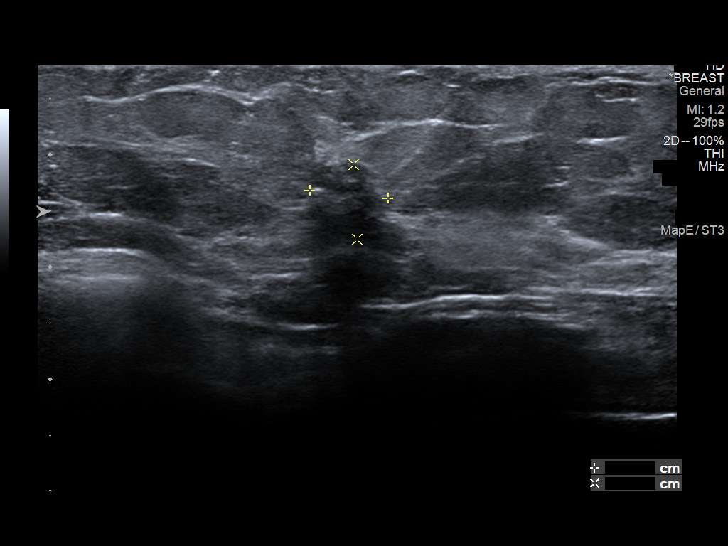
[im 10/10]
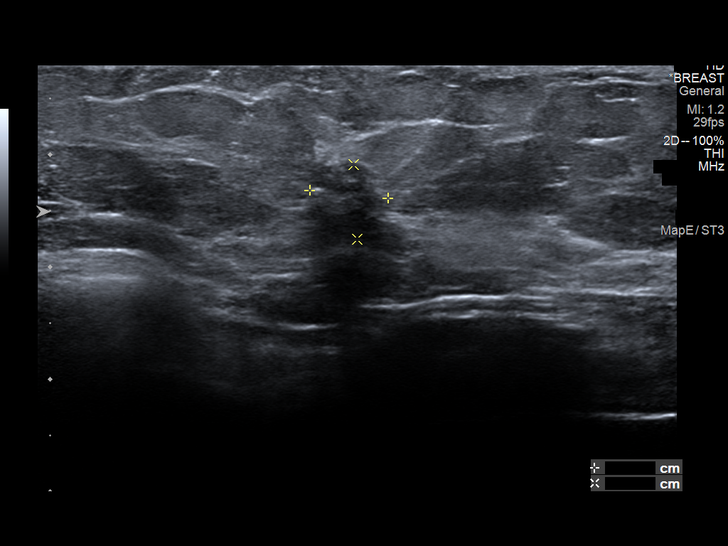

[10 of 10 positions shown; findings below may reference images not displayed]

ACR Breast Density Category c: The breast tissue is heterogeneously
dense, which may obscure small masses.
FINDINGS: Distortion remains at the site of the patient's known malignancy,
similar on the cc view but improved on the MLO view today. No other
suspicious findings in the right breast. Biopsy clips are noted.

Mammographic images were processed with CAD.

On physical exam, the patient states that her lump has resolved.

Targeted ultrasound is performed, showing significant interval
improvement. The discrete right breast mass seen at 4 o'clock, 4 cm
from the nipple measures 5 x 8 x 7 mm today versus 15 x 17 x 18 mm
previously, much smaller in the interval. The surrounding shadowing
tissue has significantly improved as well.
IMPRESSION: Evaluation for interval changes is limited on mammography as the
majority of the patient's tumor was only seen with breast MRI.
Taking this limitation into account, there has been interval
improvement, particularly sonographically and on the right MLO view
of the mammogram.

RECOMMENDATION:
Recommend continued oncologic follow up. MRI could be performed if
more accurate comparison or evaluation is necessary.

I have discussed the findings and recommendations with the patient.
If applicable, a reminder letter will be sent to the patient
regarding the next appointment.

BI-RADS CATEGORY  6: Known biopsy-proven malignancy.

## 2020-07-06 ENCOUNTER — Encounter: Payer: Self-pay | Admitting: *Deleted

## 2020-07-08 NOTE — Progress Notes (Signed)
Patient Care Team: Kristen Loader, FNP as PCP - General (Family Medicine) Mauro Kaufmann, RN as Oncology Nurse Navigator Rockwell Germany, RN as Oncology Nurse Navigator  DIAGNOSIS:    ICD-10-CM   1. Malignant neoplasm of lower-inner quadrant of right breast of female, estrogen receptor positive ([place])  C50.311    Z17.0     SUMMARY OF ONCOLOGIC HISTORY: Oncology History  Malignant neoplasm of lower-inner quadrant of right breast of female, estrogen receptor positive ([place])  10/27/2019 Cancer Staging   Staging form: Breast, AJCC 8th Edition - Clinical stage from 10/27/2019: Stage IA (cT1c, cN0, cM0, G2, ER+, PR+, HER2-) - Signed by Gardenia Phlegm, NP on 11/02/2019   11/02/2019 Initial Diagnosis   Screening mammogram detected a right breast distortion. Diagnostic mammogram showed a 1.8cm right breast mass, 4:00 position, with abnormal tissue surrounding the mass measuring a total of 4.9cm, no right axillary adenopathy. Biopsy showed invasive mammary carcinoma with lobular features, grade 2, HER-2 - (1+), ER+ 95%, PR+ 2%, KI67 5%.      CHIEF COMPLIANT: Follow-up of right breast cancer on neoadjuvant anastrozole  INTERVAL HISTORY: Sydney Moody is a 64 y.o. with above-mentioned history of right breast cancer currently on neoadjuvant antiestrogen therapy with anastrozole. Mammogram on 07/05/20 showed interval improvement with the mass measuring 0.8cm, previously 1.8cm. She presents to the clinic today for follow-up.   ALLERGIES:  is allergic to iodine.  MEDICATIONS:  Current Outpatient Medications  Medication Sig Dispense Refill  . ALPRAZolam (XANAX) 0.5 MG tablet Take 0.5 mg by mouth 2 (two) times daily as needed for anxiety.    Marland Kitchen anastrozole (ARIMIDEX) 1 MG tablet TAKE ONE TABLET BY MOUTH DAILY 30 tablet 0  . B Complex Vitamins (B COMPLEX 1 PO) Take by mouth.    . cyclobenzaprine (FLEXERIL) 10 MG tablet Take 10 mg by mouth once.    . diphenhydrAMINE HCl (BENADRYL ALLERGY  PO) Take by mouth.    . Ferrous Gluconate-C-Folic Acid (IRON-C PO) Take by mouth.    . fluticasone (FLONASE) 50 MCG/ACT nasal spray Place 1 spray into both nostrils daily.    . Homeopathic Products (ZICAM COLD REMEDY PO) Take by mouth.    Marland Kitchen ibuprofen (ADVIL,MOTRIN) 200 MG tablet Take 200 mg by mouth every 6 (six) hours as needed.    . Levothyroxine Sodium (LEVOTHROID PO) Take 50 mcg by mouth.    . metroNIDAZOLE (METROCREAM) 0.75 % cream Apply 1 application topically 2 (two) times daily.    . NON FORMULARY     . NON FORMULARY Apply 1 application topically.    . Omega-3 1000 MG CAPS Take by mouth.     No current facility-administered medications for this visit.    PHYSICAL EXAMINATION: ECOG PERFORMANCE STATUS: 1 - Symptomatic but completely ambulatory  Vitals:   07/09/20 1524  BP: (!) 147/74  Pulse: 79  Resp: 17  Temp: 97.7 F (36.5 C)  SpO2: 100%   Filed Weights   07/09/20 1524  Weight: 149 lb 4.8 oz (67.7 kg)     LABORATORY DATA:  I have reviewed the data as listed CMP Latest Ref Rng & Units 04/24/2009  Glucose 70 - 99 mg/dL 85  BUN 6 - 23 mg/dL 16  Creatinine 0.4 - 1.2 mg/dL 1.0  Sodium 135 - 145 meq/L 144  Potassium 3.5 - 5.1 meq/L 4.6  Chloride 96 - 112 meq/L 106  CO2 19 - 32 meq/L 31  Calcium 8.4 - 10.5 mg/dL 9.4    Lab  Results  Component Value Date   WBC 6.1 05/14/2009   HGB 13.4 05/14/2009   HCT 38.7 05/14/2009   MCV 93.0 05/14/2009   PLT 217.0 05/14/2009   NEUTROABS 4.2 05/14/2009    ASSESSMENT & PLAN:  Malignant neoplasm of lower-inner quadrant of right breast of female, estrogen receptor positive ([place]) 11/02/2019: Screening mammogram detected right breast distortion 1.8 cm 4 o'clock position with surrounding abnormality together makes it 4.9 cm, no lymph nodes, biopsy revealed invasive lobular cancer grade 2 ER 95%, PR 2%, HER-2 negative, Ki-67 5%  Recommendations: 1. Neoadjuvant antiestrogen therapy with anastrozole 1 mg daily 2. Breast conserving  surgery with sentinel lymph node biopsy followed by 3. Adjuvant radiation therapy followed by 4. Adjuvant antiestrogen therapy ----------------------------------------------------------------------------------------------------------------------------------- Anastrozole toxicities: Denies any hot flashes or myalgias.  Mammogram and ultrasound: 07/06/2020: Marked improvement in the size of the breast cancer. I sent a message to Dr. Rockey Situ to see her to discuss surgical options. If we need to do an MRI then we can order that.  None to clinic after surgery to discuss pathology report.  No orders of the defined types were placed in this encounter.  The patient has a good understanding of the overall plan. she agrees with it. she will call with any problems that may develop before the next visit here.  Total time spent: 30 mins including face to face time and time spent for planning, charting and coordination of care  Nicholas Lose, MD 07/09/2020  I, Cloyde Reams Dorshimer, am acting as scribe for Dr. Nicholas Lose.  I have reviewed the above documentation for accuracy and completeness, and I agree with the above.

## 2020-07-09 ENCOUNTER — Inpatient Hospital Stay: Payer: 59 | Attending: Hematology and Oncology | Admitting: Hematology and Oncology

## 2020-07-09 ENCOUNTER — Other Ambulatory Visit: Payer: Self-pay

## 2020-07-09 DIAGNOSIS — Z170 Estrogen receptor positive status [ER+]: Secondary | ICD-10-CM

## 2020-07-09 DIAGNOSIS — Z79810 Long term (current) use of selective estrogen receptor modulators (SERMs): Secondary | ICD-10-CM | POA: Insufficient documentation

## 2020-07-09 DIAGNOSIS — C50311 Malignant neoplasm of lower-inner quadrant of right female breast: Secondary | ICD-10-CM

## 2020-07-09 NOTE — Assessment & Plan Note (Addendum)
11/02/2019: Screening mammogram detected right breast distortion 1.8 cm 4 o'clock position with surrounding abnormality together makes it 4.9 cm, no lymph nodes, biopsy revealed invasive lobular cancer grade 2 ER 95%, PR 2%, HER-2 negative, Ki-67 5%  Recommendations: 1. Neoadjuvant antiestrogen therapy with anastrozole 1 mg daily 2. Breast conserving surgery with sentinel lymph node biopsy followed by 2. Oncotype DX testing to determine if chemotherapy would be of any benefit followed by 3. Adjuvant radiation therapy followed by 4. Adjuvant antiestrogen therapy ----------------------------------------------------------------------------------------------------------------------------------- Anastrozole toxicities:   Plans to perform mammogram ultrasound in 3 months before surgical planning.

## 2020-07-10 ENCOUNTER — Encounter: Payer: Self-pay | Admitting: *Deleted

## 2020-07-10 ENCOUNTER — Telehealth: Payer: Self-pay | Admitting: Hematology and Oncology

## 2020-07-10 NOTE — Telephone Encounter (Signed)
No 9/20 los, no changes made to pt schedule

## 2020-07-17 ENCOUNTER — Other Ambulatory Visit: Payer: Self-pay | Admitting: Family Medicine

## 2020-07-17 ENCOUNTER — Other Ambulatory Visit (HOSPITAL_COMMUNITY)
Admission: RE | Admit: 2020-07-17 | Discharge: 2020-07-17 | Disposition: A | Discharge status: Home / Self Care | Payer: 59 | Source: Ambulatory Visit | Attending: Family Medicine | Admitting: Family Medicine

## 2020-07-17 DIAGNOSIS — Z124 Encounter for screening for malignant neoplasm of cervix: Secondary | ICD-10-CM | POA: Insufficient documentation

## 2020-07-18 ENCOUNTER — Other Ambulatory Visit: Payer: Self-pay | Admitting: General Surgery

## 2020-07-18 DIAGNOSIS — C50311 Malignant neoplasm of lower-inner quadrant of right female breast: Secondary | ICD-10-CM

## 2020-07-19 ENCOUNTER — Encounter: Payer: Self-pay | Admitting: *Deleted

## 2020-07-19 LAB — CYTOLOGY - PAP
Comment: NEGATIVE
Diagnosis: NEGATIVE
High risk HPV: NEGATIVE

## 2020-07-20 ENCOUNTER — Encounter: Payer: Self-pay | Admitting: *Deleted

## 2020-07-21 ENCOUNTER — Other Ambulatory Visit: Payer: 59

## 2020-08-03 ENCOUNTER — Ambulatory Visit
Admission: RE | Admit: 2020-08-03 | Discharge: 2020-08-03 | Disposition: A | Discharge status: Home / Self Care | Payer: 59 | Source: Ambulatory Visit | Attending: General Surgery | Admitting: General Surgery

## 2020-08-03 DIAGNOSIS — Z170 Estrogen receptor positive status [ER+]: Secondary | ICD-10-CM

## 2020-08-03 DIAGNOSIS — C50311 Malignant neoplasm of lower-inner quadrant of right female breast: Secondary | ICD-10-CM

## 2020-08-03 IMAGING — MR MR BREAST BILAT WO/W CM
8 of 13 series · 32 of 48 positions shown · IV contrast (gadavist)
Comparison: Previous examinations, including right diagnostic
mammogram and right breast ultrasound dated [DATE], bilateral
breast MRI dated [DATE], MR guided right breast biopsy dated
[DATE] ultrasound-guided right breast biopsy dated [DATE],
bilateral diagnostic mammogram and right breast ultrasound dated
[DATE] and bilateral screening mammogram dated [DATE].

CLINICAL DATA: Follow-up biopsy-proven invasive lobular carcinoma
involving the entire lower inner quadrant and portions of the lower
outer quadrant of the right breast, 1st diagnosed on [DATE]

LABS:  None obtained on site today.
EXAM:
BILATERAL BREAST MRI WITH AND WITHOUT CONTRAST
TECHNIQUE: Multiplanar, multisequence MR images of both breasts were obtained
prior to and following the intravenous administration of 7 ml of
Gadavist

[Series 2: t2_tirm_tra ipat (a-p) · axial · 3.0mm · 0.70mm/px · 1 of 60 slices shown]
[im 1/60]
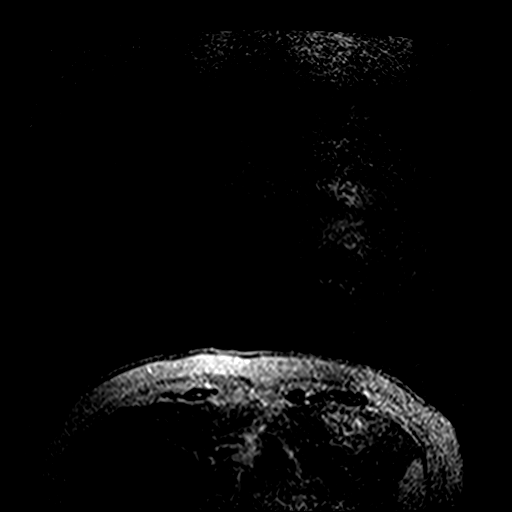

[Series 3: fl3d pre-cm no · axial · non-contrast · 1.2mm · 0.94mm/px · z∈[-82,+109]mm · 5 of 160 slices shown]
[im 1/160]
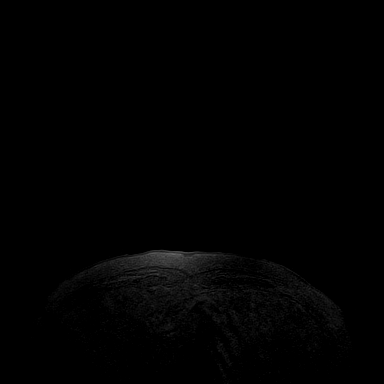
[im 40/160]
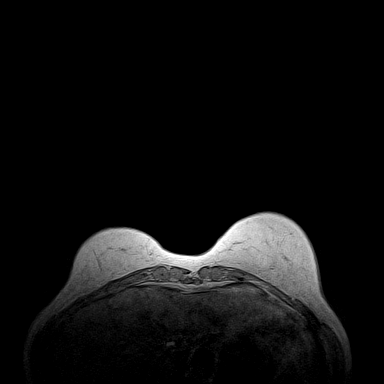
[im 80/160]
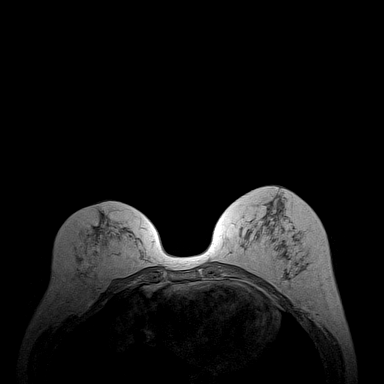
[im 120/160]
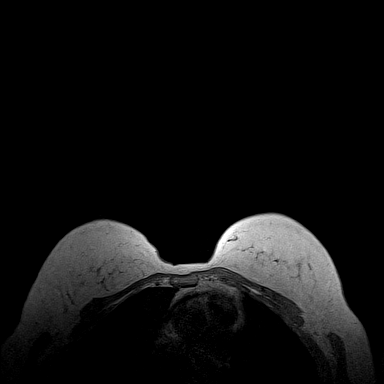
[im 160/160]
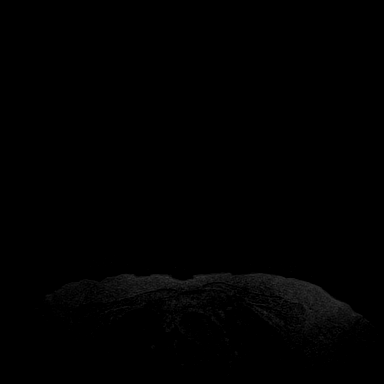

[Series 4: fl3d pre-cm · axial · non-contrast · 1.2mm · 0.94mm/px · z∈[-82,+109]mm · 5 of 160 slices shown]
[im 1/160]
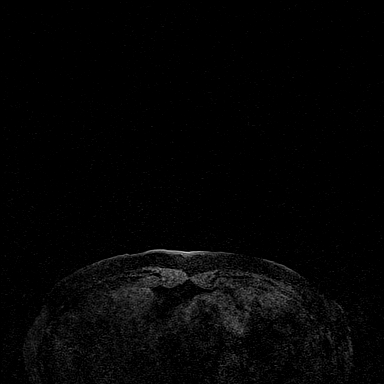
[im 40/160]
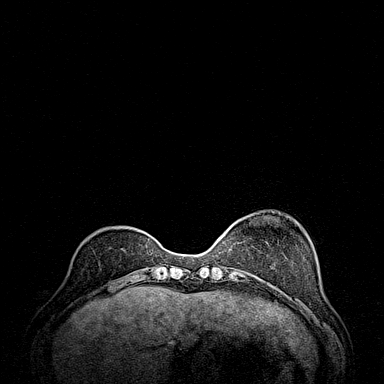
[im 80/160]
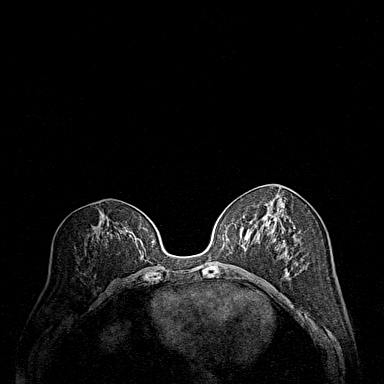
[im 120/160]
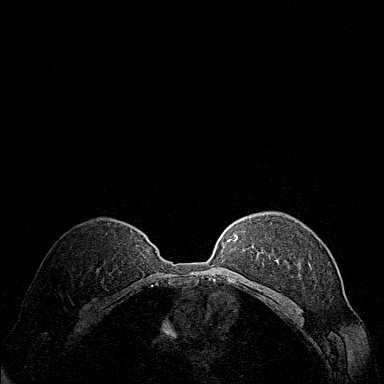
[im 160/160]
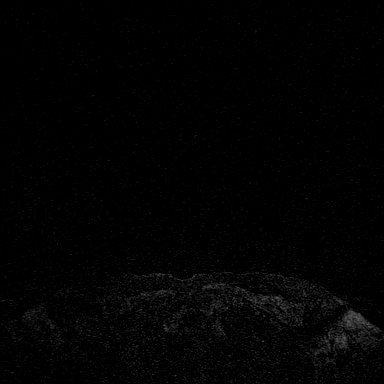

[Series 5: fl3d post-cm 20 · axial · 1.2mm · 0.94mm/px · z∈[-82,+109]mm · 5 of 160 slices shown (1 of 3)]
[im 1/160]
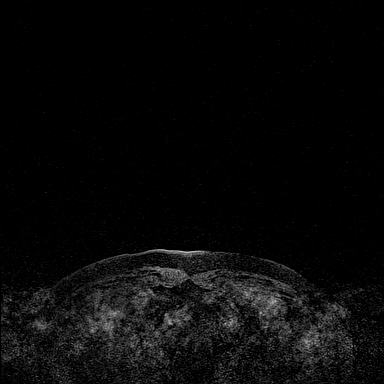
[im 40/160]
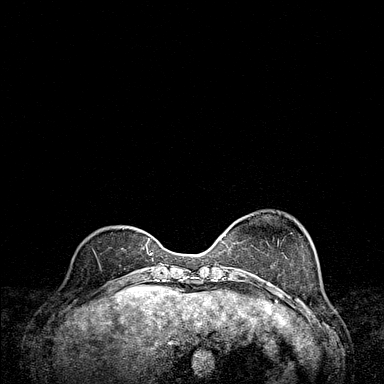
[im 80/160]
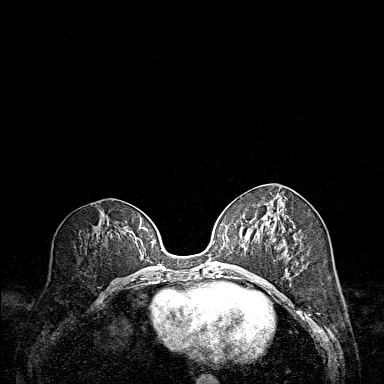
[im 120/160]
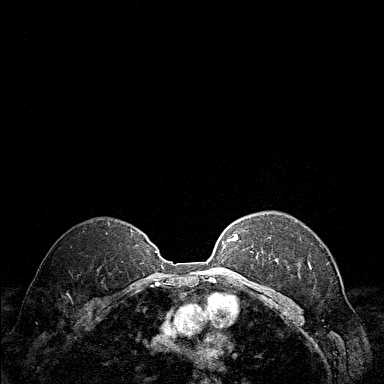
[im 160/160]
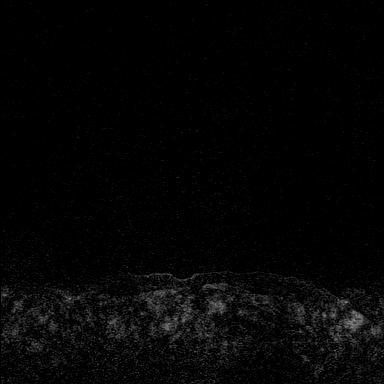

[Series 6: fl3d post-cm 20 · axial · 1.2mm · 0.94mm/px · z∈[-82,+109]mm · 5 of 160 slices shown (2 of 3)]
[im 1/160]
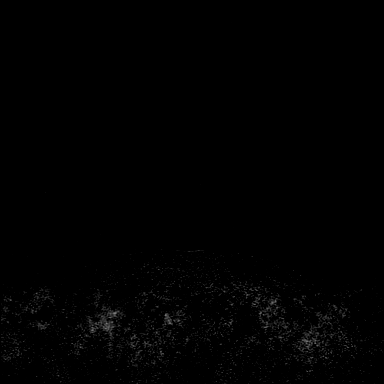
[im 40/160]
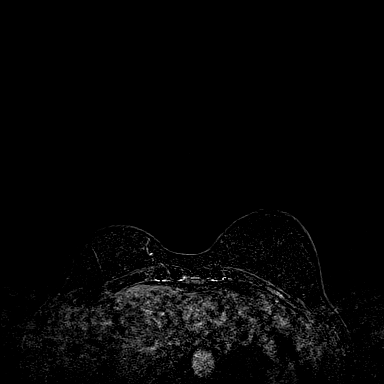
[im 80/160]
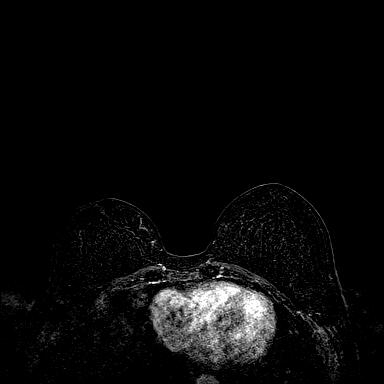
[im 120/160]
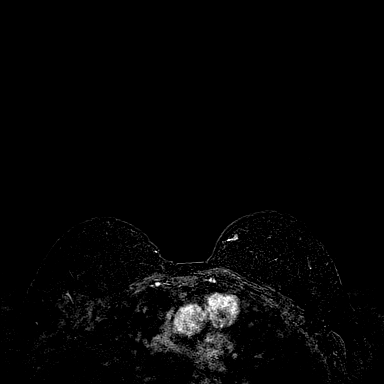
[im 160/160]
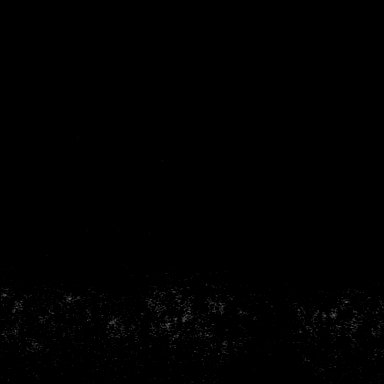

[Series 7: fl3d post-cm 20 · axial · 192.0mm · 0.94mm/px · 1 of 1 slices shown (3 of 3)]
[im 1/1]
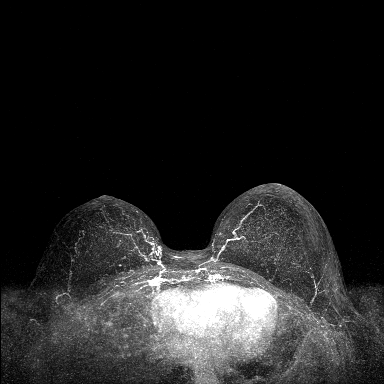

[Series 8: fl3d post-cm 3min · axial · 1.2mm · 0.94mm/px · z∈[-82,+109]mm · 5 of 160 slices shown]
[im 1/160]
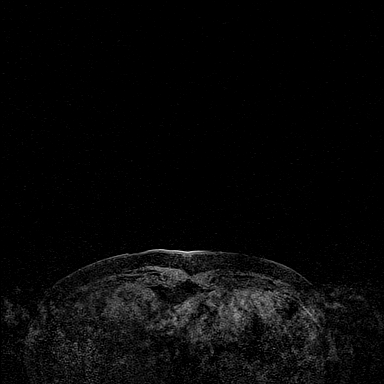
[im 40/160]
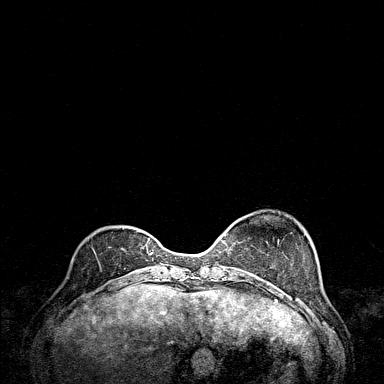
[im 80/160]
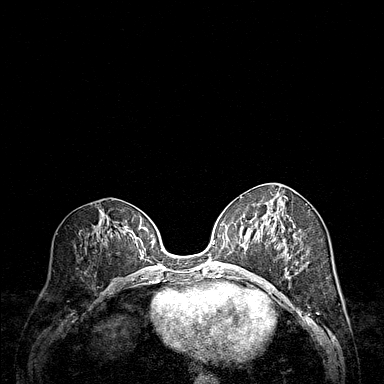
[im 120/160]
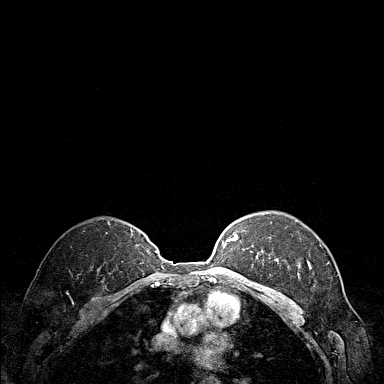
[im 160/160]
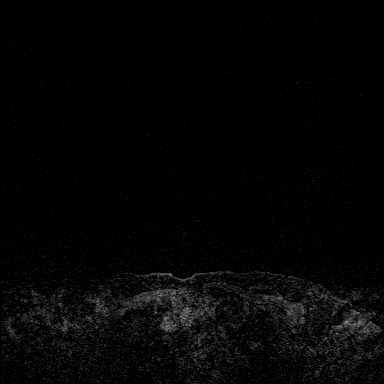

[Series 9: fl3d post-cm 3min_sub · axial · 1.2mm · 0.94mm/px · z∈[-82,+70]mm · 5 of 160 slices shown]
[im 1/160]
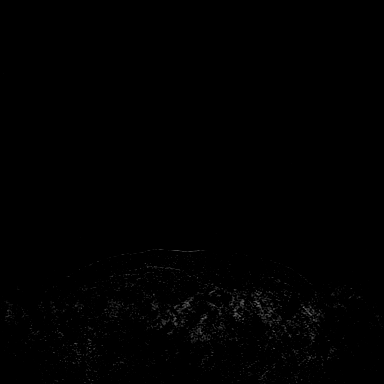
[im 32/160]
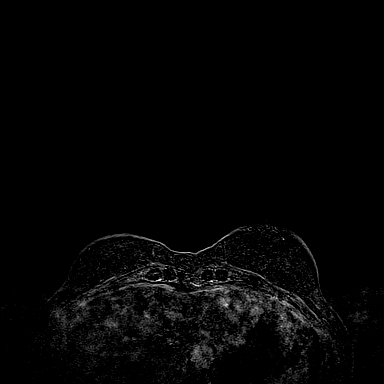
[im 64/160]
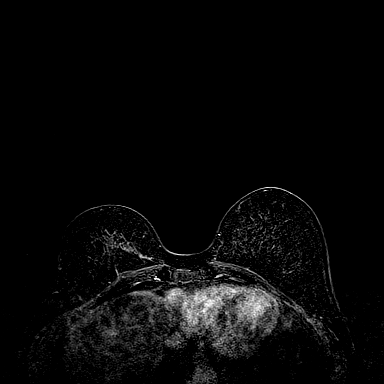
[im 96/160]
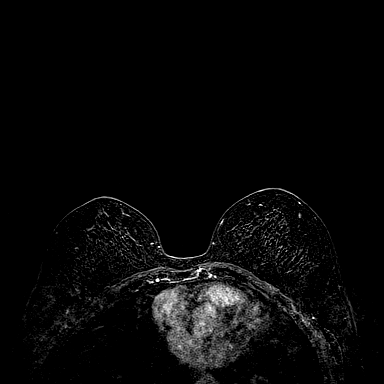
[im 128/160]
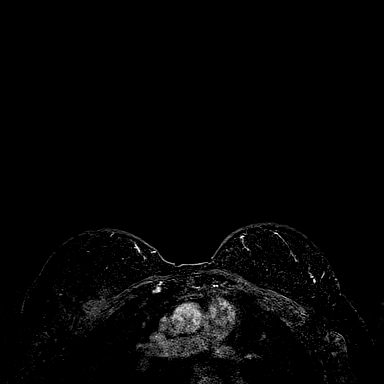

[32 of 48 positions shown; findings below may reference images not displayed]

Three-dimensional MR images were rendered by post-processing of the
original MR data on an independent workstation. The
three-dimensional MR images were interpreted, and findings are
reported in the following complete MRI report for this study. Three
dimensional images were evaluated at the independent interpreting
workstation using the DynaCAD thin client.
FINDINGS: Breast composition: c. Heterogeneous fibroglandular tissue.

Background parenchymal enhancement: Mild.

Right breast: A post biopsy the clip artifact is again demonstrated
in the central breast in the lower inner quadrant. The previously
demonstrated associated enhancing mass is no longer visualized.
There is mild residual patchy non mass enhancement in the lower
inner quadrant and extending into the lower outer quadrant. This
area measures 5.4 x 2.3 cm on image number 94 series 12 and 1.0 cm
in cephalo caudal dimension. This has low grade enhancement with
persistent kinetics.

Left breast: No mass or abnormal enhancement.

Lymph nodes: No abnormal appearing lymph nodes.

Ancillary findings:  None.
IMPRESSION: 1. Mild residual patchy non mass enhancement in the lower inner
quadrant of the right breast and extending into the lower outer
quadrant at the location of the previously demonstrated 6.2 x 5.5 x
4.5 cm mass. This area today measures 5.4 x 2.3 x 1.0 cm. This
represents a positive response to anastrozole.
2. No evidence of malignancy elsewhere in either breast and no
adenopathy.

RECOMMENDATION:
Treatment plan.

BI-RADS CATEGORY  6: Known biopsy-proven malignancy.

## 2020-08-03 MED ORDER — GADOBUTROL 1 MMOL/ML IV SOLN
7.0000 mL | Freq: Once | INTRAVENOUS | Status: AC | PRN
  Administered 2020-08-03: 7 mL via INTRAVENOUS

## 2020-08-06 ENCOUNTER — Encounter: Payer: Self-pay | Admitting: *Deleted

## 2020-08-09 ENCOUNTER — Other Ambulatory Visit: Payer: Self-pay

## 2020-08-09 ENCOUNTER — Ambulatory Visit (INDEPENDENT_AMBULATORY_CARE_PROVIDER_SITE_OTHER): Payer: 59

## 2020-08-09 ENCOUNTER — Ambulatory Visit (INDEPENDENT_AMBULATORY_CARE_PROVIDER_SITE_OTHER): Payer: 59 | Admitting: Podiatry

## 2020-08-09 DIAGNOSIS — M25371 Other instability, right ankle: Secondary | ICD-10-CM

## 2020-08-09 DIAGNOSIS — Q667 Congenital pes cavus, unspecified foot: Secondary | ICD-10-CM

## 2020-08-09 DIAGNOSIS — R52 Pain, unspecified: Secondary | ICD-10-CM

## 2020-08-10 ENCOUNTER — Telehealth: Payer: Self-pay | Admitting: Podiatry

## 2020-08-10 NOTE — Telephone Encounter (Signed)
Pt was seen yesterday and was given a brace and it is too big for her. It keeps wrinkling up on her foot. She wears a size 6.5 and was given a med brace that said size 8 to 11 on it. She will stop in next week to pick up the small brace.

## 2020-08-13 ENCOUNTER — Encounter: Payer: Self-pay | Admitting: *Deleted

## 2020-08-13 NOTE — Telephone Encounter (Signed)
That is fine have her fitter for smaller brace. Thank you

## 2020-08-14 ENCOUNTER — Encounter: Payer: Self-pay | Admitting: Podiatry
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
## 2020-08-16 ENCOUNTER — Other Ambulatory Visit: Payer: Self-pay | Admitting: *Deleted

## 2020-08-23 ENCOUNTER — Encounter: Payer: Self-pay | Admitting: *Deleted

## 2020-08-28 ENCOUNTER — Telehealth: Payer: Self-pay | Admitting: Podiatry

## 2020-08-28 NOTE — Telephone Encounter (Signed)
Pt left message asking if we had gotten any of the small bioskin ankle braces in yet.  I returned call and left message for pt that we did have the bioskin small ankle braces in and she could stop in and one of the assistants could help her with it.

## 2020-08-30 ENCOUNTER — Ambulatory Visit: Payer: 59 | Admitting: Hematology and Oncology

## 2020-09-04 ENCOUNTER — Telehealth: Payer: Self-pay | Admitting: Hematology and Oncology

## 2020-09-04 ENCOUNTER — Encounter: Payer: Self-pay | Admitting: *Deleted

## 2020-09-04 NOTE — Telephone Encounter (Signed)
Scheduled appt per 11/16 sch msg - left message for patient with appt date and time  ° °

## 2020-09-17 ENCOUNTER — Encounter: Payer: Self-pay | Admitting: *Deleted

## 2020-09-21 ENCOUNTER — Ambulatory Visit: Payer: 59 | Admitting: Podiatry

## 2020-10-03 NOTE — Assessment & Plan Note (Signed)
11/02/2019: Screening mammogram detected right breast distortion 1.8 cm 4 o'clock position with surrounding abnormality together makes it 4.9 cm, no lymph nodes, biopsy revealed invasive lobular cancer grade 2 ER 95%, PR 2%, HER-2 negative, Ki-67 5%  Recommendations: 1. Neoadjuvant antiestrogen therapy with anastrozole 1 mg daily 2. Breast conserving surgerywith sentinel lymph node biopsyfollowed by 3. Adjuvant radiation therapy followed by 4. Adjuvant antiestrogen therapy ----------------------------------------------------------------------------------------------------------------------------------- Anastrozole toxicities: Denies any hot flashes or myalgias.  Mammogram and ultrasound: 07/06/2020: Marked improvement in the size of the breast cancer. I sent a message to Dr. Rockey Situ to see her to discuss surgical options. Breast MRI 08/03/20: residual NME 5.4 cm (was 6.2 cm)

## 2020-10-03 NOTE — Progress Notes (Signed)
Patient Care Team: Kristen Loader, FNP as PCP - General (Family Medicine) Mauro Kaufmann, RN as Oncology Nurse Navigator Rockwell Germany, RN as Oncology Nurse Navigator  DIAGNOSIS:    ICD-10-CM   1. Malignant neoplasm of lower-inner quadrant of right breast of female, estrogen receptor positive ([place])  C50.311    Z17.0     SUMMARY OF ONCOLOGIC HISTORY: Oncology History  Malignant neoplasm of lower-inner quadrant of right breast of female, estrogen receptor positive ([place])  10/27/2019 Cancer Staging   Staging form: Breast, AJCC 8th Edition - Clinical stage from 10/27/2019: Stage IA (cT1c, cN0, cM0, G2, ER+, PR+, HER2-) - Signed by Gardenia Phlegm, NP on 11/02/2019   11/02/2019 Initial Diagnosis   Screening mammogram detected a right breast distortion. Diagnostic mammogram showed a 1.8cm right breast mass, 4:00 position, with abnormal tissue surrounding the mass measuring a total of 4.9cm, no right axillary adenopathy. Biopsy showed invasive mammary carcinoma with lobular features, grade 2, HER-2 - (1+), ER+ 95%, PR+ 2%, KI67 5%.      CHIEF COMPLIANT: Follow-up of right breast cancer on neoadjuvant anastrozole  INTERVAL HISTORY: Sydney Moody is a 64 y.o. with above-mentioned history of right breast cancer currently on neoadjuvant antiestrogen therapy with anastrozole. Breast MRI on 08/03/20 showed improvement in the non mass enhancement in the low inner right breast measuring 5.4cm, previously 6.2cm. She presents to the clinic today for follow-up.   ALLERGIES:  is allergic to iodine.  MEDICATIONS:  Current Outpatient Medications  Medication Sig Dispense Refill  . ALPRAZolam (XANAX) 0.5 MG tablet Take 0.5 mg by mouth 2 (two) times daily as needed for anxiety.    Marland Kitchen anastrozole (ARIMIDEX) 1 MG tablet TAKE ONE TABLET BY MOUTH DAILY 30 tablet 0  . B Complex Vitamins (B COMPLEX 1 PO) Take by mouth.    . cyclobenzaprine (FLEXERIL) 10 MG tablet Take 10 mg by mouth once.    .  diphenhydrAMINE HCl (BENADRYL ALLERGY PO) Take by mouth.    . Ferrous Gluconate-C-Folic Acid (IRON-C PO) Take by mouth.    . fluticasone (FLONASE) 50 MCG/ACT nasal spray Place 1 spray into both nostrils daily.    . Homeopathic Products (ZICAM COLD REMEDY PO) Take by mouth.    Marland Kitchen ibuprofen (ADVIL,MOTRIN) 200 MG tablet Take 200 mg by mouth every 6 (six) hours as needed.    . Levothyroxine Sodium (LEVOTHROID PO) Take 50 mcg by mouth.    . metroNIDAZOLE (METROCREAM) 0.75 % cream Apply 1 application topically 2 (two) times daily.    . NON FORMULARY     . NON FORMULARY Apply 1 application topically.    . Omega-3 1000 MG CAPS Take by mouth.     No current facility-administered medications for this visit.    PHYSICAL EXAMINATION: ECOG PERFORMANCE STATUS: 1 - Symptomatic but completely ambulatory  Vitals:   10/04/20 1149  BP: 120/66  Pulse: 80  Resp: 17  Temp: 97.9 F (36.6 C)  SpO2: 100%   Filed Weights   10/04/20 1149  Weight: 150 lb 4.8 oz (68.2 kg)      LABORATORY DATA:  I have reviewed the data as listed CMP Latest Ref Rng & Units 04/24/2009  Glucose 70 - 99 mg/dL 85  BUN 6 - 23 mg/dL 16  Creatinine 0.4 - 1.2 mg/dL 1.0  Sodium 135 - 145 meq/L 144  Potassium 3.5 - 5.1 meq/L 4.6  Chloride 96 - 112 meq/L 106  CO2 19 - 32 meq/L 31  Calcium 8.4 -  10.5 mg/dL 9.4    Lab Results  Component Value Date   WBC 6.1 05/14/2009   HGB 13.4 05/14/2009   HCT 38.7 05/14/2009   MCV 93.0 05/14/2009   PLT 217.0 05/14/2009   NEUTROABS 4.2 05/14/2009    ASSESSMENT & PLAN:  Malignant neoplasm of lower-inner quadrant of right breast of female, estrogen receptor positive ([place]) 11/02/2019: Screening mammogram detected right breast distortion 1.8 cm 4 o'clock position with surrounding abnormality together makes it 4.9 cm, no lymph nodes, biopsy revealed invasive lobular cancer grade 2 ER 95%, PR 2%, HER-2 negative, Ki-67 5%  Recommendations: 1. Neoadjuvant antiestrogen therapy with  anastrozole 1 mg daily 2. Breast conserving surgerywith sentinel lymph node biopsyfollowed by 3. Adjuvant radiation therapy followed by 4. Adjuvant antiestrogen therapy ----------------------------------------------------------------------------------------------------------------------------------- Anastrozole toxicities: Denies any hot flashes or myalgias.  Mammogram and ultrasound: 07/06/2020: Marked improvement in the size of the breast cancer. I sent a message to Dr. Rockey Situ to see her to discuss surgical options. Breast MRI 08/03/20: residual NME 5.4 cm (was 6.2 cm)  Our plan is to continue neoadjuvant antiestrogen therapy until January 02, 2020. I ordered a breast MRI after that and I sent a message to Dr. Marlou Starks to see her after that for surgical options.  She is contemplating risks and benefits of doing bilateral mastectomies if the tumor does not shrink enough and would require unilateral mastectomy.  Return to clinic after surgery to discuss pathology report. No orders of the defined types were placed in this encounter.  The patient has a good understanding of the overall plan. she agrees with it. she will call with any problems that may develop before the next visit here.  Total time spent: 20 mins including face to face time and time spent for planning, charting and coordination of care  Nicholas Lose, MD 10/04/2020  I, Cloyde Reams Dorshimer, am acting as scribe for Dr. Nicholas Lose.  I have reviewed the above documentation for accuracy and completeness, and I agree with the above.

## 2020-10-04 ENCOUNTER — Other Ambulatory Visit: Payer: Self-pay

## 2020-10-04 ENCOUNTER — Encounter: Payer: Self-pay | Admitting: *Deleted

## 2020-10-04 ENCOUNTER — Inpatient Hospital Stay: Payer: 59 | Attending: Hematology and Oncology | Admitting: Hematology and Oncology

## 2020-10-04 DIAGNOSIS — Z170 Estrogen receptor positive status [ER+]: Secondary | ICD-10-CM

## 2020-10-04 DIAGNOSIS — C50311 Malignant neoplasm of lower-inner quadrant of right female breast: Secondary | ICD-10-CM | POA: Diagnosis not present

## 2020-10-04 DIAGNOSIS — Z79810 Long term (current) use of selective estrogen receptor modulators (SERMs): Secondary | ICD-10-CM | POA: Insufficient documentation

## 2020-10-04 MED ORDER — ANASTROZOLE 1 MG PO TABS: 1.0000 mg | ORAL_TABLET | Freq: Every day | ORAL | 3 refills | Status: DC

## 2020-10-09 ENCOUNTER — Telehealth: Payer: Self-pay | Admitting: Hematology and Oncology

## 2020-10-09 NOTE — Telephone Encounter (Signed)
Per 12/16 los, no new changes made to pt schedule

## 2020-12-14 ENCOUNTER — Encounter: Payer: Self-pay | Admitting: *Deleted

## 2020-12-31 ENCOUNTER — Other Ambulatory Visit: Payer: Self-pay

## 2020-12-31 DIAGNOSIS — C50311 Malignant neoplasm of lower-inner quadrant of right female breast: Secondary | ICD-10-CM

## 2020-12-31 DIAGNOSIS — Z170 Estrogen receptor positive status [ER+]: Secondary | ICD-10-CM

## 2021-01-03 ENCOUNTER — Other Ambulatory Visit: Payer: 59

## 2021-01-03 ENCOUNTER — Encounter: Payer: Self-pay | Admitting: *Deleted

## 2021-01-11 ENCOUNTER — Ambulatory Visit (HOSPITAL_COMMUNITY): Payer: 59

## 2021-01-14 ENCOUNTER — Encounter: Payer: Self-pay | Admitting: *Deleted

## 2021-01-14 ENCOUNTER — Ambulatory Visit: Payer: 59 | Admitting: Hematology and Oncology

## 2021-02-06 ENCOUNTER — Encounter: Payer: Self-pay | Admitting: *Deleted

## 2021-02-06 ENCOUNTER — Ambulatory Visit (HOSPITAL_COMMUNITY)
Admission: RE | Admit: 2021-02-06 | Discharge: 2021-02-06 | Disposition: A | Discharge status: Home / Self Care | Payer: 59 | Source: Ambulatory Visit | Attending: Hematology and Oncology | Admitting: Hematology and Oncology

## 2021-02-06 ENCOUNTER — Other Ambulatory Visit: Payer: Self-pay

## 2021-02-06 DIAGNOSIS — Z170 Estrogen receptor positive status [ER+]: Secondary | ICD-10-CM | POA: Insufficient documentation

## 2021-02-06 DIAGNOSIS — C50311 Malignant neoplasm of lower-inner quadrant of right female breast: Secondary | ICD-10-CM | POA: Diagnosis present

## 2021-02-06 IMAGING — MR MR BREAST BILAT WO/W CM
8 of 11 series · 29 of 48 positions shown · IV contrast (7ML GADAVIST)
Comparison: [DATE] and [DATE] breast MRs. Prior mammograms

CLINICAL DATA: 64-year-old female with biopsy-proven invasive
lobular carcinoma within the LOWER RIGHT breast 1st diagnosed on
[DATE] - evaluate anastrozole response.

LABS:  None performed today
EXAM:
BILATERAL BREAST MRI WITH AND WITHOUT CONTRAST
TECHNIQUE: Multiplanar, multisequence MR images of both breasts were obtained
prior to and following the intravenous administration of 7 ml of
Gadavist

[Series 2: T2 · axial · 3.0mm · 0.89mm/px · z∈[-82,+101]mm · 4 of 62 slices shown]
[im 1/62]
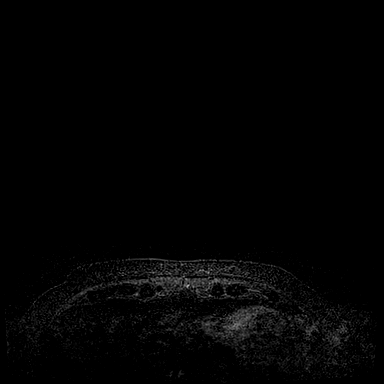
[im 21/62]
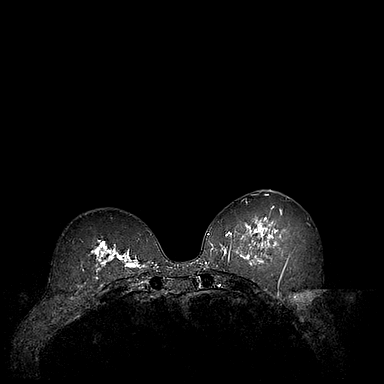
[im 41/62]
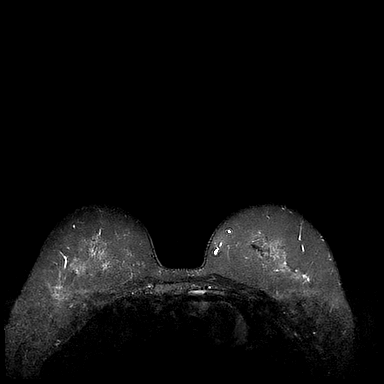
[im 62/62]
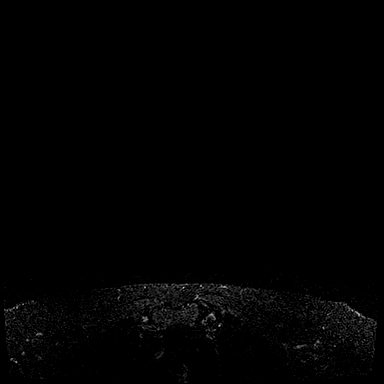

[Series 3: T1 fat-sat · axial · 1.2mm · 0.71mm/px · z∈[-76,+95]mm · 7 of 144 slices shown (1 of 4)]
[im 1/144]
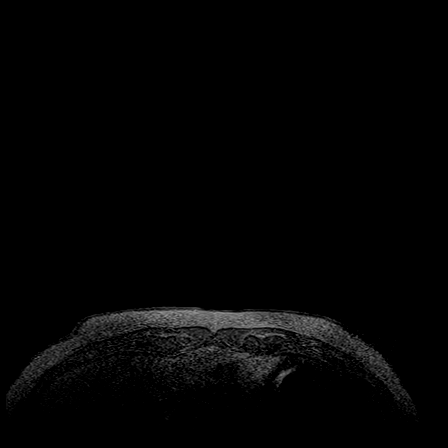
[im 24/144]
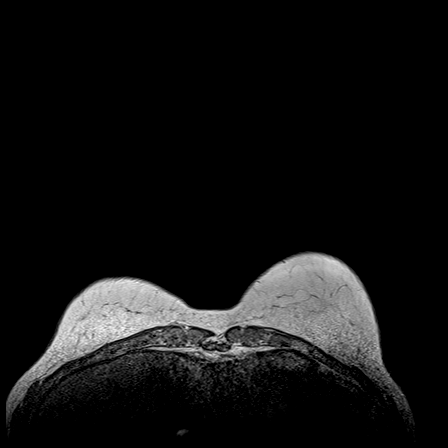
[im 48/144]
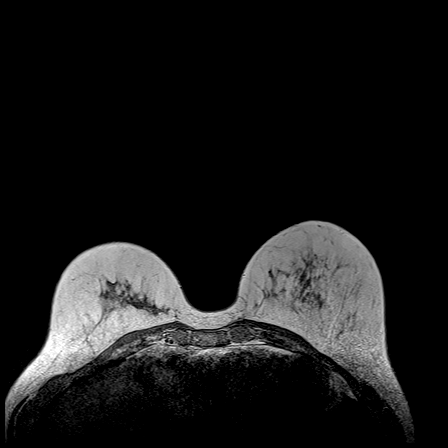
[im 72/144]
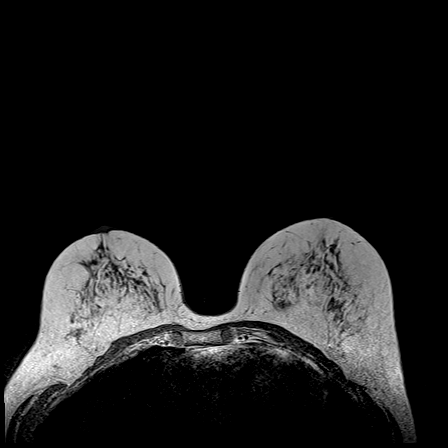
[im 96/144]
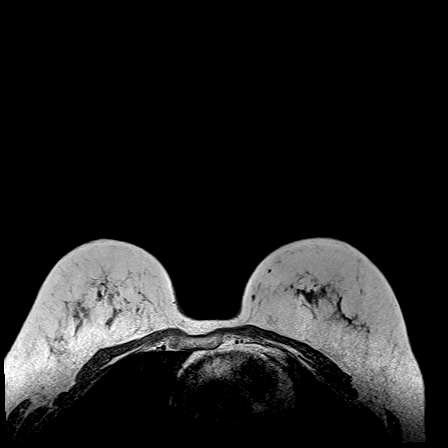
[im 120/144]
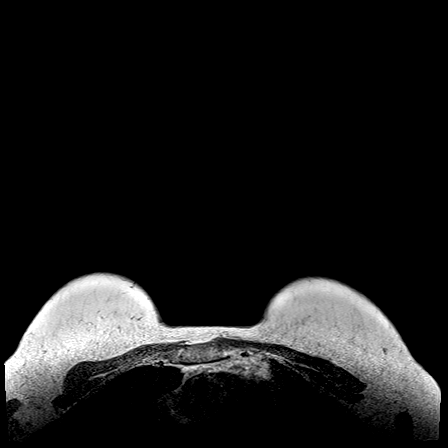
[im 144/144]
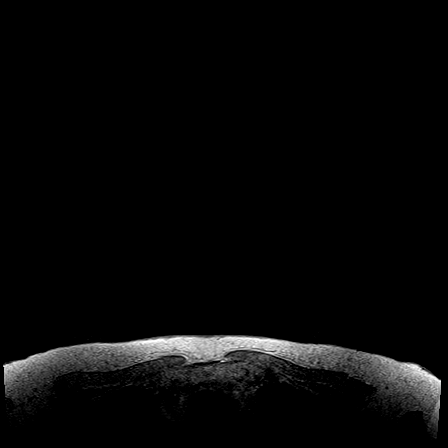

[Series 5: T1 fat-sat · axial · 1.6mm · 0.87mm/px · z∈[-88,+102]mm · 5 of 120 slices shown (2 of 4)]
[im 1/120]
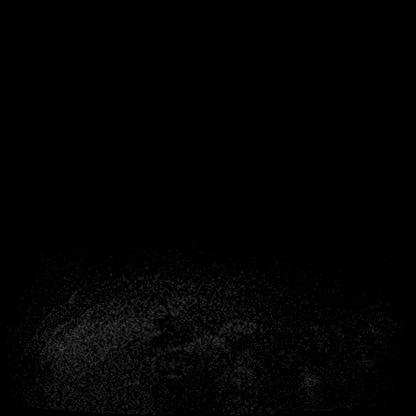
[im 30/120]
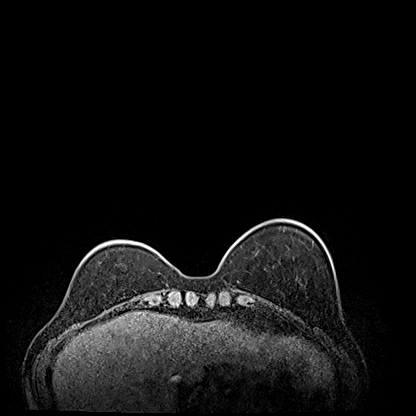
[im 60/120]
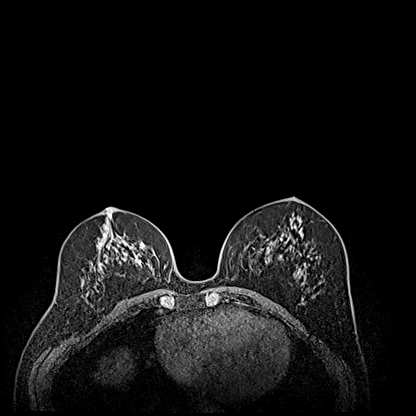
[im 90/120]
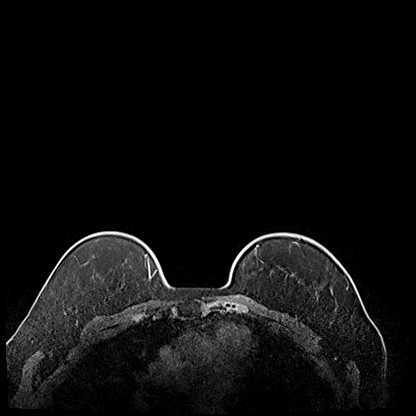
[im 120/120]
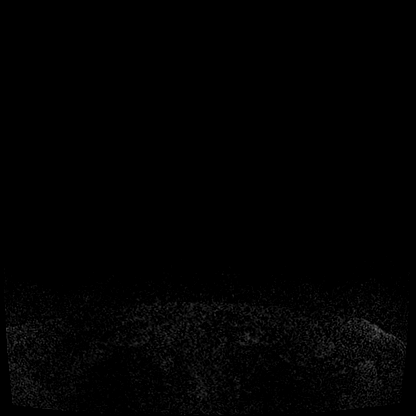

[Series 6: T1 fat-sat · axial · 1.6mm · 0.87mm/px · z∈[-88,+102]mm · 5 of 120 slices shown (3 of 4)]
[im 1/120]
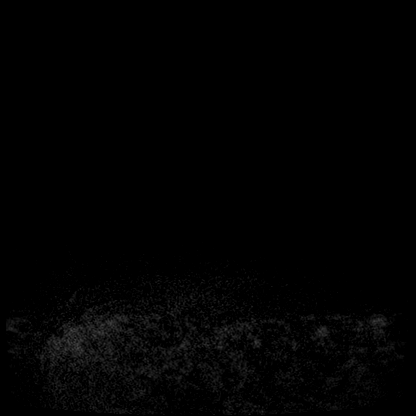
[im 30/120]
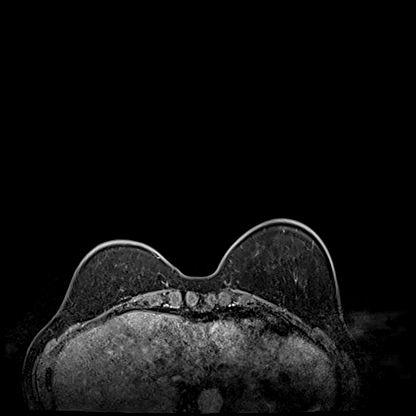
[im 60/120]
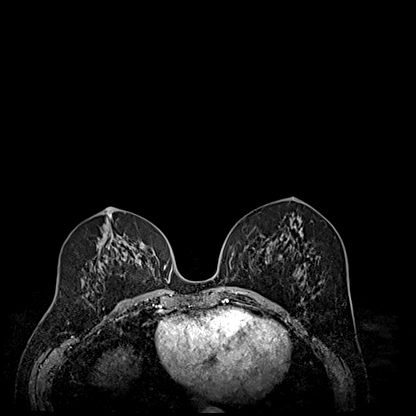
[im 90/120]
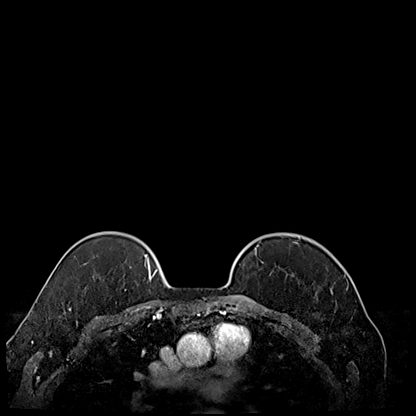
[im 120/120]
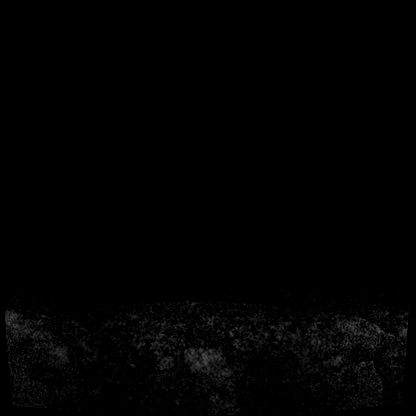

[Series 7: T1 · axial · 1.6mm · 0.87mm/px · z∈[-88,+102]mm · 5 of 120 slices shown (1 of 3)]
[im 1/120]
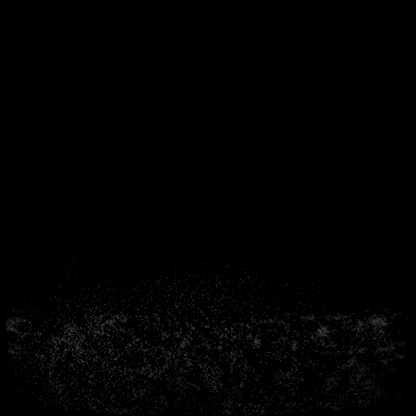
[im 30/120]
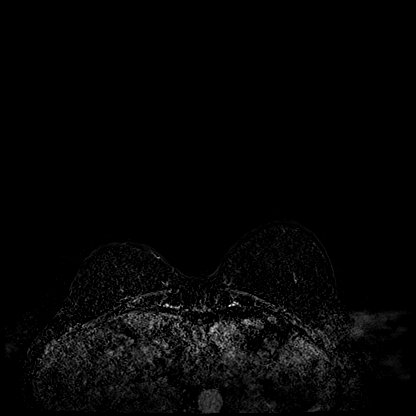
[im 60/120]
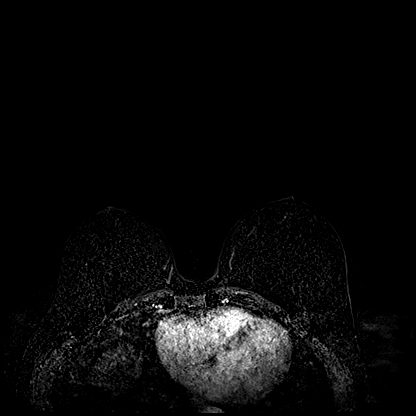
[im 90/120]
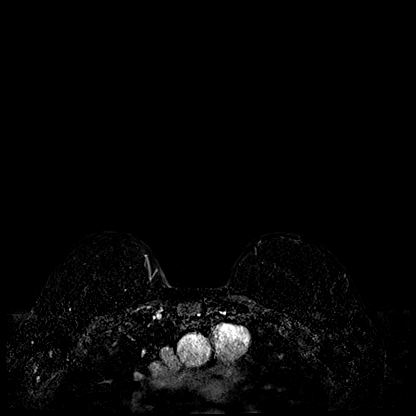
[im 120/120]
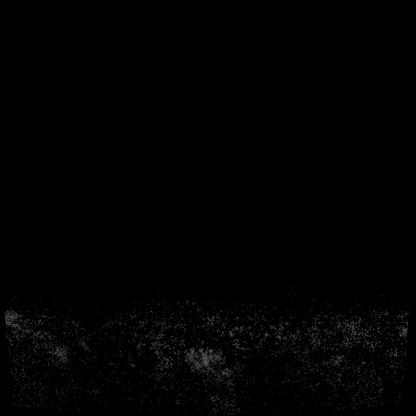

[Series 8: T1 · coronal · 360.0mm · 0.87mm/px · 1 of 3 slices shown (2 of 3)]
[im 1/3]
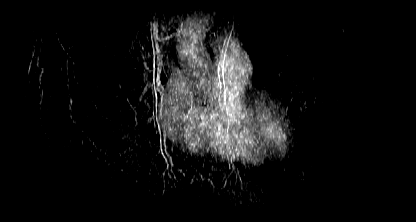

[Series 9: T1 · axial · 192.0mm · 0.87mm/px · 1 of 3 slices shown (3 of 3)]
[im 1/3]
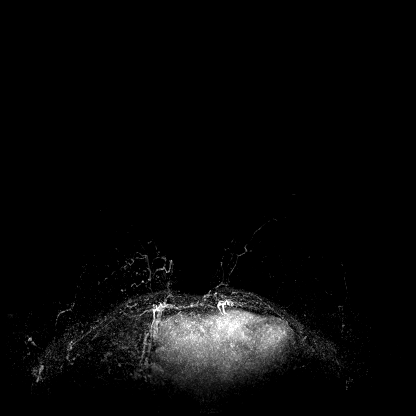

[Series 10: T1 fat-sat · axial · 1.6mm · 0.87mm/px · 1 of 120 slices shown (4 of 4)]
[im 1/120]
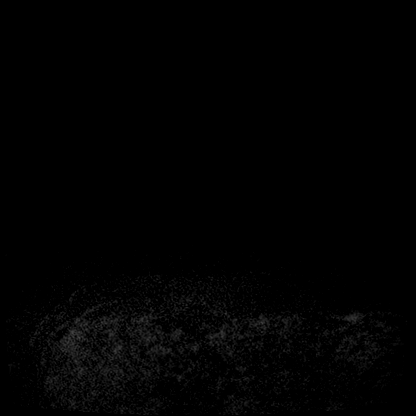

[29 of 48 positions shown; findings below may reference images not displayed]

Three-dimensional MR images were rendered by post-processing of the
original MR data on an independent workstation. The
three-dimensional MR images were interpreted, and findings are
reported in the following complete MRI report for this study. Three
dimensional images were evaluated at the independent interpreting
workstation using the DynaCAD thin client.
FINDINGS: Breast composition: b. Scattered fibroglandular tissue.

Background parenchymal enhancement: Minimal

Right breast: Biopsy clip artifacts within the LOWER RIGHT breast
again identified. Minimal non masslike enhancement within the LOWER
RIGHT breast is again noted measuring 5.4 x 2.3 x 1 cm. This is
unchanged from [DATE] but significantly improved since
[DATE].

No new mass or new areas of suspicious enhancement noted.

Left breast: No mass or abnormal enhancement.

Lymph nodes: No abnormal appearing lymph nodes.

Ancillary findings:  None.
IMPRESSION: 1. Minimal non masslike enhancement within the LOWER RIGHT breast -
stable from [DATE] but significantly improved since [DATE],
signifying near complete treatment response since [DATE].
[DATE]. No new suspicious RIGHT breast findings. No evidence of LEFT
breast malignancy. No abnormal lymph nodes.

RECOMMENDATION:
Treatment plan

BI-RADS CATEGORY  6: Known biopsy-proven malignancy.

## 2021-02-06 MED ORDER — GADOBUTROL 1 MMOL/ML IV SOLN
7.0000 mL | Freq: Once | INTRAVENOUS | Status: AC | PRN
  Administered 2021-02-06: 7 mL via INTRAVENOUS

## 2021-02-11 ENCOUNTER — Encounter: Payer: Self-pay | Admitting: *Deleted

## 2021-02-13 ENCOUNTER — Ambulatory Visit: Payer: Self-pay | Admitting: General Surgery

## 2021-02-13 DIAGNOSIS — C50311 Malignant neoplasm of lower-inner quadrant of right female breast: Secondary | ICD-10-CM

## 2021-02-13 DIAGNOSIS — Z170 Estrogen receptor positive status [ER+]: Secondary | ICD-10-CM

## 2021-02-19 ENCOUNTER — Encounter: Payer: Self-pay | Admitting: *Deleted

## 2021-02-20 ENCOUNTER — Telehealth: Payer: Self-pay | Admitting: Hematology and Oncology

## 2021-02-20 NOTE — Telephone Encounter (Signed)
Scheduled appt per 5/3 sch msg. Pt aware.  

## 2021-02-26 ENCOUNTER — Encounter: Payer: Self-pay | Admitting: *Deleted

## 2021-04-15 NOTE — Pre-Procedure Instructions (Signed)
Surgical Instructions    Your procedure is scheduled on Thursday, July 7th.  Report to [facility] Main Entrance "A" at 6:30 A.M., then check in with the Admitting office.  Call this number if you have problems the morning of surgery:  [phone]   If you have any questions prior to your surgery date call [phone]: Open Monday-Friday 8am-4pm    Remember:  Do not eat after midnight the night before your surgery  You may drink clear liquids until 5:30 the morning of your surgery.   Clear liquids allowed are: Water, Non-Citrus Juices (without pulp), Carbonated Beverages, Clear Tea, Black Coffee Only, and Gatorade.    Take these medicines the morning of surgery with A SIP OF WATER  anastrozole (ARIMIDEX)  Levothyroxine Sodium (LEVOTHROID PO) fluticasone (FLONASE) ALPRAZolam (XANAX)-as needed  As of today, STOP taking any Aspirin (unless otherwise instructed by your surgeon) Aleve, Naproxen, Ibuprofen, Motrin, Advil, Goody's, BC's, all herbal medications, fish oil, and all vitamins.                     Do NOT Smoke (Tobacco/Vaping) or drink Alcohol 24 hours prior to your procedure.  If you use a CPAP at night, you may bring all equipment for your overnight stay.   Contacts, glasses, piercing's, hearing aid's, dentures or partials may not be worn into surgery, please bring cases for these belongings.    For patients admitted to the hospital, discharge time will be determined by your treatment team.   Patients discharged the day of surgery will not be allowed to drive home, and someone needs to stay with them for 24 hours.  ONLY 1 SUPPORT PERSON MAY BE PRESENT WHILE YOU ARE IN SURGERY. IF YOU ARE TO BE ADMITTED ONCE YOU ARE IN YOUR ROOM YOU WILL BE ALLOWED TWO (2) VISITORS.  Minor children may have two parents present. Special consideration for safety and communication needs will be reviewed on a case by case basis.   Special instructions:   [facility]- Preparing For  Surgery  Before surgery, you can play an important role. Because skin is not sterile, your skin needs to be as free of germs as possible. You can reduce the number of germs on your skin by washing with CHG (chlorahexidine gluconate) Soap before surgery.  CHG is an antiseptic cleaner which kills germs and bonds with the skin to continue killing germs even after washing.    Oral Hygiene is also important to reduce your risk of infection.  Remember - BRUSH YOUR TEETH THE MORNING OF SURGERY WITH YOUR REGULAR TOOTHPASTE  Please do not use if you have an allergy to CHG or antibacterial soaps. If your skin becomes reddened/irritated stop using the CHG.  Do not shave (including legs and underarms) for at least 48 hours prior to first CHG shower. It is OK to shave your face.  Please follow these instructions carefully.   Shower the NIGHT BEFORE SURGERY and the MORNING OF SURGERY  If you chose to wash your hair, wash your hair first as usual with your normal shampoo.  After you shampoo, rinse your hair and body thoroughly to remove the shampoo.  Use CHG Soap as you would any other liquid soap. You can apply CHG directly to the skin and wash gently with a scrungie or a clean washcloth.   Apply the CHG Soap to your body ONLY FROM THE NECK DOWN.  Do not use on open wounds or open sores. Avoid contact with your eyes,  ears, mouth and genitals (private parts). Wash Face and genitals (private parts)  with your normal soap.   Wash thoroughly, paying special attention to the area where your surgery will be performed.  Thoroughly rinse your body with warm water from the neck down.  DO NOT shower/wash with your normal soap after using and rinsing off the CHG Soap.  Pat yourself dry with a CLEAN TOWEL.  Wear CLEAN PAJAMAS to bed the night before surgery  Place CLEAN SHEETS on your bed the night before your surgery  DO NOT SLEEP WITH PETS.   Day of Surgery: Shower with CHG soap. Do not wear jewelry,  make up, nail polish, gel polish, artificial nails, or any other type of covering on natural nails including finger and toenails. If patients have artificial nails, gel coating, etc. that need to be removed by a nail salon please have this removed prior to surgery. Surgery may need to be canceled/delayed if the surgeon/ anesthesia feels like the patient is unable to be adequately monitored. Do not wear lotions, powders, perfumes, or deodorant. Do not shave 48 hours prior to surgery.   Do not bring valuables to the hospital. [facility] is not responsible for any belongings or valuables. Wear Clean/Comfortable clothing the morning of surgery Remember to brush your teeth WITH YOUR REGULAR TOOTHPASTE.   Please read over the following fact sheets that you were given.

## 2021-04-16 ENCOUNTER — Inpatient Hospital Stay (HOSPITAL_COMMUNITY)
Admission: RE | Admit: 2021-04-16 | Discharge: 2021-04-16 | Disposition: A | Discharge status: Home / Self Care | Payer: 59 | Source: Ambulatory Visit

## 2021-04-16 NOTE — Progress Notes (Signed)
Contacted pt regarding missed PAT appointment. Pt stated that she forgot about the appt and is currently in the process of moving. Pt asked to be rescheduled. Forwarded call to [facility], surgical scheduler for rescheduling.    Jacqlyn Larsen, RN

## 2021-04-23 ENCOUNTER — Other Ambulatory Visit (HOSPITAL_COMMUNITY)
Admission: RE | Admit: 2021-04-23 | Discharge: 2021-04-23 | Disposition: A | Discharge status: Home / Self Care | Payer: 59 | Source: Ambulatory Visit | Attending: General Surgery | Admitting: General Surgery

## 2021-04-23 ENCOUNTER — Encounter (HOSPITAL_COMMUNITY): Payer: Self-pay

## 2021-04-23 ENCOUNTER — Other Ambulatory Visit: Payer: Self-pay

## 2021-04-23 ENCOUNTER — Encounter (HOSPITAL_COMMUNITY)
Admission: RE | Admit: 2021-04-23 | Discharge: 2021-04-23 | Disposition: A | Discharge status: Home / Self Care | Payer: 59 | Source: Ambulatory Visit | Attending: General Surgery | Admitting: General Surgery

## 2021-04-23 DIAGNOSIS — Z20822 Contact with and (suspected) exposure to covid-19: Secondary | ICD-10-CM | POA: Insufficient documentation

## 2021-04-23 DIAGNOSIS — Z01812 Encounter for preprocedural laboratory examination: Secondary | ICD-10-CM | POA: Insufficient documentation

## 2021-04-23 HISTORY — DX: Malignant (primary) neoplasm, unspecified: C80.1

## 2021-04-23 HISTORY — DX: Unspecified osteoarthritis, unspecified site: M19.90

## 2021-04-23 LAB — CBC
HCT: 38.3 % (ref 36.0–46.0)
Hemoglobin: 12.5 g/dL (ref 12.0–15.0)
MCH: 30.9 pg (ref 26.0–34.0)
MCHC: 32.6 g/dL (ref 30.0–36.0)
MCV: 94.6 fL (ref 80.0–100.0)
Platelets: 287 10*3/uL (ref 150–400)
RBC: 4.05 MIL/uL (ref 3.87–5.11)
RDW: 12.7 % (ref 11.5–15.5)
WBC: 6.1 10*3/uL (ref 4.0–10.5)
nRBC: 0 % (ref 0.0–0.2)

## 2021-04-23 LAB — BASIC METABOLIC PANEL
Anion gap: 6 (ref 5–15)
BUN: 13 mg/dL (ref 8–23)
CO2: 28 mmol/L (ref 22–32)
Calcium: 10.5 mg/dL — ABNORMAL HIGH (ref 8.9–10.3)
Chloride: 103 mmol/L (ref 98–111)
Creatinine, Ser: 0.8 mg/dL (ref 0.44–1.00)
GFR, Estimated: >60 mL/min (ref >60)
Glucose, Bld: 99 mg/dL (ref 70–99)
Potassium: 4.1 mmol/L (ref 3.5–5.1)
Sodium: 137 mmol/L (ref 135–145)

## 2021-04-23 LAB — SARS CORONAVIRUS 2 (TAT 6-24 HRS): SARS Coronavirus 2: NEGATIVE

## 2021-04-23 NOTE — Progress Notes (Signed)
PCP: Jillyn Ledger, FNP Cardiologist: denies  EKG: na CXR: na ECHO: denies Stress Test: denies Cardiac Cath: denies  Fasting Blood Sugar- na Checks Blood Sugar_na__ times a day  OSA/CPAP: No  ASA/Blood Thinners: No  Covid test 04/23/21  Anesthesia Review: NO  Patient denies shortness of breath, fever, cough, and chest pain at PAT appointment.  Patient verbalized understanding of instructions provided today at the PAT appointment.  Patient asked to review instructions at home and day of surgery.

## 2021-04-24 NOTE — Anesthesia Preprocedure Evaluation (Addendum)
Anesthesia Evaluation  Patient identified by MRN, date of birth, ID band Patient awake    Reviewed: Allergy & Precautions, H&P , NPO status , Patient's Chart, lab work & pertinent test results  Airway Mallampati: II  TM Distance: >3 FB Neck ROM: Full    Dental no notable dental hx. (+) Teeth Intact, Dental Advisory Given   Pulmonary neg pulmonary ROS, former smoker,    Pulmonary exam normal breath sounds clear to auscultation       Cardiovascular Exercise Tolerance: Good negative cardio ROS   Rhythm:Regular Rate:Normal     Neuro/Psych Anxiety negative neurological ROS     GI/Hepatic negative GI ROS, Neg liver ROS,   Endo/Other  negative endocrine ROS  Renal/GU negative Renal ROS  negative genitourinary   Musculoskeletal  (+) Arthritis , Osteoarthritis,    Abdominal   Peds  Hematology negative hematology ROS (+)   Anesthesia Other Findings   Reproductive/Obstetrics negative OB ROS                            Anesthesia Physical Anesthesia Plan  ASA: 2  Anesthesia Plan: General   Post-op Pain Management:  Regional for Post-op pain   Induction: Intravenous  PONV Risk Score and Plan: 3 and Ondansetron, Dexamethasone and Midazolam  Airway Management Planned: LMA  Additional Equipment:   Intra-op Plan:   Post-operative Plan: Extubation in OR  Informed Consent: I have reviewed the patients History and Physical, chart, labs and discussed the procedure including the risks, benefits and alternatives for the proposed anesthesia with the patient or authorized representative who has indicated his/her understanding and acceptance.     Dental advisory given  Plan Discussed with: CRNA  Anesthesia Plan Comments:        Anesthesia Quick Evaluation

## 2021-04-25 ENCOUNTER — Encounter (HOSPITAL_COMMUNITY): Payer: Self-pay | Admitting: General Surgery

## 2021-04-25 ENCOUNTER — Ambulatory Visit (HOSPITAL_COMMUNITY): Payer: 59 | Admitting: Anesthesiology

## 2021-04-25 ENCOUNTER — Ambulatory Visit (HOSPITAL_BASED_OUTPATIENT_CLINIC_OR_DEPARTMENT_OTHER): Admit: 2021-04-25 | Payer: 59 | Admitting: General Surgery

## 2021-04-25 ENCOUNTER — Encounter (HOSPITAL_BASED_OUTPATIENT_CLINIC_OR_DEPARTMENT_OTHER): Payer: Self-pay

## 2021-04-25 ENCOUNTER — Ambulatory Visit (HOSPITAL_COMMUNITY)
Admission: RE | Admit: 2021-04-25 | Discharge: 2021-04-25 | Disposition: A | Discharge status: Home / Self Care | Payer: 59 | Source: Ambulatory Visit | Attending: General Surgery | Admitting: General Surgery

## 2021-04-25 ENCOUNTER — Ambulatory Visit (HOSPITAL_COMMUNITY)
Admission: RE | Admit: 2021-04-25 | Discharge: 2021-04-26 | Disposition: A | Discharge status: Home / Self Care | Payer: 59 | Source: Ambulatory Visit | Attending: General Surgery | Admitting: General Surgery

## 2021-04-25 ENCOUNTER — Encounter (HOSPITAL_COMMUNITY)
Admission: RE | Disposition: A | Discharge status: Home / Self Care | Payer: Self-pay | Source: Ambulatory Visit | Attending: General Surgery

## 2021-04-25 ENCOUNTER — Other Ambulatory Visit: Payer: Self-pay

## 2021-04-25 DIAGNOSIS — Z79890 Hormone replacement therapy (postmenopausal): Secondary | ICD-10-CM | POA: Insufficient documentation

## 2021-04-25 DIAGNOSIS — C50311 Malignant neoplasm of lower-inner quadrant of right female breast: Secondary | ICD-10-CM | POA: Insufficient documentation

## 2021-04-25 DIAGNOSIS — Z91041 Radiographic dye allergy status: Secondary | ICD-10-CM | POA: Diagnosis not present

## 2021-04-25 DIAGNOSIS — Z87891 Personal history of nicotine dependence: Secondary | ICD-10-CM | POA: Insufficient documentation

## 2021-04-25 DIAGNOSIS — C50911 Malignant neoplasm of unspecified site of right female breast: Secondary | ICD-10-CM | POA: Diagnosis present

## 2021-04-25 DIAGNOSIS — Z79899 Other long term (current) drug therapy: Secondary | ICD-10-CM | POA: Diagnosis not present

## 2021-04-25 DIAGNOSIS — Z170 Estrogen receptor positive status [ER+]: Secondary | ICD-10-CM | POA: Insufficient documentation

## 2021-04-25 HISTORY — PX: MASTECTOMY W/ SENTINEL NODE BIOPSY: SHX2001

## 2021-04-25 SURGERY — MASTECTOMY, SIMPLE
Anesthesia: General | Site: Breast | Laterality: Right

## 2021-04-25 SURGERY — MASTECTOMY WITH SENTINEL LYMPH NODE BIOPSY
Anesthesia: General | Site: Breast | Laterality: Right

## 2021-04-25 MED ORDER — FENTANYL CITRATE (PF) 250 MCG/5ML IJ SOLN
INTRAMUSCULAR | Status: AC
  Filled 2021-04-25: qty 5

## 2021-04-25 MED ORDER — ONDANSETRON HCL 4 MG/2ML IJ SOLN
INTRAMUSCULAR | Status: AC
  Filled 2021-04-25: qty 2

## 2021-04-25 MED ORDER — CHLORHEXIDINE GLUCONATE 0.12 % MT SOLN
OROMUCOSAL | Status: AC
  Administered 2021-04-25: 15 mL
  Filled 2021-04-25: qty 15

## 2021-04-25 MED ORDER — PANTOPRAZOLE SODIUM 40 MG IV SOLR
40.0000 mg | Freq: Every day | INTRAVENOUS | Status: DC
  Administered 2021-04-25: 40 mg via INTRAVENOUS
  Filled 2021-04-25: qty 40

## 2021-04-25 MED ORDER — METHYLENE BLUE 0.5 % INJ SOLN
INTRAVENOUS | Status: AC
  Filled 2021-04-25: qty 10

## 2021-04-25 MED ORDER — HEPARIN SODIUM (PORCINE) 5000 UNIT/ML IJ SOLN
5000.0000 [IU] | Freq: Three times a day (TID) | INTRAMUSCULAR | Status: DC
  Administered 2021-04-26: 5000 [IU] via SUBCUTANEOUS
  Filled 2021-04-25: qty 1

## 2021-04-25 MED ORDER — SODIUM CHLORIDE 0.9 % IV SOLN
INTRAVENOUS | Status: DC
  Administered 2021-04-25: 1000 mL via INTRAVENOUS

## 2021-04-25 MED ORDER — ACETAMINOPHEN 500 MG PO TABS
ORAL_TABLET | ORAL | Status: AC
  Filled 2021-04-25: qty 2

## 2021-04-25 MED ORDER — ACETAMINOPHEN 10 MG/ML IV SOLN
INTRAVENOUS | Status: AC
  Filled 2021-04-25: qty 100

## 2021-04-25 MED ORDER — HYDROCODONE-ACETAMINOPHEN 5-325 MG PO TABS: 1.0000 | ORAL_TABLET | ORAL | Status: DC | PRN

## 2021-04-25 MED ORDER — MORPHINE SULFATE (PF) 2 MG/ML IV SOLN: 1.0000 mg | INTRAVENOUS | Status: DC | PRN

## 2021-04-25 MED ORDER — CELECOXIB 200 MG PO CAPS: 200.0000 mg | ORAL_CAPSULE | ORAL | Status: AC

## 2021-04-25 MED ORDER — PROPOFOL 10 MG/ML IV BOLUS
INTRAVENOUS | Status: DC | PRN
  Administered 2021-04-25: 150 mg via INTRAVENOUS

## 2021-04-25 MED ORDER — CHLORHEXIDINE GLUCONATE CLOTH 2 % EX PADS: 6.0000 | MEDICATED_PAD | Freq: Once | CUTANEOUS | Status: DC

## 2021-04-25 MED ORDER — FENTANYL CITRATE (PF) 100 MCG/2ML IJ SOLN
INTRAMUSCULAR | Status: AC
  Filled 2021-04-25: qty 2

## 2021-04-25 MED ORDER — LIDOCAINE 2% (20 MG/ML) 5 ML SYRINGE
INTRAMUSCULAR | Status: DC | PRN
  Administered 2021-04-25: 40 mg via INTRAVENOUS

## 2021-04-25 MED ORDER — LEVOCETIRIZINE DIHYDROCHLORIDE 5 MG PO TABS: 5.0000 mg | ORAL_TABLET | Freq: Every day | ORAL | Status: DC | PRN

## 2021-04-25 MED ORDER — PROPOFOL 10 MG/ML IV BOLUS
INTRAVENOUS | Status: AC
  Filled 2021-04-25: qty 40

## 2021-04-25 MED ORDER — DEXAMETHASONE SODIUM PHOSPHATE 10 MG/ML IJ SOLN
INTRAMUSCULAR | Status: DC | PRN
  Administered 2021-04-25: 10 mg via INTRAVENOUS

## 2021-04-25 MED ORDER — CEFAZOLIN SODIUM-DEXTROSE 2-4 GM/100ML-% IV SOLN
2.0000 g | INTRAVENOUS | Status: AC
  Administered 2021-04-25: 2 g via INTRAVENOUS

## 2021-04-25 MED ORDER — LIDOCAINE 2% (20 MG/ML) 5 ML SYRINGE
INTRAMUSCULAR | Status: AC
  Filled 2021-04-25: qty 5

## 2021-04-25 MED ORDER — SCOPOLAMINE 1 MG/3DAYS TD PT72
MEDICATED_PATCH | TRANSDERMAL | Status: AC
  Administered 2021-04-25: 1.5 mg via TRANSDERMAL
  Filled 2021-04-25: qty 1

## 2021-04-25 MED ORDER — CEFAZOLIN SODIUM-DEXTROSE 2-4 GM/100ML-% IV SOLN
INTRAVENOUS | Status: AC
  Filled 2021-04-25: qty 100

## 2021-04-25 MED ORDER — ACETAMINOPHEN 500 MG PO TABS: 1000.0000 mg | ORAL_TABLET | ORAL | Status: DC

## 2021-04-25 MED ORDER — GABAPENTIN 300 MG PO CAPS
ORAL_CAPSULE | ORAL | Status: AC
  Administered 2021-04-25: 300 mg via ORAL
  Filled 2021-04-25: qty 1

## 2021-04-25 MED ORDER — DEXAMETHASONE SODIUM PHOSPHATE 10 MG/ML IJ SOLN
INTRAMUSCULAR | Status: AC
  Filled 2021-04-25: qty 1

## 2021-04-25 MED ORDER — MIDAZOLAM HCL 2 MG/2ML IJ SOLN
INTRAMUSCULAR | Status: AC
  Filled 2021-04-25: qty 2

## 2021-04-25 MED ORDER — CYCLOBENZAPRINE HCL 10 MG PO TABS: 10.0000 mg | ORAL_TABLET | Freq: Every day | ORAL | Status: DC | PRN

## 2021-04-25 MED ORDER — ONDANSETRON 4 MG PO TBDP: 4.0000 mg | ORAL_TABLET | Freq: Four times a day (QID) | ORAL | Status: DC | PRN

## 2021-04-25 MED ORDER — CELECOXIB 200 MG PO CAPS
ORAL_CAPSULE | ORAL | Status: AC
  Administered 2021-04-25: 200 mg via ORAL
  Filled 2021-04-25: qty 1

## 2021-04-25 MED ORDER — FENTANYL CITRATE (PF) 100 MCG/2ML IJ SOLN
INTRAMUSCULAR | Status: DC | PRN
  Administered 2021-04-25: 50 ug via INTRAVENOUS
  Administered 2021-04-25: 100 ug via INTRAVENOUS

## 2021-04-25 MED ORDER — 0.9 % SODIUM CHLORIDE (POUR BTL) OPTIME
TOPICAL | Status: DC | PRN
  Administered 2021-04-25: 1000 mL

## 2021-04-25 MED ORDER — TECHNETIUM TC 99M TILMANOCEPT KIT
1.0000 | PACK | Freq: Once | INTRAVENOUS | Status: AC | PRN
  Administered 2021-04-25: 1 via INTRADERMAL

## 2021-04-25 MED ORDER — ROCURONIUM BROMIDE 10 MG/ML (PF) SYRINGE
PREFILLED_SYRINGE | INTRAVENOUS | Status: AC
  Filled 2021-04-25: qty 10

## 2021-04-25 MED ORDER — ONDANSETRON HCL 4 MG/2ML IJ SOLN: 4.0000 mg | Freq: Four times a day (QID) | INTRAMUSCULAR | Status: DC | PRN

## 2021-04-25 MED ORDER — EPHEDRINE SULFATE-NACL 50-0.9 MG/10ML-% IV SOSY
PREFILLED_SYRINGE | INTRAVENOUS | Status: DC | PRN
  Administered 2021-04-25 (×2): 10 mg via INTRAVENOUS

## 2021-04-25 MED ORDER — MIDAZOLAM HCL 2 MG/2ML IJ SOLN
INTRAMUSCULAR | Status: DC | PRN
  Administered 2021-04-25 (×2): 2 mg via INTRAVENOUS

## 2021-04-25 MED ORDER — ACETAMINOPHEN 10 MG/ML IV SOLN
INTRAVENOUS | Status: DC | PRN
  Administered 2021-04-25: 1000 mg via INTRAVENOUS

## 2021-04-25 MED ORDER — LEVOTHYROXINE SODIUM 50 MCG PO TABS
50.0000 ug | ORAL_TABLET | Freq: Every day | ORAL | Status: DC
  Administered 2021-04-26: 50 ug via ORAL
  Filled 2021-04-25: qty 1

## 2021-04-25 MED ORDER — LORATADINE 10 MG PO TABS: 10.0000 mg | ORAL_TABLET | Freq: Every day | ORAL | Status: DC | PRN

## 2021-04-25 MED ORDER — BUPIVACAINE LIPOSOME 1.3 % IJ SUSP
INTRAMUSCULAR | Status: DC | PRN
  Administered 2021-04-25: 10 mL

## 2021-04-25 MED ORDER — ALPRAZOLAM 0.5 MG PO TABS: 0.5000 mg | ORAL_TABLET | Freq: Two times a day (BID) | ORAL | Status: DC | PRN

## 2021-04-25 MED ORDER — SCOPOLAMINE 1 MG/3DAYS TD PT72: 1.0000 | MEDICATED_PATCH | TRANSDERMAL | Status: DC

## 2021-04-25 MED ORDER — HYDROCODONE-ACETAMINOPHEN 5-325 MG PO TABS: 1.0000 | ORAL_TABLET | Freq: Four times a day (QID) | ORAL | 0 refills | Status: DC | PRN

## 2021-04-25 MED ORDER — ONDANSETRON HCL 4 MG/2ML IJ SOLN
INTRAMUSCULAR | Status: DC | PRN
  Administered 2021-04-25: 4 mg via INTRAVENOUS

## 2021-04-25 MED ORDER — BUPIVACAINE-EPINEPHRINE (PF) 0.5% -1:200000 IJ SOLN
INTRAMUSCULAR | Status: DC | PRN
  Administered 2021-04-25: 20 mL

## 2021-04-25 MED ORDER — SODIUM CHLORIDE (PF) 0.9 % IJ SOLN
INTRAMUSCULAR | Status: AC
  Filled 2021-04-25: qty 10

## 2021-04-25 MED ORDER — GABAPENTIN 300 MG PO CAPS: 300.0000 mg | ORAL_CAPSULE | ORAL | Status: AC

## 2021-04-25 MED ORDER — HYDROMORPHONE HCL 1 MG/ML IJ SOLN: 0.2500 mg | INTRAMUSCULAR | Status: DC | PRN

## 2021-04-25 MED ORDER — LACTATED RINGERS IV SOLN: INTRAVENOUS | Status: DC

## 2021-04-25 MED ORDER — ANASTROZOLE 1 MG PO TABS
1.0000 mg | ORAL_TABLET | Freq: Every evening | ORAL | Status: DC
  Administered 2021-04-25: 1 mg via ORAL
  Filled 2021-04-25 (×2): qty 1

## 2021-04-25 SURGICAL SUPPLY — 58 items
ADH SKN CLS APL DERMABOND .7 (GAUZE/BANDAGES/DRESSINGS) ×1
APL PRP STRL LF DISP 70% ISPRP (MISCELLANEOUS) ×1
APPLIER CLIP 9.375 MED OPEN (MISCELLANEOUS) ×2
APR CLP MED 9.3 20 MLT OPN (MISCELLANEOUS) ×1
BAG COUNTER SPONGE SURGICOUNT (BAG) ×2 IMPLANT
BAG SPNG CNTER NS LX DISP (BAG) ×1
BINDER BREAST LRG (GAUZE/BANDAGES/DRESSINGS) IMPLANT
BINDER BREAST XLRG (GAUZE/BANDAGES/DRESSINGS) ×1 IMPLANT
BIOPATCH RED 1 DISK 7.0 (GAUZE/BANDAGES/DRESSINGS) ×2 IMPLANT
CANISTER SUCT 3000ML PPV (MISCELLANEOUS) ×2 IMPLANT
CHLORAPREP W/TINT 26 (MISCELLANEOUS) ×2 IMPLANT
CLIP APPLIE 9.375 MED OPEN (MISCELLANEOUS) ×1 IMPLANT
CNTNR URN SCR LID CUP LEK RST (MISCELLANEOUS) ×1 IMPLANT
CONT SPEC 4OZ STRL OR WHT (MISCELLANEOUS) ×2
COVER PROBE W GEL 5X96 (DRAPES) ×2 IMPLANT
COVER SURGICAL LIGHT HANDLE (MISCELLANEOUS) ×2 IMPLANT
DERMABOND ADVANCED (GAUZE/BANDAGES/DRESSINGS) ×1
DERMABOND ADVANCED .7 DNX12 (GAUZE/BANDAGES/DRESSINGS) ×1 IMPLANT
DEVICE DSSCT PLSMBLD 3.0S LGHT (MISCELLANEOUS) IMPLANT
DRAIN CHANNEL 19F RND (DRAIN) ×2 IMPLANT
DRAPE CHEST BREAST 15X10 FENES (DRAPES) ×2 IMPLANT
DRSG PAD ABDOMINAL 8X10 ST (GAUZE/BANDAGES/DRESSINGS) ×2 IMPLANT
DRSG TEGADERM 4X4.75 (GAUZE/BANDAGES/DRESSINGS) ×2 IMPLANT
ELECT CAUTERY BLADE 6.4 (BLADE) ×2 IMPLANT
ELECT REM PT RETURN 9FT ADLT (ELECTROSURGICAL) ×2
ELECTRODE REM PT RTRN 9FT ADLT (ELECTROSURGICAL) ×1 IMPLANT
EVACUATOR SILICONE 100CC (DRAIN) ×2 IMPLANT
FILTER IN LINE W/DETACHED HOSE (FILTER) ×2 IMPLANT
GLOVE SURG ENC MOIS LTX SZ7.5 (GLOVE) ×2 IMPLANT
GOWN STRL REUS W/ TWL LRG LVL3 (GOWN DISPOSABLE) ×2 IMPLANT
GOWN STRL REUS W/TWL LRG LVL3 (GOWN DISPOSABLE) ×4
KIT BASIN OR (CUSTOM PROCEDURE TRAY) ×2 IMPLANT
KIT TURNOVER KIT B (KITS) ×2 IMPLANT
LIGHT WAVEGUIDE WIDE FLAT (MISCELLANEOUS) IMPLANT
NDL 18GX1X1/2 (RX/OR ONLY) (NEEDLE) IMPLANT
NDL FILTER BLUNT 18X1 1/2 (NEEDLE) IMPLANT
NDL HYPO 25GX1X1/2 BEV (NEEDLE) IMPLANT
NEEDLE 18GX1X1/2 (RX/OR ONLY) (NEEDLE) IMPLANT
NEEDLE FILTER BLUNT 18X 1/2SAF (NEEDLE)
NEEDLE FILTER BLUNT 18X1 1/2 (NEEDLE) IMPLANT
NEEDLE HYPO 25GX1X1/2 BEV (NEEDLE) IMPLANT
NS IRRIG 1000ML POUR BTL (IV SOLUTION) ×2 IMPLANT
PACK GENERAL/GYN (CUSTOM PROCEDURE TRAY) ×2 IMPLANT
PAD ARMBOARD 7.5X6 YLW CONV (MISCELLANEOUS) ×2 IMPLANT
PENCIL SMOKE EVACUATOR (MISCELLANEOUS) ×2 IMPLANT
PLASMABLADE 3.0S W/LIGHT (MISCELLANEOUS)
SPECIMEN JAR X LARGE (MISCELLANEOUS) ×2 IMPLANT
SUT ETHILON 3 0 FSL (SUTURE) ×2 IMPLANT
SUT MNCRL AB 4-0 PS2 18 (SUTURE) ×2 IMPLANT
SUT VIC AB 0 CT1 27 (SUTURE) ×4
SUT VIC AB 0 CT1 27XBRD ANBCTR (SUTURE) ×2 IMPLANT
SUT VIC AB 3-0 54X BRD REEL (SUTURE) IMPLANT
SUT VIC AB 3-0 BRD 54 (SUTURE)
SUT VIC AB 3-0 SH 18 (SUTURE) ×2 IMPLANT
SYR CONTROL 10ML LL (SYRINGE) IMPLANT
TOWEL GREEN STERILE (TOWEL DISPOSABLE) ×2 IMPLANT
TOWEL GREEN STERILE FF (TOWEL DISPOSABLE) ×2 IMPLANT
TUBE CONNECTING 12X1/4 (SUCTIONS) IMPLANT

## 2021-04-25 NOTE — Anesthesia Procedure Notes (Signed)
Anesthesia Regional Block: Pectoralis block   Pre-Anesthetic Checklist: , timeout performed,  Correct Patient, Correct Site, Correct Laterality,  Correct Procedure, Correct Position, site marked,  Risks and benefits discussed,  Pre-op evaluation,  At surgeon's request and post-op pain management  Laterality: Right  Prep: Maximum Sterile Barrier Precautions used, chloraprep       Needles:  Injection technique: Single-shot  Needle Type: Echogenic Stimulator Needle     Needle Length: 9cm  Needle Gauge: 21     Additional Needles:   Procedures:,,,, ultrasound used (permanent image in chart),,    Narrative:  Start time: 04/25/2021 8:10 AM End time: 04/25/2021 8:20 AM Injection made incrementally with aspirations every 5 mL. Anesthesiologist: Roderic Palau, MD  Additional Notes: 2% Lidocaine skin wheel.

## 2021-04-25 NOTE — Transfer of Care (Signed)
Immediate Anesthesia Transfer of Care Note  Patient: Sydney Moody  Procedure(s) Performed: RIGHT MASTECTOMY WITH SENTINEL LYMPH NODE BIOPSY (Right: Breast)  Patient Location: PACU  Anesthesia Type:General  Level of Consciousness: sedated  Airway & Oxygen Therapy: Patient Spontanous Breathing, Patient connected to nasal cannula oxygen and oral airway  Post-op Assessment: Report given to RN and Post -op Vital signs reviewed and stable  Post vital signs: Reviewed and stable  Last Vitals:  Vitals Value Taken Time  BP 117/56 04/25/21 1032  Temp 36.5 C 04/25/21 1030  Pulse 78 04/25/21 1034  Resp 13 04/25/21 1034  SpO2 100 % 04/25/21 1034  Vitals shown include unvalidated device data.  Last Pain:  Vitals:   04/25/21 0705  TempSrc:   PainSc: 0-No pain         Complications: No notable events documented.

## 2021-04-25 NOTE — Op Note (Addendum)
04/25/2021  10:20 AM  PATIENT:  Sydney Moody  65 y.o. female  PRE-OPERATIVE DIAGNOSIS:  RIGHT BREAST CANCER  POST-OPERATIVE DIAGNOSIS:  RIGHT BREAST CANCER  PROCEDURE:  Procedure(s): RIGHT MASTECTOMY WITH SENTINEL LYMPH NODE BIOPSY (Right)  SURGEON:  Surgeon(s) and Role:    * Jovita Kussmaul, MD - Primary  PHYSICIAN ASSISTANT:   ASSISTANTS: none   ANESTHESIA:   general  EBL:  minimal   BLOOD ADMINISTERED:none  DRAINS: (1) Jackson-Pratt drain(s) with closed bulb suction in the prepectoral space    LOCAL MEDICATIONS USED:  NONE  SPECIMEN:  Source of Specimen:  right mastectomy and sentinel node  DISPOSITION OF SPECIMEN:  PATHOLOGY  COUNTS:  YES  TOURNIQUET:  * No tourniquets in log *  DICTATION: .Dragon Dictation  After informed consent was obtained the patient was brought to the operating room and placed in the supine position on the operating table.  After adequate induction of general anesthesia the patient's right chest, breast, and axillary area were prepped with ChloraPrep, allowed to dry, and draped in usual sterile manner.  An appropriate timeout was performed.  Earlier in the day the patient underwent injection of 1 mCi of technetium sulfur colloid in the subareolar position on the right.  The neoprobe was set to technetium and an area of radioactivity was readily identified in the right axilla.  An elliptical incision was then made with a 10 blade knife around the nipple and areola complex in order to minimize the excess skin.  The incision was carried through the skin and subcutaneous tissue sharply with the PlasmaBlade.  Breast hooks were used to elevate the skin flaps anteriorly towards the ceiling.  Thin skin flaps were then created by dissection between the breast tissue and the subcutaneous fat.  This dissection was carried all the way to the chest wall circumferentially.  Next the breast was removed from the pectoralis muscle with the pectoralis fascia sharply  with the PlasmaBlade.  Once this dissection was accomplished then the entire right breast was removed from the patient.  It was marked with a stitch on the lateral skin and sent to pathology for further evaluation.  The neoprobe was then used to direct dissection into the deep right axillary space.  Blunt hemostat dissection was carried out under the direction of the neoprobe.  I was able to identify a hot lymph node.  This lymph node was excised sharply with the PlasmaBlade and the surrounding small vessels and lymphatics were controlled with clips.  There may have been two nodes together that were removed.  Ex vivo counts on this node were was approximately 300.  No other hot or palpable nodes were identified in the right axilla.  Hemostasis was achieved using the PlasmaBlade.  The wound was irrigated with copious amounts of saline.  A small stab incision was made near the anterior axillary line inferior to the operative bed.  A hemostat was placed through this opening and used to bring a 19 [place] round Blake drain into the operative bed.  The drain was curled along the chest wall.  The drain was anchored to the skin with a 3-0 nylon stitch.  Next the superior and inferior skin flaps were grossly reapproximated with interrupted 3-0 Vicryl stitches.  The skin was then closed with a running 4-0 Monocryl subcuticular stitch.  Dermabond dressings and drain dressings were applied.  The patient tolerated the procedure well.  At the end of the case all needle sponge and instrument counts were correct.  The patient was then awakened and taken recovery in stable condition.  The drain was placed to bulb suction and there was a good seal.  The skin flaps appeared healthy  PLAN OF CARE: Admit for overnight observation  PATIENT DISPOSITION:  PACU - hemodynamically stable.   Delay start of Pharmacological VTE agent (>24hrs) due to surgical blood loss or risk of bleeding: no

## 2021-04-25 NOTE — Progress Notes (Signed)
Page placed to call back [phone]

## 2021-04-25 NOTE — Progress Notes (Signed)
In Pyxis, wasted 65mcg of Fentanyl on incorrect patient Folden).    Merleen Nicely with Pyxis / Narcotics notified of discrepancy.  Kelsey instructed to make a progress note.

## 2021-04-25 NOTE — Progress Notes (Signed)
Patient brought to the unit by RN. Incision to the R breast and JP drain attached. Wound incision is clean, dry. NO other skin impairement noted at this time.

## 2021-04-25 NOTE — Progress Notes (Signed)
Secure chat sent to Dr. Marlou Starks for patient's diet order, no answer yet. Paged [facility] and no answer.

## 2021-04-25 NOTE — Anesthesia Postprocedure Evaluation (Signed)
Anesthesia Post Note  Patient: Sydney Moody  Procedure(s) Performed: RIGHT MASTECTOMY WITH SENTINEL LYMPH NODE BIOPSY (Right: Breast)     Patient location during evaluation: PACU Anesthesia Type: General and Regional Level of consciousness: awake and alert Pain management: pain level controlled Vital Signs Assessment: post-procedure vital signs reviewed and stable Respiratory status: spontaneous breathing, nonlabored ventilation and respiratory function stable Cardiovascular status: blood pressure returned to baseline and stable Postop Assessment: no apparent nausea or vomiting Anesthetic complications: no   No notable events documented.  Last Vitals:  Vitals:   04/25/21 1100 04/25/21 1115  BP: 116/61 129/61  Pulse: 82 81  Resp: 13 12  Temp:  (!) 36.4 C  SpO2: 100% 98%    Last Pain:  Vitals:   04/25/21 1115  TempSrc:   PainSc: 0-No pain                 Ardian Haberland,W. EDMOND

## 2021-04-25 NOTE — H&P (Signed)
Sydney Moody Location: [facility] Patient #: [id number] DOB: [birth year] Single / Language: [name] / Race: White Female   History of Present Illness  The patient is a 65 year old female who presents for a follow-up for Breast cancer. The patient is a 65 year old white female who we saw initially about a year ago with a 6 cm lobular cancer in the lower inner quadrant of the right breast with clinically negative nodes.  She was treated with neoadjuvant hormonal therapy.  She has had some response but still has about 5.4 cm of enhancement in the area.  Her lymph nodes still looked negative.  At this point she is ready to schedule her definitive surgery.  She does state that she is moving from one home to another in late June so she does not want her surgery until early July   Allergies  Iodinated Contrast Media   Allergies Reconciled    Medication History  Anastrozole  (1MG  Tablet, Oral) Active. Synthroid  (50MCG Tablet, Oral) Active. ALPRAZolam  (0.5MG  Tablet, Oral) Active. Medications Reconciled     Review of Systems General Not Present- Appetite Loss, Chills, Fatigue, Fever, Night Sweats, Weight Gain and Weight Loss. Skin Not Present- Change in Wart/Mole, Dryness, Hives, Jaundice, New Lesions, Non-Healing Wounds, Rash and Ulcer. HEENT Present- Seasonal Allergies and Wears glasses/contact lenses. Not Present- Earache, Hearing Loss, Hoarseness, Nose Bleed, Oral Ulcers, Ringing in the Ears, Sinus Pain, Sore Throat, Visual Disturbances and Yellow Eyes. Respiratory Present- Snoring. Not Present- Bloody sputum, Chronic Cough, Difficulty Breathing and Wheezing. Breast Present- Breast Mass. Not Present- Breast Pain, Nipple Discharge and Skin Changes. Cardiovascular Not Present- Chest Pain, Difficulty Breathing Lying Down, Leg Cramps, Palpitations, Rapid Heart Rate, Shortness of Breath and Swelling of Extremities. Gastrointestinal Present- Hemorrhoids. Not Present- Abdominal  Pain, Bloating, Bloody Stool, Change in Bowel Habits, Chronic diarrhea, Constipation, Difficulty Swallowing, Excessive gas, Gets full quickly at meals, Indigestion, Nausea, Rectal Pain and Vomiting. Female Genitourinary Not Present- Frequency, Nocturia, Painful Urination, Pelvic Pain and Urgency. Musculoskeletal Present- Back Pain, Joint Pain, Joint Stiffness and Muscle Weakness. Not Present- Muscle Pain and Swelling of Extremities. Neurological Present- Numbness and Tingling. Not Present- Decreased Memory, Fainting, Headaches, Seizures, Tremor, Trouble walking and Weakness. Psychiatric Present- Anxiety. Not Present- Bipolar, Change in Sleep Pattern, Depression, Fearful and Frequent crying. Endocrine Present- Cold Intolerance and Heat Intolerance. Not Present- Excessive Hunger, Hair Changes, Hot flashes and New Diabetes. Hematology Not Present- Blood Thinners, Easy Bruising, Excessive bleeding, Gland problems, HIV and Persistent Infections.  Vitals  Weight: 151.38 lb   Temp.: 97.5 F    Pulse: 76 (Regular)    P.OX: 99% (Room air) BP: 124/68(Sitting, Left Arm, Standard)       Physical Exam  General Mental Status - Alert. General Appearance - Consistent with stated age. Hydration - Well hydrated. Voice - Normal.  Head and Neck Head - normocephalic, atraumatic with no lesions or palpable masses. Trachea - midline. Thyroid Gland Characteristics - normal size and consistency.  Eye Eyeball - Bilateral - Extraocular movements intact. Sclera/Conjunctiva - Bilateral - No scleral icterus.  Chest and Lung Exam Chest and lung exam reveals  - quiet, even and easy respiratory effort with no use of accessory muscles and on auscultation, normal breath sounds, no adventitious sounds and normal vocal resonance. Inspection Chest Wall - Normal. Back - normal.  Breast Note:  There is no palpable mass in either breast. There is no palpable axillary, supraclavicular, or cervical  lymphadenopathy.   Cardiovascular Cardiovascular examination reveals  -  normal heart sounds, regular rate and rhythm with no murmurs and normal pedal pulses bilaterally.  Abdomen Inspection Inspection of the abdomen reveals - No Hernias. Skin - Scar - no surgical scars. Palpation/Percussion Palpation and Percussion of the abdomen reveal - Soft, Non Tender, No Rebound tenderness, No Rigidity (guarding) and No hepatosplenomegaly. Auscultation Auscultation of the abdomen reveals - Bowel sounds normal.  Neurologic Neurologic evaluation reveals  - alert and oriented x 3 with no impairment of recent or remote memory. Mental Status - Normal.  Musculoskeletal Normal Exam - Left - Upper Extremity Strength Normal and Lower Extremity Strength Normal. Normal Exam - Right - Upper Extremity Strength Normal and Lower Extremity Strength Normal.  Lymphatic Head & Neck  General Head & Neck Lymphatics: Bilateral - Description - Normal. Axillary  General Axillary Region: Bilateral - Description - Normal. Tenderness - Non Tender. Femoral & Inguinal  Generalized Femoral & Inguinal Lymphatics: Bilateral - Description - Normal. Tenderness - Non Tender.    Assessment & Plan  MALIGNANT NEOPLASM OF LOWER-INNER QUADRANT OF RIGHT BREAST OF FEMALE, ESTROGEN RECEPTOR POSITIVE (C50.311) Impression: The patient was originally diagnosed with a lobular cancer of the lower inner right breast about a year ago. She has been treated during that time with anti-estrogens. Her recent MRI shows improvement but there is still about 5.4 cm of enhancement in the area. I have discussed with her the options for treatment and at this point we both feel that mastectomy is likely the best option for her. She is still a good candidate for sentinel node mapping. I have discussed with her in detail the risks and benefits of the operation as well as some of the technical aspects and she understands and wishes to proceed. We will plan  this around her moving date in June so it will likely be done in early July. This patient encounter took 20 minutes today to perform the following: take history, perform exam, review outside records, interpret imaging, counsel the patient on their diagnosis and document encounter, findings & plan in the EHR

## 2021-04-25 NOTE — Interval H&P Note (Signed)
History and Physical Interval Note:  04/25/2021 7:57 AM  Sydney Moody  has presented today for surgery, with the diagnosis of RIGHT BREAST CANCER.  The various methods of treatment have been discussed with the patient and family. After consideration of risks, benefits and other options for treatment, the patient has consented to  Procedure(s): RIGHT MASTECTOMY WITH SENTINEL LYMPH NODE BIOPSY (Right) as a surgical intervention.  The patient's history has been reviewed, patient examined, no change in status, stable for surgery.  I have reviewed the patient's chart and labs.  Questions were answered to the patient's satisfaction.     Autumn Messing III

## 2021-04-25 NOTE — Addendum Note (Signed)
Addendum  created 04/25/21 1304 by Leonor Liv, CRNA   Intraprocedure Event edited

## 2021-04-26 ENCOUNTER — Encounter (HOSPITAL_COMMUNITY): Payer: Self-pay | Admitting: General Surgery

## 2021-04-26 DIAGNOSIS — C50311 Malignant neoplasm of lower-inner quadrant of right female breast: Secondary | ICD-10-CM | POA: Diagnosis not present

## 2021-04-26 NOTE — Progress Notes (Signed)
Discharge summary reviewed with patient. All questions were answered. Belongings given to patient. Education on JP drain done with positive learning. Patient left room ambulating with steady gait. VSS.

## 2021-04-26 NOTE — Progress Notes (Signed)
1 Day Post-Op   Subjective/Chief Complaint: No complaints   Objective: Vital signs in last 24 hours: Temp:  [97.5 F (36.4 C)-98.4 F (36.9 C)] 98.2 F (36.8 C) (07/08 0802) Pulse Rate:  [61-86] 61 (07/08 0802) Resp:  [12-18] 18 (07/08 0802) BP: (106-132)/(54-92) 106/55 (07/08 0802) SpO2:  [96 %-100 %] 96 % (07/08 0802) Last BM Date: 04/25/21  Intake/Output from previous day: 07/07 0701 - 07/08 0700 In: 1540 [P.O.:240; I.V.:1300] Out: 55 [Drains:40; Blood:15] Intake/Output this shift: No intake/output data recorded.  General appearance: alert and cooperative Resp: clear to auscultation bilaterally Chest wall: skin flaps look good Cardio: regular rate and rhythm GI: soft, non-tender; bowel sounds normal; no masses,  no organomegaly  Lab Results:  Recent Labs    04/23/21 1410  WBC 6.1  HGB 12.5  HCT 38.3  PLT 287   BMET Recent Labs    04/23/21 1410  NA 137  K 4.1  CL 103  CO2 28  GLUCOSE 99  BUN 13  CREATININE 0.80  CALCIUM 10.5*   PT/INR No results for input(s): LABPROT, INR in the last 72 hours. ABG No results for input(s): PHART, HCO3 in the last 72 hours.  Invalid input(s): PCO2, PO2  Studies/Results: NM Sentinel Node Inj-No Rpt (Breast)  Result Date: 04/25/2021 Sulfur Colloid was injected by the Nuclear Medicine Technologist for sentinel lymph node localization.    Anti-infectives: Anti-infectives (From admission, onward)    Start     Dose/Rate Route Frequency Ordered Stop   04/25/21 0645  ceFAZolin (ANCEF) IVPB 2g/100 mL premix        2 g 200 mL/hr over 30 Minutes Intravenous On call to O.R. 04/25/21 1540 04/25/21 0908   04/25/21 0645  ceFAZolin (ANCEF) 2-4 GM/100ML-% IVPB       Note to Pharmacy: Gustavo Lah   : cabinet override      04/25/21 0645 04/25/21 0914       Assessment/Plan: s/p Procedure(s): RIGHT MASTECTOMY WITH SENTINEL LYMPH NODE BIOPSY (Right) Advance diet Discharge Teach pt drain care  LOS: 0 days    Autumn Messing  III 04/26/2021

## 2021-04-29 ENCOUNTER — Encounter: Payer: Self-pay | Admitting: *Deleted

## 2021-04-30 ENCOUNTER — Encounter: Payer: Self-pay | Admitting: *Deleted

## 2021-05-01 NOTE — Progress Notes (Signed)
Patient Care Team: Kristen Loader, FNP as PCP - General (Family Medicine) Mauro Kaufmann, RN as Oncology Nurse Navigator Rockwell Germany, RN as Oncology Nurse Navigator  DIAGNOSIS:    ICD-10-CM   1. Malignant neoplasm of lower-inner quadrant of right breast of female, estrogen receptor positive ([place])  C50.311    Z17.0       SUMMARY OF ONCOLOGIC HISTORY: Oncology History  Malignant neoplasm of lower-inner quadrant of right breast of female, estrogen receptor positive ([place])  10/27/2019 Cancer Staging   Staging form: Breast, AJCC 8th Edition - Clinical stage from 10/27/2019: Stage IA (cT1c, cN0, cM0, G2, ER+, PR+, HER2-) - Signed by Gardenia Phlegm, NP on 11/02/2019    11/02/2019 Initial Diagnosis   Screening mammogram detected a right breast distortion. Diagnostic mammogram showed a 1.8cm right breast mass, 4:00 position, with abnormal tissue surrounding the mass measuring a total of 4.9cm, no right axillary adenopathy. Biopsy showed invasive mammary carcinoma with lobular features, grade 2, HER-2 - (1+), ER+ 95%, PR+ 2%, KI67 5%.    04/25/2021 Surgery   Right mastectomy: Grade 2 ILC 5.2 cm, 0/3 lymph nodes negative, ER 95%, PR 2%, HER2 equal vocal, FISH negative, Ki-67 1%, RCB class II     CHIEF COMPLIANT: Follow-up of right breast cancer on neoadjuvant anastrozole  INTERVAL HISTORY: Sydney Moody is a 65 y.o. with above-mentioned history of right breast cancer currently on neoadjuvant antiestrogen therapy with anastrozole. MRI Breast on 02/06/21 showed minimal non masslike enhancement within the lower right breast stable from 08/03/2020 but significantly improved since 11/23/2019, no new suspicious right breast findings, no evidence of left breast malignancy, and no abnormal lymph nodes. She presents to the clinic today for follow-up.  She is healing and recovering very well from the recent surgery.  She is very uncomfortable under the arm because of the numbing sensation.   She is also uncomfortable with the drains.  ALLERGIES:  is allergic to iodine.  MEDICATIONS:  Current Outpatient Medications  Medication Sig Dispense Refill   ALPRAZolam (XANAX) 0.5 MG tablet Take 0.5 mg by mouth 2 (two) times daily as needed for anxiety.     anastrozole (ARIMIDEX) 1 MG tablet Take 1 tablet (1 mg total) by mouth daily. (Patient taking differently: Take 1 mg by mouth at bedtime. At 8 pm) 90 tablet 3   cyclobenzaprine (FLEXERIL) 10 MG tablet Take 10 mg by mouth daily as needed for muscle spasms.     ferrous sulfate 325 (65 FE) MG tablet Take 325 mg by mouth daily with breakfast.     HYDROcodone-acetaminophen (NORCO/VICODIN) 5-325 MG tablet Take 1-2 tablets by mouth every 6 (six) hours as needed for moderate pain or severe pain. 10 tablet 0   ibuprofen (ADVIL,MOTRIN) 200 MG tablet Take 600 mg by mouth every 6 (six) hours as needed for mild pain.     levocetirizine (XYZAL) 5 MG tablet Take 5 mg by mouth daily as needed for allergies.     Levothyroxine Sodium (LEVOTHROID PO) Take 50 mcg by mouth daily.     metroNIDAZOLE (METROCREAM) 0.75 % cream Apply 1 application topically 2 (two) times daily as needed (facial rash).     Multiple Vitamin (MULTIVITAMIN) capsule Take 1 capsule by mouth daily.     NON FORMULARY Take 1 tablet by mouth daily. Bio-dent for bone health     Omega-3 1000 MG CAPS Take 1,000 mg by mouth daily.     No current facility-administered medications for this visit.  PHYSICAL EXAMINATION: ECOG PERFORMANCE STATUS: 1 - Symptomatic but completely ambulatory  Vitals:   05/02/21 1528  BP: (!) 155/76  Pulse: 85  Resp: 17  Temp: 97.7 F (36.5 C)  SpO2: 100%   Filed Weights   05/02/21 1528  Weight: 149 lb 4.8 oz (67.7 kg)      LABORATORY DATA:  I have reviewed the data as listed CMP Latest Ref Rng & Units 04/23/2021 04/24/2009  Glucose 70 - 99 mg/dL 99 85  BUN 8 - 23 mg/dL 13 16  Creatinine 0.44 - 1.00 mg/dL 0.80 1.0  Sodium 135 - 145 mmol/L 137 144   Potassium 3.5 - 5.1 mmol/L 4.1 4.6  Chloride 98 - 111 mmol/L 103 106  CO2 22 - 32 mmol/L 28 31  Calcium 8.9 - 10.3 mg/dL 10.5(H) 9.4    Lab Results  Component Value Date   WBC 6.1 04/23/2021   HGB 12.5 04/23/2021   HCT 38.3 04/23/2021   MCV 94.6 04/23/2021   PLT 287 04/23/2021   NEUTROABS 4.2 05/14/2009    ASSESSMENT & PLAN:  Malignant neoplasm of lower-inner quadrant of right breast of female, estrogen receptor positive ([place]) 11/02/2019:Screening mammogram detected a right breast distortion. Diagnostic mammogram showed a 1.8cm right breast mass, 4:00 position, with abnormal tissue surrounding the mass measuring a total of 4.9cm, no right axillary adenopathy. Biopsy showed invasive mammary carcinoma with lobular features, grade 2, HER-2 - (1+), ER+ 95%, PR+ 2%, KI67 5%.   Neoadjuvant antiestrogen therapy with anastrozole for 6 months  04/25/2021:Right mastectomy: Grade 2 ILC 5.2 cm, 0/3 lymph nodes negative, ER 95%, PR 2%, HER2 equal vocal, FISH negative, Ki-67 1%, RCB class II  Pathology counseling: I discussed the final pathology report of the patient provided  a copy of this report. I discussed the margins.  We also discussed the final staging along with previously performed ER/PR testing.  Treatment plan: Oncotype DX testing to determine if she would need systemic chemotherapy (patient feels that she probably cannot handle chemotherapy but wants to know her risks and benefits) 2.  Adjuvant radiation therapy 3. followed by adjuvant antiestrogen therapy  Oncotype counseling: I discussed Oncotype DX test. I explained to the patient that this is a 21 gene panel to evaluate patient tumors DNA to calculate recurrence score. This would help determine whether patient has high risk or intermediate risk or low risk breast cancer. She understands that if her tumor was found to be high risk, she would benefit from systemic chemotherapy. If low risk, no need of chemotherapy. If she was found to  be intermediate risk, we would need to evaluate the score as well as other risk factors and determine if an abbreviated chemotherapy may be of benefit.  Return to clinic based on Oncotype test result.    No orders of the defined types were placed in this encounter.  The patient has a good understanding of the overall plan. she agrees with it. she will call with any problems that may develop before the next visit here.  Total time spent: 30 mins including face to face time and time spent for planning, charting and coordination of care  Rulon Eisenmenger, MD, MPH 05/02/2021  I, Thana Ates, am acting as scribe for Dr. Nicholas Lose.  I have reviewed the above documentation for accuracy and completeness, and I agree with the above.

## 2021-05-02 ENCOUNTER — Other Ambulatory Visit: Payer: Self-pay

## 2021-05-02 ENCOUNTER — Inpatient Hospital Stay: Payer: 59 | Attending: Hematology and Oncology | Admitting: Hematology and Oncology

## 2021-05-02 DIAGNOSIS — Z170 Estrogen receptor positive status [ER+]: Secondary | ICD-10-CM | POA: Insufficient documentation

## 2021-05-02 DIAGNOSIS — Z79811 Long term (current) use of aromatase inhibitors: Secondary | ICD-10-CM | POA: Diagnosis not present

## 2021-05-02 DIAGNOSIS — C50311 Malignant neoplasm of lower-inner quadrant of right female breast: Secondary | ICD-10-CM | POA: Insufficient documentation

## 2021-05-02 NOTE — Assessment & Plan Note (Signed)
11/02/2019:Screening mammogram detected a right breast distortion. Diagnostic mammogram showed a 1.8cm right breast mass, 4:00 position, with abnormal tissue surrounding the mass measuring a total of 4.9cm, no right axillary adenopathy. Biopsy showed invasive mammary carcinoma with lobular features, grade 2, HER-2 - (1+), ER+ 95%, PR+ 2%, KI67 5%.   Neoadjuvant antiestrogen therapy with anastrozole for 6 months  04/25/2021:Right mastectomy: Grade 2 ILC 5.2 cm, 0/3 lymph nodes negative, ER 95%, PR 2%, HER2 equal vocal, FISH negative, Ki-67 1%, RCB class II  Pathology counseling: I discussed the final pathology report of the patient provided  a copy of this report. I discussed the margins.  We also discussed the final staging along with previously performed ER/PR testing.  Treatment plan: 1.  Adjuvant radiation therapy 2. followed by adjuvant antiestrogen therapy  Return to clinic after radiation is complete

## 2021-05-03 ENCOUNTER — Telehealth: Payer: Self-pay | Admitting: *Deleted

## 2021-05-03 ENCOUNTER — Ambulatory Visit: Payer: 59 | Admitting: Hematology and Oncology

## 2021-05-03 ENCOUNTER — Encounter: Payer: Self-pay | Admitting: *Deleted

## 2021-05-03 NOTE — Telephone Encounter (Signed)
Received order for oncotype testing. Requisition faxed to pathology and [facility] °

## 2021-05-14 LAB — SURGICAL PATHOLOGY

## 2021-05-17 ENCOUNTER — Encounter: Payer: Self-pay | Admitting: *Deleted

## 2021-05-17 ENCOUNTER — Telehealth: Payer: Self-pay | Admitting: *Deleted

## 2021-05-17 DIAGNOSIS — Z170 Estrogen receptor positive status [ER+]: Secondary | ICD-10-CM

## 2021-05-17 NOTE — Telephone Encounter (Signed)
Received oncotype score of 16. Physician team notified. Called pt with results, discussed chemo not recommended. Informed next step is xrt with Dr. Sondra Come. Received verbal understanding. Encourage pt to call with further needs or questions.  Referral placed for Dr. Sondra Come.

## 2021-05-20 ENCOUNTER — Encounter: Payer: Self-pay | Admitting: *Deleted

## 2021-05-20 NOTE — Progress Notes (Signed)
Radiation Oncology         [phone] ________________________________  Name: Sydney Moody MRN: [id number]  Date: 05/22/2021  DOB: 1956/05/31  Re-Evaluation Note  CC: Kristen Loader, FNP  Nicholas Lose, MD    ICD-10-CM   1. Malignant neoplasm of lower-inner quadrant of right breast of female, estrogen receptor positive ([place])  C50.311    Z17.0       Diagnosis:   Stage IA (cT1c, cN0, cM0) Right Breast LIQ, Invasive Lobular Carcinoma, ER+ / PR+ / Her2-, Grade 2 (ypT3, ypN0)  Pathologic Stage Classification (pTNM, AJCC 8th Edition): ypT3, ypN0  Narrative:  The patient returns today to discuss radiation treatment options. She was seen for consultation on 11/10/19.  She opted to proceed with right breast mastectomy with sentinel lymph node biopsy on 04/25/21. Pathology from the procedure revealed: grade 2 invasive lobular carcinoma spanning 5.2 cm; lobular neoplasia (atypical lobular hyperplasia); with invasive carcinoma 1 mm from the posterior margin focally. Right axillary sentinel node excisions x3 were all negative for carcinoma. Prognostic indicators significant for ER: 95%, positive, PR: 2%, positive, both with strong staining intensity, Her2: negative, Ki-67: 1%.   Oncotype DX was obtained on the final surgical sample and the recurrence score of 16 predicts a risk of recurrence outside the breast over the next 9 years of 4%, if the patient's only systemic therapy is an antiestrogen for 5 years.  It also predicts no significant benefit from chemotherapy.  Pertinent imaging since the patient was last seen for her initial consultation includes:  --bilateral breast MRI on 08/03/20 demonstrating mild residual non mass enhancement in the lower inner quadrant of the right breast, and extending into the lower outer quadrant at the location of the previously demonstrated 6.2 cm mass, to measure 5.4 cm in the greatest dimension.  --bilateral breast MRI on 02/06/21 demonstrated a minimal  non-mass like enhancement within the lower right breast; noted to be stable since imaging on 08/03/20. Otherwise, no new suspicious right breast findings, evidence of left breast malignancy, or abnormal lymph nodes were seen.   On review of systems, the patient reports ongoing anxiety issues. She denies swelling in her arm or hand.    Allergies:  is allergic to iodine.  Meds: Current Outpatient Medications  Medication Sig Dispense Refill   ALPRAZolam (XANAX) 0.5 MG tablet Take 0.5 mg by mouth 2 (two) times daily as needed for anxiety.     anastrozole (ARIMIDEX) 1 MG tablet Take 1 tablet (1 mg total) by mouth daily. (Patient taking differently: Take 1 mg by mouth at bedtime. At 8 pm) 90 tablet 3   cyclobenzaprine (FLEXERIL) 10 MG tablet Take 10 mg by mouth daily as needed for muscle spasms.     ferrous sulfate 325 (65 FE) MG tablet Take 325 mg by mouth daily with breakfast.     ibuprofen (ADVIL,MOTRIN) 200 MG tablet Take 600 mg by mouth every 6 (six) hours as needed for mild pain.     Levothyroxine Sodium (LEVOTHROID PO) Take 50 mcg by mouth daily.     metroNIDAZOLE (METROCREAM) 0.75 % cream Apply 1 application topically 2 (two) times daily as needed (facial rash).     Multiple Vitamin (MULTIVITAMIN) capsule Take 1 capsule by mouth daily.     NON FORMULARY Take 1 tablet by mouth daily. Bio-dent for bone health     Omega-3 1000 MG CAPS Take 1,000 mg by mouth daily.     VITAMIN D PO Take 1 tablet by mouth once a  week.     HYDROcodone-acetaminophen (NORCO/VICODIN) 5-325 MG tablet Take 1-2 tablets by mouth every 6 (six) hours as needed for moderate pain or severe pain. (Patient not taking: Reported on 05/22/2021) 10 tablet 0   levocetirizine (XYZAL) 5 MG tablet Take 5 mg by mouth daily as needed for allergies. (Patient not taking: Reported on 05/22/2021)     No current facility-administered medications for this encounter.    Physical Findings: The patient is in no acute distress. Patient is alert  and oriented.  height is 5' 6"  (1.676 m) and weight is 148 lb 4 oz (67.2 kg). Her temporal temperature is 96.7 F (35.9 C) (abnormal). Her blood pressure is 149/92 (abnormal) and her pulse is 72. Her respiration is 18 and oxygen saturation is 99%.  No significant changes. Lungs are clear to auscultation bilaterally. Heart has regular rate and rhythm. No palpable cervical, supraclavicular, or axillary adenopathy. Abdomen soft, non-tender, normal bowel sounds. Left breast: no palpable mass, nipple discharge or bleeding. Right chest wall area shows a mastectomy scar which is healing well without signs of drainage or infection.  There is no significant swelling noted in the right arm or hand.  Lab Findings: Lab Results  Component Value Date   WBC 6.1 04/23/2021   HGB 12.5 04/23/2021   HCT 38.3 04/23/2021   MCV 94.6 04/23/2021   PLT 287 04/23/2021    Radiographic Findings: NM Sentinel Node Inj-No Rpt (Breast)  Result Date: 04/25/2021 Sulfur Colloid was injected by the Nuclear Medicine Technologist for sentinel lymph node localization.    Impression:  Stage IA (cT1c, cN0, cM0) Right Breast LIQ, Invasive Lobular Carcinoma, ER+ / PR+ / Her2-, Grade 2  She was treated with neoadjuvant hormonal therapy.  She had minimal response with this treatment.  She did require a mastectomy.  Given the size of the tumor prior to treatment as well as after neoadjuvant therapy,  I do feel she is at risk for recurrence along the right chest wall area and would recommend postmastectomy radiation therapy.  We discussed potential elective treatment of the axillary area with the potential benefit and risk of treatment to this area.  She does not wish to consider elective coverage of axilla given the lack of initial lymph node involvement in this region.  Plan: She was originally scheduled for CT simulation today however the patient does not feel she is ready to proceed with her planning session.  We will schedule this  at a later date with treatments to begin approximately a 6 weeks postop.  Anticipate 6 weeks of postmastectomy radiation therapy directed at the right chest wall area.    -----------------------------------  Blair Promise, PhD, MD  This document serves as a record of services personally performed by Gery Pray, MD. It was created on his behalf by Roney Mans, a trained medical scribe. The creation of this record is based on the scribe's personal observations and the provider's statements to them. This document has been checked and approved by the attending provider.

## 2021-05-20 NOTE — Progress Notes (Addendum)
Location of Breast Cancer: right breast  Histology per Pathology Report:    Receptor Status:  04/25/2021  Right mastectomy: Grade 2 ILC 5.2 cm, 0/3 lymph nodes negative, ER 95%, PR 2%, HER2 equal vocal, FISH negative, Ki-67 1%, RCB class II   Did patient present with symptoms (if so, please note symptoms) or was this found on screening mammography?: routine screening mammography on 10/05/2019 showing a possible abnormality in the right breast  Past/Anticipated interventions by surgeon, if any: 04/25/2021  PROCEDURE:  Procedure(s): RIGHT MASTECTOMY WITH SENTINEL LYMPH NODE BIOPSY (Right)   SURGEON:  Surgeon(s) and Role:    Jovita Kussmaul, MD - Primary  Past/Anticipated interventions by medical oncology, if any: Dr Lindi Adie  Lymphedema issues, if any:  no    Pain issues, if any:  no   SAFETY ISSUES: Prior radiation? no Pacemaker/ICD? no Possible current pregnancy? no, postmenopausal Is the patient on methotrexate? no  Current Complaints / other details:  occasional headache, anxiety induced queasiness.    Vitals:   05/22/21 1031  BP: (!) 149/92  Pulse: 72  Resp: 18  Temp: (!) 96.7 F (35.9 C)  TempSrc: Temporal  SpO2: 99%  Weight: 148 lb 4 oz (67.2 kg)  Height: 5' 6"  (1.676 m)

## 2021-05-21 ENCOUNTER — Encounter (HOSPITAL_COMMUNITY): Payer: Self-pay

## 2021-05-22 ENCOUNTER — Ambulatory Visit
Admission: RE | Admit: 2021-05-22 | Discharge: 2021-05-22 | Disposition: A | Discharge status: Home / Self Care | Payer: 59 | Source: Ambulatory Visit | Attending: Radiation Oncology | Admitting: Radiation Oncology

## 2021-05-22 ENCOUNTER — Other Ambulatory Visit: Payer: Self-pay

## 2021-05-22 ENCOUNTER — Encounter: Payer: Self-pay | Admitting: Radiation Oncology

## 2021-05-22 ENCOUNTER — Ambulatory Visit: Payer: 59 | Admitting: Radiation Oncology

## 2021-05-22 VITALS — BP 149/92 | HR 72 | Temp 96.7°F | Resp 18 | Ht 66.0 in | Wt 148.2 lb

## 2021-05-22 DIAGNOSIS — Z79811 Long term (current) use of aromatase inhibitors: Secondary | ICD-10-CM | POA: Insufficient documentation

## 2021-05-22 DIAGNOSIS — Z170 Estrogen receptor positive status [ER+]: Secondary | ICD-10-CM | POA: Diagnosis not present

## 2021-05-22 DIAGNOSIS — Z9011 Acquired absence of right breast and nipple: Secondary | ICD-10-CM | POA: Insufficient documentation

## 2021-05-22 DIAGNOSIS — C50311 Malignant neoplasm of lower-inner quadrant of right female breast: Secondary | ICD-10-CM | POA: Insufficient documentation

## 2021-05-22 DIAGNOSIS — Z79899 Other long term (current) drug therapy: Secondary | ICD-10-CM | POA: Diagnosis not present

## 2021-05-22 NOTE — Progress Notes (Signed)
See MD note for nursing evaluation. °

## 2021-05-23 ENCOUNTER — Other Ambulatory Visit: Payer: Self-pay | Admitting: Radiation Oncology

## 2021-05-23 DIAGNOSIS — Z170 Estrogen receptor positive status [ER+]: Secondary | ICD-10-CM

## 2021-05-27 ENCOUNTER — Encounter: Payer: Self-pay | Admitting: *Deleted

## 2021-06-04 ENCOUNTER — Telehealth: Payer: Self-pay | Admitting: Radiology

## 2021-06-04 ENCOUNTER — Encounter: Payer: Self-pay | Admitting: *Deleted

## 2021-06-04 NOTE — Telephone Encounter (Signed)
Patient would like to schedule simulation for radiation therapy.

## 2021-06-05 ENCOUNTER — Encounter: Payer: Self-pay | Admitting: Hematology and Oncology

## 2021-06-05 ENCOUNTER — Encounter: Payer: Self-pay | Admitting: *Deleted

## 2021-06-06 ENCOUNTER — Other Ambulatory Visit: Payer: Self-pay

## 2021-06-06 ENCOUNTER — Ambulatory Visit
Admission: RE | Admit: 2021-06-06 | Discharge: 2021-06-06 | Disposition: A | Discharge status: Home / Self Care | Payer: 59 | Source: Ambulatory Visit | Attending: Radiation Oncology | Admitting: Radiation Oncology

## 2021-06-06 ENCOUNTER — Telehealth: Payer: Self-pay | Admitting: Hematology and Oncology

## 2021-06-06 DIAGNOSIS — Z170 Estrogen receptor positive status [ER+]: Secondary | ICD-10-CM | POA: Insufficient documentation

## 2021-06-06 DIAGNOSIS — Z510 Encounter for antineoplastic radiation therapy: Secondary | ICD-10-CM | POA: Diagnosis present

## 2021-06-06 DIAGNOSIS — C50311 Malignant neoplasm of lower-inner quadrant of right female breast: Secondary | ICD-10-CM | POA: Insufficient documentation

## 2021-06-06 NOTE — Telephone Encounter (Signed)
Scheduled appt per 8/17 sch msg. Called pt, no answer. Left msg with appt date and time.

## 2021-06-11 DIAGNOSIS — Z510 Encounter for antineoplastic radiation therapy: Secondary | ICD-10-CM | POA: Diagnosis not present

## 2021-06-12 ENCOUNTER — Other Ambulatory Visit: Payer: Self-pay

## 2021-06-12 ENCOUNTER — Ambulatory Visit: Payer: 59 | Attending: Radiation Oncology | Admitting: Physical Therapy

## 2021-06-12 ENCOUNTER — Encounter: Payer: Self-pay | Admitting: Physical Therapy

## 2021-06-12 DIAGNOSIS — R293 Abnormal posture: Secondary | ICD-10-CM | POA: Insufficient documentation

## 2021-06-12 DIAGNOSIS — Z483 Aftercare following surgery for neoplasm: Secondary | ICD-10-CM | POA: Insufficient documentation

## 2021-06-12 DIAGNOSIS — Z170 Estrogen receptor positive status [ER+]: Secondary | ICD-10-CM | POA: Insufficient documentation

## 2021-06-12 DIAGNOSIS — C50311 Malignant neoplasm of lower-inner quadrant of right female breast: Secondary | ICD-10-CM | POA: Diagnosis present

## 2021-06-12 NOTE — Therapy (Signed)
[place], [place], [address] Phone: [phone]   Fax:  [phone]  Physical Therapy Treatment  Patient Details  Name: Sydney Moody MRN: [id number] Date of Birth: 04/19/1956 Referring Provider (PT): Dr Sondra Come   Encounter Date: 06/12/2021   PT End of Session - 06/12/21 1633     Visit Number 1    Number of Visits 1    PT Start Time 1500    PT Stop Time 5638    PT Time Calculation (min) 55 min    Activity Tolerance Patient tolerated treatment well    Behavior During Therapy [facility] for tasks assessed/performed             Past Medical History:  Diagnosis Date   Arthritis    Cancer ([place])    Elevated LDL cholesterol level 05/28/2018   Osteoporosis 05/28/2018   Per [facility] records from 03/2016   Panic disorder     Past Surgical History:  Procedure Laterality Date   MANDIBLE FRACTURE SURGERY     MASTECTOMY W/ SENTINEL NODE BIOPSY Right 04/25/2021   Procedure: RIGHT MASTECTOMY WITH SENTINEL LYMPH NODE BIOPSY;  Surgeon: Jovita Kussmaul, MD;  Location: [facility];  Service: General;  Laterality: Right;   RHINOPLASTY      There were no vitals filed for this visit.   Subjective Assessment - 06/12/21 1522     Subjective Pt had her radiation simulation last week and plans to start radiation tomorrow. She says did well after surgery though she still has some pain at ribs. She has a cord in axilla and feels as cord down into her abdomen. She goes to a neuromuscular therapist for soft tissue work and a Restaurant manager, fast food once a week    Pertinent History right breast cancer diagnosed 11/10/2019 treated with neoadjuvant anastrozole.  ER/PR +, HER2-, Ki 67 1%, right mastectomy 04/25/2021 0/3 nodes positive ( Dr. Marlou Starks) Will have radiation    Patient Stated Goals to get a SOZO    Currently in Pain? No/denies                [facility] PT Assessment - 06/12/21 0001       Assessment   Medical Diagnosis right breast  cancer    Referring Provider (PT) Dr Sondra Come    Onset Date/Surgical Date 11/20/19    Hand Dominance Right      Restrictions   Weight Bearing Restrictions No      Balance Screen   Has the patient fallen in the past 6 months No    Has the patient had a decrease in activity level because of a fear of falling?  No    Is the patient reluctant to leave their home because of a fear of falling?  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  She has full area at medial incision near sternum and feels fullness at lateral chest. She has a visible thick  cord in axilla to upper arm that she has been massaging and stretching.  She also feels one in abdomen that is not visible at this time  Pt usually wears a [place] compression bra      Posture/Postural Control   Posture/Postural Control Postural limitations    Postural Limitations Rounded Shoulders;Forward head      ROM / Strength   AROM / PROM / Strength AROM      AROM   Overall AROM  Within functional limits for tasks performed    AROM Assessment Site Shoulder    Right/Left Shoulder Right;Left    Right Shoulder Flexion 175 Degrees    Right Shoulder ABduction 175 Degrees    Left Shoulder Flexion 175 Degrees    Left Shoulder ABduction 175 Degrees      Palpation   Palpation comment cording feels soft and moveable               LYMPHEDEMA/ONCOLOGY QUESTIONNAIRE - 06/12/21 0001       Type   Cancer Type right breast cancer      Surgeries   Mastectomy Date  04/25/21    Number Lymph Nodes Removed 3      Treatment   Active Chemotherapy Treatment No    Past Chemotherapy Treatment No    Active Radiation Treatment Yes    Date 06/13/21    Body Site right upper quadrant    Current Hormone Treatment Yes    Drug Name anastrozole      What other symptoms do you have   Are you Having Heaviness or Tightness No    Are you having Pain No    Are you having pitting edema No    Is it Hard or Difficult finding clothes that fit No    Do you have infections No    Is there Decreased scar mobility --   did not test   Stemmer Sign No      Lymphedema Assessments   Lymphedema Assessments Upper extremities      Right Upper Extremity Lymphedema   15 cm Proximal to Olecranon Process 30 cm    Olecranon Process 24 cm    15 cm Proximal to Ulnar Styloid Process 23 cm    Just Proximal to Ulnar Styloid Process 16 cm    Across Hand at [employer] 18.5 cm    At [place] of 2nd Digit 6 cm      Left Upper Extremity Lymphedema   15 cm Proximal to Olecranon Process 30 cm    Olecranon Process 24 cm    15 cm Proximal to Ulnar Styloid Process 22.7 cm    Just Proximal to Ulnar Styloid Process 15.5 cm    Across Hand at [employer] 18 cm    At [place] of 2nd Digit 6 cm             L-DEX FLOWSHEETS - 06/12/21 1600       L-DEX LYMPHEDEMA SCREENING   Measurement Type Unilateral    L-DEX MEASUREMENT EXTREMITY Upper Extremity    POSITION  Standing    DOMINANT SIDE Right    At Risk Side Right    BASELINE SCORE (UNILATERAL) 0.6                       [facility] Adult PT Treatment/Exercise - 06/12/21 0001       Self-Care   Self-Care Other Self-Care Comments    Other Self-Care Comments  Pt given  a patch of gray and white foam to wear inside bra at fullness around incision . Pt given information about ABC class and to add resistance exercises starting low and progressing slow                          Breast Clinic Goals - 06/12/21 1637        Patient will be able to verbalize understanding of pertinent lymphedema risk reduction practices relevant to her diagnosis specifically related to skin care.   Status Achieved      Patient will be able to verbalize understanding of the importance of attending the postoperative After Breast Cancer Class for further lymphedema risk reduction education and therapeutic exercise.   Status Achieved                  Plan - 06/12/21 1633     Clinical Impression Statement Pt is doing very well postmastectomy and should be able to tolerate radiation without difficulty.  She has mild fullness at incision and was given a patch to wear inside her compression bra.  Pt encouraged to continue her stretching and exercise and to start low and progress slow when adding resistance exercises. Initial SOZO was taken today and she will be followed up with 3 month surveillance for lymphedema but no skilled PT follow up is needed at this time    Personal Factors and Comorbidities Comorbidity 2    Comorbidities mastectomy , will have radiation    Stability/Clinical Decision Making Stable/Uncomplicated    Clinical Decision Making Low    PT Frequency One time visit    PT Treatment/Interventions Therapeutic exercise;Patient/family education    PT Next Visit Plan continue SOZO monitoring    Consulted and Agree with Plan of Care Patient            The patient was assessed using the L-Dex machine today to produce a lymphedema index baseline score. The patient will be reassessed on a regular basis (typically every 3 months) to obtain new L-Dex scores. If the score is > 6.5 points away from his/her baseline score indicating onset of subclinical lymphedema, it will be recommended to wear a compression garment for 4 weeks, 12 hours per day and then be reassessed. If the score continues to be > 6.5 points from baseline at reassessment, we will initiate lymphedema treatment. Assessing in this manner has a 95%  rate of preventing clinically significant lymphedema.  Patient will benefit from skilled therapeutic intervention in order to improve the following deficits and impairments:  Decreased knowledge of precautions, Postural dysfunction  Visit Diagnosis: Malignant neoplasm of lower-inner quadrant of right breast of female, estrogen receptor positive ([place]) - Plan: PT plan of care cert/re-cert  Aftercare following surgery for neoplasm - Plan: PT plan of care cert/re-cert  Abnormal posture - Plan: PT plan of care cert/re-cert     Problem List Patient Active Problem List   Diagnosis Date Noted   Cancer of right female breast ([place]) 04/25/2021   Malignant neoplasm of lower-inner quadrant of right breast of female, estrogen receptor positive ([place]) 11/02/2019   Osteoporosis 05/28/2018   Elevated LDL cholesterol level 05/28/2018   Panic disorder    SINUSITIS- ACUTE-NOS 10/17/2010   RECTAL BLEEDING 05/14/2009   Anxiety state 04/24/2009   ALLERGIC RHINITIS 04/24/2009   POSTMENOPAUSAL SYNDROME 04/24/2009   ARTHRALGIA 04/24/2009   MUSCLE PAIN 04/24/2009   FATIGUE 04/24/2009   FRACTURE, JAW, ANGLE 04/24/2009   RHINOPLASTY, HX  OF 04/24/2009   Donato Heinz. Owens Shark PT  Norwood Levo 06/12/2021, 4:42 PM  [place], [place], [address] Phone: [phone]   Fax:  [phone]  Name: Sydney Moody MRN: [id number] Date of Birth: September 24, 1956

## 2021-06-13 ENCOUNTER — Ambulatory Visit
Admission: RE | Admit: 2021-06-13 | Discharge: 2021-06-13 | Disposition: A | Discharge status: Home / Self Care | Payer: 59 | Source: Ambulatory Visit | Attending: Radiation Oncology | Admitting: Radiation Oncology

## 2021-06-13 ENCOUNTER — Other Ambulatory Visit: Payer: Self-pay

## 2021-06-13 DIAGNOSIS — Z510 Encounter for antineoplastic radiation therapy: Secondary | ICD-10-CM | POA: Diagnosis not present

## 2021-06-13 DIAGNOSIS — C50911 Malignant neoplasm of unspecified site of right female breast: Secondary | ICD-10-CM

## 2021-06-14 ENCOUNTER — Other Ambulatory Visit: Payer: Self-pay

## 2021-06-14 ENCOUNTER — Ambulatory Visit
Admission: RE | Admit: 2021-06-14 | Discharge: 2021-06-14 | Disposition: A | Discharge status: Home / Self Care | Payer: 59 | Source: Ambulatory Visit | Attending: Radiation Oncology | Admitting: Radiation Oncology

## 2021-06-14 DIAGNOSIS — Z510 Encounter for antineoplastic radiation therapy: Secondary | ICD-10-CM | POA: Diagnosis not present

## 2021-06-17 ENCOUNTER — Ambulatory Visit
Admission: RE | Admit: 2021-06-17 | Discharge: 2021-06-17 | Disposition: A | Discharge status: Home / Self Care | Payer: 59 | Source: Ambulatory Visit | Attending: Radiation Oncology | Admitting: Radiation Oncology

## 2021-06-17 ENCOUNTER — Other Ambulatory Visit: Payer: Self-pay

## 2021-06-17 DIAGNOSIS — Z510 Encounter for antineoplastic radiation therapy: Secondary | ICD-10-CM | POA: Diagnosis not present

## 2021-06-18 ENCOUNTER — Ambulatory Visit
Admission: RE | Admit: 2021-06-18 | Discharge: 2021-06-18 | Disposition: A | Discharge status: Home / Self Care | Payer: 59 | Source: Ambulatory Visit | Attending: Radiation Oncology | Admitting: Radiation Oncology

## 2021-06-18 DIAGNOSIS — C50311 Malignant neoplasm of lower-inner quadrant of right female breast: Secondary | ICD-10-CM

## 2021-06-18 DIAGNOSIS — Z510 Encounter for antineoplastic radiation therapy: Secondary | ICD-10-CM | POA: Diagnosis not present

## 2021-06-18 MED ORDER — RADIAPLEXRX EX GEL
1.0000 "application " | Freq: Once | CUTANEOUS | Status: AC
  Administered 2021-06-18: 1 via TOPICAL

## 2021-06-18 MED ORDER — ALRA NON-METALLIC DEODORANT (RAD-ONC)
1.0000 "application " | Freq: Once | TOPICAL | Status: AC
  Administered 2021-06-18: 1 via TOPICAL

## 2021-06-19 ENCOUNTER — Ambulatory Visit
Admission: RE | Admit: 2021-06-19 | Discharge: 2021-06-19 | Disposition: A | Discharge status: Home / Self Care | Payer: 59 | Source: Ambulatory Visit | Attending: Radiation Oncology | Admitting: Radiation Oncology

## 2021-06-19 ENCOUNTER — Other Ambulatory Visit: Payer: Self-pay

## 2021-06-19 DIAGNOSIS — Z510 Encounter for antineoplastic radiation therapy: Secondary | ICD-10-CM | POA: Diagnosis not present

## 2021-06-20 ENCOUNTER — Ambulatory Visit
Admission: RE | Admit: 2021-06-20 | Discharge: 2021-06-20 | Disposition: A | Discharge status: Home / Self Care | Payer: 59 | Source: Ambulatory Visit | Attending: Radiation Oncology | Admitting: Radiation Oncology

## 2021-06-20 DIAGNOSIS — Z170 Estrogen receptor positive status [ER+]: Secondary | ICD-10-CM | POA: Diagnosis present

## 2021-06-20 DIAGNOSIS — C50311 Malignant neoplasm of lower-inner quadrant of right female breast: Secondary | ICD-10-CM | POA: Diagnosis present

## 2021-06-20 DIAGNOSIS — Z510 Encounter for antineoplastic radiation therapy: Secondary | ICD-10-CM | POA: Diagnosis not present

## 2021-06-21 ENCOUNTER — Ambulatory Visit
Admission: RE | Admit: 2021-06-21 | Discharge: 2021-06-21 | Disposition: A | Discharge status: Home / Self Care | Payer: 59 | Source: Ambulatory Visit | Attending: Radiation Oncology | Admitting: Radiation Oncology

## 2021-06-21 ENCOUNTER — Other Ambulatory Visit: Payer: Self-pay

## 2021-06-21 DIAGNOSIS — Z510 Encounter for antineoplastic radiation therapy: Secondary | ICD-10-CM | POA: Diagnosis not present

## 2021-06-25 ENCOUNTER — Other Ambulatory Visit: Payer: Self-pay

## 2021-06-25 ENCOUNTER — Ambulatory Visit
Admission: RE | Admit: 2021-06-25 | Discharge: 2021-06-25 | Disposition: A | Discharge status: Home / Self Care | Payer: 59 | Source: Ambulatory Visit | Attending: Radiation Oncology | Admitting: Radiation Oncology

## 2021-06-25 DIAGNOSIS — Z510 Encounter for antineoplastic radiation therapy: Secondary | ICD-10-CM | POA: Diagnosis not present

## 2021-06-26 ENCOUNTER — Ambulatory Visit
Admission: RE | Admit: 2021-06-26 | Discharge: 2021-06-26 | Disposition: A | Discharge status: Home / Self Care | Payer: 59 | Source: Ambulatory Visit | Attending: Radiation Oncology | Admitting: Radiation Oncology

## 2021-06-26 DIAGNOSIS — Z510 Encounter for antineoplastic radiation therapy: Secondary | ICD-10-CM | POA: Diagnosis not present

## 2021-06-27 ENCOUNTER — Ambulatory Visit
Admission: RE | Admit: 2021-06-27 | Discharge: 2021-06-27 | Disposition: A | Discharge status: Home / Self Care | Payer: 59 | Source: Ambulatory Visit | Attending: Radiation Oncology | Admitting: Radiation Oncology

## 2021-06-27 ENCOUNTER — Other Ambulatory Visit: Payer: Self-pay

## 2021-06-27 DIAGNOSIS — Z510 Encounter for antineoplastic radiation therapy: Secondary | ICD-10-CM | POA: Diagnosis not present

## 2021-06-28 ENCOUNTER — Other Ambulatory Visit: Payer: Self-pay

## 2021-06-28 ENCOUNTER — Ambulatory Visit
Admission: RE | Admit: 2021-06-28 | Discharge: 2021-06-28 | Disposition: A | Discharge status: Home / Self Care | Payer: 59 | Source: Ambulatory Visit | Attending: Radiation Oncology | Admitting: Radiation Oncology

## 2021-06-28 DIAGNOSIS — Z510 Encounter for antineoplastic radiation therapy: Secondary | ICD-10-CM | POA: Diagnosis not present

## 2021-07-01 ENCOUNTER — Other Ambulatory Visit: Payer: Self-pay

## 2021-07-01 ENCOUNTER — Encounter: Payer: Self-pay | Admitting: *Deleted

## 2021-07-01 ENCOUNTER — Ambulatory Visit
Admission: RE | Admit: 2021-07-01 | Discharge: 2021-07-01 | Disposition: A | Discharge status: Home / Self Care | Payer: 59 | Source: Ambulatory Visit | Attending: Radiation Oncology | Admitting: Radiation Oncology

## 2021-07-01 DIAGNOSIS — Z510 Encounter for antineoplastic radiation therapy: Secondary | ICD-10-CM | POA: Diagnosis not present

## 2021-07-02 ENCOUNTER — Ambulatory Visit
Admission: RE | Admit: 2021-07-02 | Discharge: 2021-07-02 | Disposition: A | Discharge status: Home / Self Care | Payer: 59 | Source: Ambulatory Visit | Attending: Radiation Oncology | Admitting: Radiation Oncology

## 2021-07-02 DIAGNOSIS — Z510 Encounter for antineoplastic radiation therapy: Secondary | ICD-10-CM | POA: Diagnosis not present

## 2021-07-03 ENCOUNTER — Other Ambulatory Visit: Payer: Self-pay

## 2021-07-03 ENCOUNTER — Ambulatory Visit
Admission: RE | Admit: 2021-07-03 | Discharge: 2021-07-03 | Disposition: A | Discharge status: Home / Self Care | Payer: 59 | Source: Ambulatory Visit | Attending: Radiation Oncology | Admitting: Radiation Oncology

## 2021-07-03 DIAGNOSIS — Z510 Encounter for antineoplastic radiation therapy: Secondary | ICD-10-CM | POA: Diagnosis not present

## 2021-07-04 ENCOUNTER — Ambulatory Visit
Admission: RE | Admit: 2021-07-04 | Discharge: 2021-07-04 | Disposition: A | Discharge status: Home / Self Care | Payer: 59 | Source: Ambulatory Visit | Attending: Radiation Oncology | Admitting: Radiation Oncology

## 2021-07-04 DIAGNOSIS — Z510 Encounter for antineoplastic radiation therapy: Secondary | ICD-10-CM | POA: Diagnosis not present

## 2021-07-05 ENCOUNTER — Other Ambulatory Visit: Payer: Self-pay

## 2021-07-05 ENCOUNTER — Ambulatory Visit
Admission: RE | Admit: 2021-07-05 | Discharge: 2021-07-05 | Disposition: A | Discharge status: Home / Self Care | Payer: 59 | Source: Ambulatory Visit | Attending: Radiation Oncology | Admitting: Radiation Oncology

## 2021-07-05 DIAGNOSIS — Z510 Encounter for antineoplastic radiation therapy: Secondary | ICD-10-CM | POA: Diagnosis not present

## 2021-07-08 ENCOUNTER — Ambulatory Visit
Admission: RE | Admit: 2021-07-08 | Discharge: 2021-07-08 | Disposition: A | Discharge status: Home / Self Care | Payer: 59 | Source: Ambulatory Visit | Attending: Radiation Oncology | Admitting: Radiation Oncology

## 2021-07-08 ENCOUNTER — Other Ambulatory Visit: Payer: Self-pay

## 2021-07-08 DIAGNOSIS — Z510 Encounter for antineoplastic radiation therapy: Secondary | ICD-10-CM | POA: Diagnosis not present

## 2021-07-09 ENCOUNTER — Ambulatory Visit
Admission: RE | Admit: 2021-07-09 | Discharge: 2021-07-09 | Disposition: A | Discharge status: Home / Self Care | Payer: 59 | Source: Ambulatory Visit | Attending: Radiation Oncology | Admitting: Radiation Oncology

## 2021-07-09 ENCOUNTER — Ambulatory Visit: Payer: 59 | Admitting: Radiation Oncology

## 2021-07-09 DIAGNOSIS — Z510 Encounter for antineoplastic radiation therapy: Secondary | ICD-10-CM | POA: Diagnosis not present

## 2021-07-10 ENCOUNTER — Ambulatory Visit
Admission: RE | Admit: 2021-07-10 | Discharge: 2021-07-10 | Disposition: A | Discharge status: Home / Self Care | Payer: 59 | Source: Ambulatory Visit | Attending: Radiation Oncology | Admitting: Radiation Oncology

## 2021-07-10 ENCOUNTER — Other Ambulatory Visit: Payer: Self-pay

## 2021-07-10 DIAGNOSIS — Z510 Encounter for antineoplastic radiation therapy: Secondary | ICD-10-CM | POA: Diagnosis not present

## 2021-07-11 ENCOUNTER — Ambulatory Visit
Admission: RE | Admit: 2021-07-11 | Discharge: 2021-07-11 | Disposition: A | Discharge status: Home / Self Care | Payer: 59 | Source: Ambulatory Visit | Attending: Radiation Oncology | Admitting: Radiation Oncology

## 2021-07-11 DIAGNOSIS — Z510 Encounter for antineoplastic radiation therapy: Secondary | ICD-10-CM | POA: Diagnosis not present

## 2021-07-12 ENCOUNTER — Ambulatory Visit
Admission: RE | Admit: 2021-07-12 | Discharge: 2021-07-12 | Disposition: A | Discharge status: Home / Self Care | Payer: 59 | Source: Ambulatory Visit | Attending: Radiation Oncology | Admitting: Radiation Oncology

## 2021-07-12 ENCOUNTER — Other Ambulatory Visit: Payer: Self-pay

## 2021-07-12 DIAGNOSIS — Z510 Encounter for antineoplastic radiation therapy: Secondary | ICD-10-CM | POA: Diagnosis not present

## 2021-07-15 ENCOUNTER — Other Ambulatory Visit: Payer: Self-pay

## 2021-07-15 ENCOUNTER — Ambulatory Visit
Admission: RE | Admit: 2021-07-15 | Discharge: 2021-07-15 | Disposition: A | Discharge status: Home / Self Care | Payer: 59 | Source: Ambulatory Visit | Attending: Radiation Oncology | Admitting: Radiation Oncology

## 2021-07-15 DIAGNOSIS — Z510 Encounter for antineoplastic radiation therapy: Secondary | ICD-10-CM | POA: Diagnosis not present

## 2021-07-16 ENCOUNTER — Ambulatory Visit
Admission: RE | Admit: 2021-07-16 | Discharge: 2021-07-16 | Disposition: A | Discharge status: Home / Self Care | Payer: 59 | Source: Ambulatory Visit | Attending: Radiation Oncology | Admitting: Radiation Oncology

## 2021-07-16 ENCOUNTER — Other Ambulatory Visit: Payer: Self-pay

## 2021-07-16 DIAGNOSIS — Z510 Encounter for antineoplastic radiation therapy: Secondary | ICD-10-CM | POA: Diagnosis not present

## 2021-07-16 DIAGNOSIS — C50311 Malignant neoplasm of lower-inner quadrant of right female breast: Secondary | ICD-10-CM

## 2021-07-16 DIAGNOSIS — Z170 Estrogen receptor positive status [ER+]: Secondary | ICD-10-CM

## 2021-07-17 ENCOUNTER — Ambulatory Visit
Admission: RE | Admit: 2021-07-17 | Discharge: 2021-07-17 | Disposition: A | Discharge status: Home / Self Care | Payer: 59 | Source: Ambulatory Visit | Attending: Radiation Oncology | Admitting: Radiation Oncology

## 2021-07-17 DIAGNOSIS — Z510 Encounter for antineoplastic radiation therapy: Secondary | ICD-10-CM | POA: Diagnosis not present

## 2021-07-18 ENCOUNTER — Other Ambulatory Visit: Payer: Self-pay

## 2021-07-18 ENCOUNTER — Ambulatory Visit
Admission: RE | Admit: 2021-07-18 | Discharge: 2021-07-18 | Disposition: A | Discharge status: Home / Self Care | Payer: 59 | Source: Ambulatory Visit | Attending: Radiation Oncology | Admitting: Radiation Oncology

## 2021-07-18 DIAGNOSIS — Z510 Encounter for antineoplastic radiation therapy: Secondary | ICD-10-CM | POA: Diagnosis not present

## 2021-07-19 ENCOUNTER — Ambulatory Visit
Admission: RE | Admit: 2021-07-19 | Discharge: 2021-07-19 | Disposition: A | Discharge status: Home / Self Care | Payer: 59 | Source: Ambulatory Visit | Attending: Radiation Oncology | Admitting: Radiation Oncology

## 2021-07-19 DIAGNOSIS — Z510 Encounter for antineoplastic radiation therapy: Secondary | ICD-10-CM | POA: Diagnosis not present

## 2021-07-22 ENCOUNTER — Ambulatory Visit: Payer: 59

## 2021-07-22 NOTE — Progress Notes (Signed)
 Patient Care Team: Brake, Andrew R, FNP as PCP - General (Family Medicine) Stuart, Dawn C, RN as Oncology Nurse Navigator Martini, Keisha N, RN as Oncology Nurse Navigator  DIAGNOSIS:    ICD-10-CM   1. Malignant neoplasm of lower-inner quadrant of right breast of female, estrogen receptor positive (HCC)  C50.311    Z17.0       SUMMARY OF ONCOLOGIC HISTORY: Oncology History  Malignant neoplasm of lower-inner quadrant of right breast of female, estrogen receptor positive (HCC)  10/27/2019 Cancer Staging   Staging form: Breast, AJCC 8th Edition - Clinical stage from 10/27/2019: Stage IA (cT1c, cN0, cM0, G2, ER+, PR+, HER2-) - Signed by Causey, Lindsey Cornetto, NP on 11/02/2019   11/02/2019 Initial Diagnosis   Screening mammogram detected a right breast distortion. Diagnostic mammogram showed a 1.8cm right breast mass, 4:00 position, with abnormal tissue surrounding the mass measuring a total of 4.9cm, no right axillary adenopathy. Biopsy showed invasive mammary carcinoma with lobular features, grade 2, HER-2 - (1+), ER+ 95%, PR+ 2%, KI67 5%.    04/25/2021 Surgery   Right mastectomy: Grade 2 ILC 5.2 cm, 0/3 lymph nodes negative, ER 95%, PR 2%, HER2 equal vocal, FISH negative, Ki-67 1%, RCB class II   05/15/2021 Oncotype testing   Oncotype DX: Score: 16, distant recurrence at 9 years: 4%     CHIEF COMPLIANT: Follow-up of right breast cancer    INTERVAL HISTORY: Sydney Moody is a 65 y.o. with above-mentioned history of right breast cancer currently on  antiestrogen therapy with anastrozole. She presents to the clinic today for follow-up.  She is finishing with radiation in a few days.  She has done extremely well from radiation with some radiation dermatitis.  She has been taking anastrozole all along and is without any problems or concerns.  ALLERGIES:  is allergic to iodine.  MEDICATIONS:  Current Outpatient Medications  Medication Sig Dispense Refill   ALPRAZolam (XANAX) 0.5 MG  tablet Take 0.5 mg by mouth 2 (two) times daily as needed for anxiety.     anastrozole (ARIMIDEX) 1 MG tablet Take 1 tablet (1 mg total) by mouth daily. (Patient taking differently: Take 1 mg by mouth at bedtime. At 8 pm) 90 tablet 3   cyclobenzaprine (FLEXERIL) 10 MG tablet Take 10 mg by mouth daily as needed for muscle spasms.     ferrous sulfate 325 (65 FE) MG tablet Take 325 mg by mouth daily with breakfast.     HYDROcodone-acetaminophen (NORCO/VICODIN) 5-325 MG tablet Take 1-2 tablets by mouth every 6 (six) hours as needed for moderate pain or severe pain. (Patient not taking: Reported on 05/22/2021) 10 tablet 0   ibuprofen (ADVIL,MOTRIN) 200 MG tablet Take 600 mg by mouth every 6 (six) hours as needed for mild pain.     levocetirizine (XYZAL) 5 MG tablet Take 5 mg by mouth daily as needed for allergies. (Patient not taking: Reported on 05/22/2021)     Levothyroxine Sodium (LEVOTHROID PO) Take 50 mcg by mouth daily.     metroNIDAZOLE (METROCREAM) 0.75 % cream Apply 1 application topically 2 (two) times daily as needed (facial rash).     Multiple Vitamin (MULTIVITAMIN) capsule Take 1 capsule by mouth daily.     NON FORMULARY Take 1 tablet by mouth daily. Bio-dent for bone health     Omega-3 1000 MG CAPS Take 1,000 mg by mouth daily.     VITAMIN D PO Take 1 tablet by mouth once a week.     No current   facility-administered medications for this visit.    PHYSICAL EXAMINATION: ECOG PERFORMANCE STATUS: 1 - Symptomatic but completely ambulatory  Vitals:   07/23/21 1416  BP: 130/68  Pulse: 81  Resp: 18  Temp: (!) 97.3 F (36.3 C)  SpO2: 97%   Filed Weights   07/23/21 1416  Weight: 152 lb 12.8 oz (69.3 kg)     LABORATORY DATA:  I have reviewed the data as listed CMP Latest Ref Rng & Units 04/23/2021 04/24/2009  Glucose 70 - 99 mg/dL 99 85  BUN 8 - 23 mg/dL 13 16  Creatinine 0.44 - 1.00 mg/dL 0.80 1.0  Sodium 135 - 145 mmol/L 137 144  Potassium 3.5 - 5.1 mmol/L 4.1 4.6  Chloride 98 - 111  mmol/L 103 106  CO2 22 - 32 mmol/L 28 31  Calcium 8.9 - 10.3 mg/dL 10.5(H) 9.4    Lab Results  Component Value Date   WBC 6.1 04/23/2021   HGB 12.5 04/23/2021   HCT 38.3 04/23/2021   MCV 94.6 04/23/2021   PLT 287 04/23/2021   NEUTROABS 4.2 05/14/2009    ASSESSMENT & PLAN:  Malignant neoplasm of lower-inner quadrant of right breast of female, estrogen receptor positive ([place]) 11/02/2019:Screening mammogram detected a right breast distortion. Diagnostic mammogram showed a 1.8cm right breast mass, 4:00 position, with abnormal tissue surrounding the mass measuring a total of 4.9cm, no right axillary adenopathy. Biopsy showed invasive mammary carcinoma with lobular features, grade 2, HER-2 - (1+), ER+ 95%, PR+ 2%, KI67 5%.    January 2021: Neoadjuvant antiestrogen therapy with anastrozole   04/25/2021:Right mastectomy: Grade 2 ILC 5.2 cm, 0/3 lymph nodes negative, ER 95%, PR 2%, HER2 equal vocal, FISH negative, Ki-67 1%, RCB class II  Oncotype DX: Score: 16, distant recurrence at 9 years: 4% Adjuvant radiation: 06/14/2021-07/26/2021  Treatment plan: Adjuvant antiestrogen therapy with Anastrozole 2.5 mg daily x7 years  Return to clinic in 3 months for survivorship care plan visit I will see her back in 1 year for follow-up.    No orders of the defined types were placed in this encounter.  The patient has a good understanding of the overall plan. she agrees with it. she will call with any problems that may develop before the next visit here.  Total time spent: 20 mins including face to face time and time spent for planning, charting and coordination of care  Rulon Eisenmenger, MD, MPH 07/23/2021  I, Thana Ates, am acting as scribe for Dr. Nicholas Lose.  I have reviewed the above documentation for accuracy and completeness, and I agree with the above.

## 2021-07-23 ENCOUNTER — Inpatient Hospital Stay: Payer: 59 | Attending: Hematology and Oncology | Admitting: Hematology and Oncology

## 2021-07-23 ENCOUNTER — Ambulatory Visit
Admission: RE | Admit: 2021-07-23 | Discharge: 2021-07-23 | Disposition: A | Discharge status: Home / Self Care | Payer: 59 | Source: Ambulatory Visit | Attending: Radiation Oncology | Admitting: Radiation Oncology

## 2021-07-23 ENCOUNTER — Other Ambulatory Visit: Payer: Self-pay

## 2021-07-23 DIAGNOSIS — Z510 Encounter for antineoplastic radiation therapy: Secondary | ICD-10-CM | POA: Insufficient documentation

## 2021-07-23 DIAGNOSIS — Z79811 Long term (current) use of aromatase inhibitors: Secondary | ICD-10-CM | POA: Insufficient documentation

## 2021-07-23 DIAGNOSIS — Z170 Estrogen receptor positive status [ER+]: Secondary | ICD-10-CM | POA: Insufficient documentation

## 2021-07-23 DIAGNOSIS — C50311 Malignant neoplasm of lower-inner quadrant of right female breast: Secondary | ICD-10-CM | POA: Insufficient documentation

## 2021-07-23 NOTE — Assessment & Plan Note (Signed)
11/02/2019:Screening mammogram detected a right breast distortion. Diagnostic mammogram showed a 1.8cm right breast mass, 4:00 position, with abnormal tissue surrounding the mass measuring a total of 4.9cm, no right axillary adenopathy. Biopsy showed invasive mammary carcinoma with lobular features, grade 2, HER-2 - (1+), ER+ 95%, PR+ 2%, KI67 5%.   Neoadjuvant antiestrogen therapy with anastrozole for 6 months  04/25/2021:Right mastectomy: Grade 2 ILC 5.2 cm, 0/3 lymph nodes negative, ER 95%, PR 2%, HER2 equal vocal, FISH negative, Ki-67 1%, RCB class II  Oncotype DX: Score: 16, distant recurrence at 9 years: 4% Adjuvant radiation: 06/14/2021-07/26/2021  Treatment plan: Adjuvant antiestrogen therapy with letrozole 2.5 mg daily x7 years Letrozole counseling: We discussed the risks and benefits of anti-estrogen therapy with aromatase inhibitors. These include but not limited to insomnia, hot flashes, mood changes, vaginal dryness, bone density loss, and weight gain. We strongly believe that the benefits far outweigh the risks. Patient understands these risks and consented to starting treatment. Planned treatment duration is 7 years.  Return to clinic in 3 months for survivorship care plan visit

## 2021-07-24 ENCOUNTER — Encounter: Payer: Self-pay | Admitting: *Deleted

## 2021-07-24 ENCOUNTER — Ambulatory Visit
Admission: RE | Admit: 2021-07-24 | Discharge: 2021-07-24 | Disposition: A | Discharge status: Home / Self Care | Payer: 59 | Source: Ambulatory Visit | Attending: Radiation Oncology | Admitting: Radiation Oncology

## 2021-07-24 DIAGNOSIS — Z510 Encounter for antineoplastic radiation therapy: Secondary | ICD-10-CM | POA: Diagnosis not present

## 2021-07-25 ENCOUNTER — Ambulatory Visit: Payer: 59

## 2021-07-25 ENCOUNTER — Ambulatory Visit
Admission: RE | Admit: 2021-07-25 | Discharge: 2021-07-25 | Disposition: A | Discharge status: Home / Self Care | Payer: 59 | Source: Ambulatory Visit | Attending: Radiation Oncology | Admitting: Radiation Oncology

## 2021-07-25 ENCOUNTER — Other Ambulatory Visit: Payer: Self-pay

## 2021-07-25 DIAGNOSIS — Z510 Encounter for antineoplastic radiation therapy: Secondary | ICD-10-CM | POA: Diagnosis not present

## 2021-07-26 ENCOUNTER — Ambulatory Visit
Admission: RE | Admit: 2021-07-26 | Discharge: 2021-07-26 | Disposition: A | Discharge status: Home / Self Care | Payer: 59 | Source: Ambulatory Visit | Attending: Radiation Oncology | Admitting: Radiation Oncology

## 2021-07-26 ENCOUNTER — Encounter: Payer: Self-pay | Admitting: Radiation Oncology

## 2021-07-26 DIAGNOSIS — Z510 Encounter for antineoplastic radiation therapy: Secondary | ICD-10-CM | POA: Diagnosis not present

## 2021-08-21 ENCOUNTER — Encounter: Payer: Self-pay | Admitting: Radiation Oncology

## 2021-08-24 NOTE — Progress Notes (Incomplete)
  Radiation Oncology         [phone] ________________________________   Radiation Oncology         [phone] ________________________________  Name: Sydney Moody MRN: [id number]  Date: 07/26/2021  DOB: 06/08/1956  End of Treatment Note  Diagnosis: Stage IA (cT1c, cN0, cM0) Right Breast LIQ, Invasive Lobular Carcinoma, ER+ / PR+ / Her2-, Grade 2 (ypT3, ypN0)   Pathologic Stage Classification (pTNM, AJCC 8th Edition): ypT3, ypN0       Radiation treatment dates:  06/13/21 through 07/26/21  Site/dose:  Right chest wall  CW_Rt - 24.00 of 24.00Gy, 12 of 12 fractions delivered - 2.00 Gy/Fx CW_Rt_BO - 26.00 of 26.00Gy, 13 of 13 fractions delivered - 2.00 Gy/Fx CW_Rt_Bst_BO - 10.00 of 10.00Gy, 5 of 5 fractions delivered - 2.00 Gy/Fx  Beams/energy:    Energy:  -- 6x for 24.00 Gy delivered in 12 fractions  --10x for 26.00 Gy delivered in 13 fractions  -- 6Mev for 10 Gy delivered in 5 fractions   Narrative: The patient tolerated radiation treatment relatively well. Patient denies pain, reports having some mild fatigue and mild swelling to right axilla. Continues to have full range of motion to right arm. Reports increased itching to right chest wall. Continues to use Radiaplex and Aloe.  On physical exam, right chest wall area shows some erythema.  No skin breakdown.  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  Plan: The patient has completed radiation treatment. The patient will return to radiation oncology clinic for routine followup in one month. I advised them to call or return sooner if they have any questions or concerns related to their recovery or treatment.  -----------------------------------  Blair Promise, PhD, MD  This document serves as a record of services personally performed by Gery Pray, MD. It was created on his behalf by Roney Mans, a trained medical scribe.  The creation of this record is based on the scribe's personal observations and the provider's statements to them. This document has been checked and approved by the attending provider.

## 2021-08-25 NOTE — Progress Notes (Signed)
Radiation Oncology         [phone] ________________________________  Name: Sydney Moody MRN: [id number]  Date: 08/26/2021  DOB: 03/12/1956  Follow-Up Visit Note  CC: Kristen Loader, FNP  Nicholas Lose, MD    ICD-10-CM   1. Malignant neoplasm of lower-inner quadrant of right breast of female, estrogen receptor positive ([place])  C50.311    Z17.0     2. Malignant neoplasm of right female breast, unspecified estrogen receptor status, unspecified site of breast ([place])  C50.911       Diagnosis:  Stage IA (cT1c, cN0, cM0) Right Breast LIQ, Invasive Lobular Carcinoma, ER+ / PR+ / Her2-, Grade 2 (ypT3, ypN0)   Pathologic Stage Classification (pTNM, AJCC 8th Edition): ypT3, ypN0      Interval Since Last Radiation: 1 month   Radiation treatment dates:  06/13/21 through 07/26/21   Site/dose:  Right chest wall  CW_Rt - 24.00 of 24.00Gy, 12 of 12 fractions delivered - 2.00 Gy/Fx CW_Rt_BO - 26.00 of 26.00Gy, 13 of 13 fractions delivered - 2.00 Gy/Fx CW_Rt_Bst_BO - 10.00 of 10.00Gy, 5 of 5 fractions delivered - 2.00 Gy/Fx (Boost starting on 07/19/21)   Beams/energy:    Energy:  -- 6x for 24.00 Gy delivered in 12 fractions  --10x for 26.00 Gy delivered in 13 fractions  -- 6Mev for 10 Gy delivered in 5 fractions   Narrative:  The patient returns today for routine follow-up. The patient tolerated radiation treatment relatively well except for some mild fatigue and mild swelling to right axilla. Patient also reported increased itching to right chest wall for which she used Radiaplex and Aloe. Physical exam performed on the day of her final treatment showed the right chest wall area with some erythema and no skin breakdown. Radiation dermatitis was also noted in the upper inner aspect of the chest wall area.   Otherwise, no significant interval history since the patient was last seen.   She continues to have fatigue but this is slowly improving.  She has some mild itching in the upper  inner aspect of the right chest wall area.  She denies any pain in this area.  She denies any swelling in her right arm or hand.  Allergies:  is allergic to iodine.  Meds: Current Outpatient Medications  Medication Sig Dispense Refill   ALPRAZolam (XANAX) 0.5 MG tablet Take 0.5 mg by mouth 2 (two) times daily as needed for anxiety.     anastrozole (ARIMIDEX) 1 MG tablet Take 1 tablet (1 mg total) by mouth daily. (Patient taking differently: Take 1 mg by mouth at bedtime. At 8 pm) 90 tablet 3   cyclobenzaprine (FLEXERIL) 10 MG tablet Take 10 mg by mouth daily as needed for muscle spasms.     ferrous sulfate 325 (65 FE) MG tablet Take 325 mg by mouth daily with breakfast.     ibuprofen (ADVIL,MOTRIN) 200 MG tablet Take 600 mg by mouth every 6 (six) hours as needed for mild pain.     levocetirizine (XYZAL) 5 MG tablet Take 5 mg by mouth daily as needed for allergies.     Levothyroxine Sodium (LEVOTHROID PO) Take 50 mcg by mouth daily.     metroNIDAZOLE (METROCREAM) 0.75 % cream Apply 1 application topically 2 (two) times daily as needed (facial rash).     Multiple Vitamin (MULTIVITAMIN) capsule Take 1 capsule by mouth daily.     NON FORMULARY Take 1 tablet by mouth daily. Bio-dent for bone health     Omega-3 1000  MG CAPS Take 1,000 mg by mouth daily.     VITAMIN D PO Take 1 tablet by mouth once a week. (Patient not taking: Reported on 08/26/2021)     No current facility-administered medications for this encounter.    Physical Findings: The patient is in no acute distress. Patient is alert and oriented.  height is 5' 6"  (1.676 m) and weight is 157 lb (71.2 kg). Her temperature is 97.7 F (36.5 C). Her blood pressure is 118/73 and her pulse is 91. Her respiration is 20 and oxygen saturation is 100%. .  No significant changes. Lungs are clear to auscultation bilaterally. Heart has regular rate and rhythm. No palpable cervical, supraclavicular, or axillary adenopathy. Abdomen soft, non-tender,  normal bowel sounds.  Left breast: no palpable mass, nipple discharge or bleeding.  Right chest wall area reveals some continued mild erythema.  Skin is well-healed at this time.  No palpable or visible signs of recurrence.  She does have some soft tissue redundancy along the medial aspect of her mastectomy scar.  This area is soft with palpation.    Lab Findings: Lab Results  Component Value Date   WBC 6.1 04/23/2021   HGB 12.5 04/23/2021   HCT 38.3 04/23/2021   MCV 94.6 04/23/2021   PLT 287 04/23/2021    Radiographic Findings: No results found.  Impression:   Stage IA (cT1c, cN0, cM0) Right Breast LIQ, Invasive Lobular Carcinoma, ER+ / PR+ / Her2-, Grade 2 (ypT3, ypN0)   Pathologic Stage Classification (pTNM, AJCC 8th Edition): ypT3, ypN0       The patient is recovering from the effects of radiation.  No evidence of recurrence on clinical exam today.  She was given additional radiaPlex to place on her skin  Plan: As needed follow-up in radiation oncology.  Patient will continue follow-up with medical oncology and general surgery.  She continues on Arimidex.    ____________________________________  Blair Promise, PhD, MD   This document serves as a record of services personally performed by Gery Pray, MD. It was created on his behalf by Roney Mans, a trained medical scribe. The creation of this record is based on the scribe's personal observations and the provider's statements to them. This document has been checked and approved by the attending provider.

## 2021-08-26 ENCOUNTER — Other Ambulatory Visit: Payer: Self-pay

## 2021-08-26 ENCOUNTER — Ambulatory Visit
Admission: RE | Admit: 2021-08-26 | Discharge: 2021-08-26 | Disposition: A | Discharge status: Home / Self Care | Payer: 59 | Source: Ambulatory Visit | Attending: Radiation Oncology | Admitting: Radiation Oncology

## 2021-08-26 ENCOUNTER — Encounter: Payer: Self-pay | Admitting: Radiation Oncology

## 2021-08-26 VITALS — BP 118/73 | HR 91 | Temp 97.7°F | Resp 20 | Ht 66.0 in | Wt 157.0 lb

## 2021-08-26 DIAGNOSIS — Z170 Estrogen receptor positive status [ER+]: Secondary | ICD-10-CM | POA: Diagnosis present

## 2021-08-26 DIAGNOSIS — C50911 Malignant neoplasm of unspecified site of right female breast: Secondary | ICD-10-CM | POA: Diagnosis present

## 2021-08-26 DIAGNOSIS — C50311 Malignant neoplasm of lower-inner quadrant of right female breast: Secondary | ICD-10-CM | POA: Insufficient documentation

## 2021-08-26 HISTORY — DX: Personal history of irradiation: Z92.3

## 2021-08-26 NOTE — Progress Notes (Signed)
Nera Haworth is here today for follow up post radiation to the breast.   Breast Side: Right chest wall   They completed their radiation on: 07/26/21  Does the patient complain of any of the following: Post radiation skin issues:  Patient reports redness to right chest wall. Continues to use Radiplex and aloe.  Breast Tenderness: yes Breast Swelling: no Lymphadema: no Range of Motion limitations: Patient has full range of motion. Reports having some cording to right axilla.  Fatigue post radiation: Continues to have a low energy level.  Appetite good/fair/poor: Good   Additional comments if applicable:  Patient reports having mid back pain to right side that is worse in the mornings.  Per patient makes it difficult to breath. Patient states pain improves throughout the day.   Vitals:   08/26/21 1543  BP: 118/73  Pulse: 91  Resp: 20  Temp: 97.7 F (36.5 C)  SpO2: 100%  Weight: 157 lb (71.2 kg)  Height: 5\' 6"  (1.676 m)

## 2021-08-28 ENCOUNTER — Telehealth: Payer: Self-pay

## 2021-08-28 NOTE — Telephone Encounter (Signed)
Returned pt call, we discussed what a survivorship appointment would be for.  Pt verbalized understanding and thanks.

## 2021-10-24 ENCOUNTER — Encounter: Payer: 59 | Admitting: Adult Health

## 2021-10-24 ENCOUNTER — Other Ambulatory Visit: Payer: Self-pay | Admitting: *Deleted

## 2021-10-24 MED ORDER — ANASTROZOLE 1 MG PO TABS: 1.0000 mg | ORAL_TABLET | Freq: Every evening | ORAL | 3 refills | Status: DC

## 2022-04-08 ENCOUNTER — Other Ambulatory Visit: Payer: Self-pay | Admitting: Endocrinology

## 2022-04-08 DIAGNOSIS — E049 Nontoxic goiter, unspecified: Secondary | ICD-10-CM

## 2022-06-03 ENCOUNTER — Ambulatory Visit
Admission: RE | Admit: 2022-06-03 | Discharge: 2022-06-03 | Disposition: A | Discharge status: Home / Self Care | Payer: 59 | Source: Ambulatory Visit | Attending: Endocrinology | Admitting: Endocrinology

## 2022-06-03 DIAGNOSIS — E049 Nontoxic goiter, unspecified: Secondary | ICD-10-CM

## 2022-07-20 NOTE — Progress Notes (Signed)
Patient Care Team: Kristen Loader, FNP as PCP - General (Family Medicine) Mauro Kaufmann, RN as Oncology Nurse Navigator Rockwell Germany, RN as Oncology Nurse Navigator  DIAGNOSIS: No diagnosis found.  SUMMARY OF ONCOLOGIC HISTORY: Oncology History  Malignant neoplasm of lower-inner quadrant of right breast of female, estrogen receptor positive ([place])  10/27/2019 Cancer Staging   Staging form: Breast, AJCC 8th Edition - Clinical stage from 10/27/2019: Stage IA (cT1c, cN0, cM0, G2, ER+, PR+, HER2-) - Signed by Gardenia Phlegm, NP on 11/02/2019   11/02/2019 Initial Diagnosis   Screening mammogram detected a right breast distortion. Diagnostic mammogram showed a 1.8cm right breast mass, 4:00 position, with abnormal tissue surrounding the mass measuring a total of 4.9cm, no right axillary adenopathy. Biopsy showed invasive mammary carcinoma with lobular features, grade 2, HER-2 - (1+), ER+ 95%, PR+ 2%, KI67 5%.    10/2019 -  Anti-estrogen oral therapy   Anastrozole daily   04/25/2021 Surgery   Right mastectomy: Grade 2 ILC 5.2 cm, 0/3 lymph nodes negative, ER 95%, PR 2%, HER2 equal vocal, FISH negative, Ki-67 1%, RCB class II   04/25/2021 Cancer Staging   Staging form: Breast, AJCC 8th Edition - Pathologic stage from 04/25/2021: No Stage Recommended (ypT3, pN0, cM0, G2, ER+, PR-, HER2-) - Signed by Gardenia Phlegm, NP on 10/15/2021 Stage prefix: Post-therapy Histologic grading system: 3 grade system   05/15/2021 Oncotype testing   Oncotype DX: Score: 16, distant recurrence at 9 years: 4%     CHIEF COMPLIANT:   INTERVAL HISTORY: Margarit Minshall is a   ALLERGIES:  is allergic to iodine.  MEDICATIONS:  Current Outpatient Medications  Medication Sig Dispense Refill   ALPRAZolam (XANAX) 0.5 MG tablet Take 0.5 mg by mouth 2 (two) times daily as needed for anxiety.     anastrozole (ARIMIDEX) 1 MG tablet Take 1 tablet (1 mg total) by mouth at bedtime. At 8 pm 90 tablet 3    cyclobenzaprine (FLEXERIL) 10 MG tablet Take 10 mg by mouth daily as needed for muscle spasms.     ferrous sulfate 325 (65 FE) MG tablet Take 325 mg by mouth daily with breakfast.     ibuprofen (ADVIL,MOTRIN) 200 MG tablet Take 600 mg by mouth every 6 (six) hours as needed for mild pain.     levocetirizine (XYZAL) 5 MG tablet Take 5 mg by mouth daily as needed for allergies.     Levothyroxine Sodium (LEVOTHROID PO) Take 50 mcg by mouth daily.     metroNIDAZOLE (METROCREAM) 0.75 % cream Apply 1 application topically 2 (two) times daily as needed (facial rash).     Multiple Vitamin (MULTIVITAMIN) capsule Take 1 capsule by mouth daily.     NON FORMULARY Take 1 tablet by mouth daily. Bio-dent for bone health     Omega-3 1000 MG CAPS Take 1,000 mg by mouth daily.     VITAMIN D PO Take 1 tablet by mouth once a week. (Patient not taking: Reported on 08/26/2021)     No current facility-administered medications for this visit.    PHYSICAL EXAMINATION: ECOG PERFORMANCE STATUS: {CHL ONC ECOG PS:[id number]}  There were no vitals filed for this visit. There were no vitals filed for this visit.  BREAST:*** No palpable masses or nodules in either right or left breasts. No palpable axillary supraclavicular or infraclavicular adenopathy no breast tenderness or nipple discharge. (exam performed in the presence of a chaperone)  LABORATORY DATA:  I have reviewed the data as listed  Latest Ref Rng & Units 04/23/2021    2:10 PM 04/24/2009    2:09 PM  CMP  Glucose 70 - 99 mg/dL 99  85   BUN 8 - 23 mg/dL 13  16   Creatinine 0.44 - 1.00 mg/dL 0.80  1.0   Sodium 135 - 145 mmol/L 137  144   Potassium 3.5 - 5.1 mmol/L 4.1  4.6   Chloride 98 - 111 mmol/L 103  106   CO2 22 - 32 mmol/L 28  31   Calcium 8.9 - 10.3 mg/dL 10.5  9.4     Lab Results  Component Value Date   WBC 6.1 04/23/2021   HGB 12.5 04/23/2021   HCT 38.3 04/23/2021   MCV 94.6 04/23/2021   PLT 287 04/23/2021   NEUTROABS 4.2 05/14/2009     ASSESSMENT & PLAN:  No problem-specific Assessment & Plan notes found for this encounter.    No orders of the defined types were placed in this encounter.  The patient has a good understanding of the overall plan. she agrees with it. she will call with any problems that may develop before the next visit here. Total time spent: 30 mins including face to face time and time spent for planning, charting and co-ordination of care   Suzzette Righter, [place] 07/20/22    I Gardiner Coins am scribing for Dr. Lindi Adie  ***

## 2022-07-23 ENCOUNTER — Inpatient Hospital Stay: Payer: 59 | Attending: Hematology and Oncology | Admitting: Hematology and Oncology

## 2022-07-23 ENCOUNTER — Other Ambulatory Visit: Payer: Self-pay

## 2022-07-23 VITALS — BP 152/77 | HR 85 | Temp 97.7°F | Resp 18 | Ht 66.0 in | Wt 159.9 lb

## 2022-07-23 DIAGNOSIS — Z170 Estrogen receptor positive status [ER+]: Secondary | ICD-10-CM | POA: Insufficient documentation

## 2022-07-23 DIAGNOSIS — Z923 Personal history of irradiation: Secondary | ICD-10-CM | POA: Diagnosis not present

## 2022-07-23 DIAGNOSIS — R635 Abnormal weight gain: Secondary | ICD-10-CM | POA: Diagnosis not present

## 2022-07-23 DIAGNOSIS — C50311 Malignant neoplasm of lower-inner quadrant of right female breast: Secondary | ICD-10-CM | POA: Diagnosis not present

## 2022-07-23 DIAGNOSIS — Z79811 Long term (current) use of aromatase inhibitors: Secondary | ICD-10-CM | POA: Diagnosis not present

## 2022-07-23 DIAGNOSIS — R232 Flushing: Secondary | ICD-10-CM | POA: Diagnosis not present

## 2022-07-23 DIAGNOSIS — Z9011 Acquired absence of right breast and nipple: Secondary | ICD-10-CM | POA: Insufficient documentation

## 2022-07-23 NOTE — Assessment & Plan Note (Addendum)
11/02/2019:Screening mammogram detected a right breast distortion. Diagnostic mammogram showed a 1.8cm right breast mass, 4:00 position, with abnormal tissue surrounding the mass measuring a total of 4.9cm, no right axillary adenopathy. Biopsy showed invasive mammary carcinoma with lobular features, grade 2, HER-2 - (1+), ER+ 95%, PR+ 2%, KI67 5%.  January 2021: Neoadjuvant antiestrogen therapy with anastrozole  04/25/2021:Right mastectomy: Grade 2 ILC 5.2 cm, 0/3 lymph nodes negative, ER 95%, PR 2%, HER2 equal vocal, FISH negative, Ki-67 1%, RCB class II  Oncotype DX: Score: 16, distant recurrence at 9 years: 4% Adjuvant radiation: 06/14/2021-07/26/2021  Treatment plan: Adjuvant antiestrogen therapy with Anastrozole 2.5 mg daily x7 years  Anastrozole toxicities: Mild hot flashes.   Breast cancer surveillance: 1.  Breast exam 07/23/2022: Benign 2. mammogram on the left breast will need to be scheduled.  I sent an order for the breast center.  Return to clinic in 1 year for follow-up

## 2022-07-24 ENCOUNTER — Telehealth: Payer: Self-pay | Admitting: Hematology and Oncology

## 2022-07-24 NOTE — Telephone Encounter (Signed)
Scheduled appointment per 10/4 los. Patient is aware.

## 2022-08-04 ENCOUNTER — Other Ambulatory Visit: Payer: Self-pay | Admitting: Family Medicine

## 2022-08-04 DIAGNOSIS — M810 Age-related osteoporosis without current pathological fracture: Secondary | ICD-10-CM

## 2022-09-01 ENCOUNTER — Ambulatory Visit
Admission: RE | Admit: 2022-09-01 | Discharge: 2022-09-01 | Disposition: A | Discharge status: Home / Self Care | Payer: 59 | Source: Ambulatory Visit | Attending: Hematology and Oncology | Admitting: Hematology and Oncology

## 2022-09-01 ENCOUNTER — Other Ambulatory Visit: Payer: Self-pay | Admitting: Hematology and Oncology

## 2022-09-01 DIAGNOSIS — Z1231 Encounter for screening mammogram for malignant neoplasm of breast: Secondary | ICD-10-CM

## 2022-09-01 DIAGNOSIS — C50311 Malignant neoplasm of lower-inner quadrant of right female breast: Secondary | ICD-10-CM

## 2022-10-23 ENCOUNTER — Other Ambulatory Visit: Payer: Self-pay

## 2022-10-23 MED ORDER — ANASTROZOLE 1 MG PO TABS: 1.0000 mg | ORAL_TABLET | Freq: Every evening | ORAL | 3 refills | Status: DC

## 2023-01-13 ENCOUNTER — Ambulatory Visit
Admission: RE | Admit: 2023-01-13 | Discharge: 2023-01-13 | Disposition: A | Discharge status: Home / Self Care | Payer: 59 | Source: Ambulatory Visit | Attending: Family Medicine | Admitting: Family Medicine

## 2023-01-13 DIAGNOSIS — M810 Age-related osteoporosis without current pathological fracture: Secondary | ICD-10-CM

## 2023-07-21 ENCOUNTER — Other Ambulatory Visit: Payer: Self-pay | Admitting: Hematology and Oncology

## 2023-07-21 DIAGNOSIS — Z1231 Encounter for screening mammogram for malignant neoplasm of breast: Secondary | ICD-10-CM

## 2023-07-27 ENCOUNTER — Inpatient Hospital Stay: Payer: 59 | Attending: Hematology and Oncology | Admitting: Hematology and Oncology

## 2023-07-27 VITALS — BP 140/73 | HR 84 | Temp 98.8°F | Resp 17 | Wt 172.3 lb

## 2023-07-27 DIAGNOSIS — Z79811 Long term (current) use of aromatase inhibitors: Secondary | ICD-10-CM | POA: Diagnosis not present

## 2023-07-27 DIAGNOSIS — Z170 Estrogen receptor positive status [ER+]: Secondary | ICD-10-CM | POA: Diagnosis not present

## 2023-07-27 DIAGNOSIS — C50311 Malignant neoplasm of lower-inner quadrant of right female breast: Secondary | ICD-10-CM | POA: Insufficient documentation

## 2023-07-27 NOTE — Assessment & Plan Note (Signed)
11/02/2019:Screening mammogram detected a right breast distortion. Diagnostic mammogram showed a 1.8cm right breast mass, 4:00 position, with abnormal tissue surrounding the mass measuring a total of 4.9cm, no right axillary adenopathy. Biopsy showed invasive mammary carcinoma with lobular features, grade 2, HER-2 - (1+), ER+ 95%, PR+ 2%, KI67 5%.    January 2021: Neoadjuvant antiestrogen therapy with anastrozole   04/25/2021:Right mastectomy: Grade 2 ILC 5.2 cm, 0/3 lymph nodes negative, ER 95%, PR 2%, HER2 equal vocal, FISH negative, Ki-67 1%, RCB class II   Oncotype DX: Score: 16, distant recurrence at 9 years: 4% Adjuvant radiation: 06/14/2021-07/26/2021   Treatment plan: Adjuvant antiestrogen therapy with Anastrozole 2.5 mg daily x7 years   Anastrozole toxicities: Mild hot flashes.    Breast cancer surveillance: 1.  Breast exam 07/27/23: Benign 2. mammogram on the left breast 09/03/22.Density Cat C.   Return to clinic in 1 year for follow-up

## 2023-07-27 NOTE — Progress Notes (Signed)
Patient Care Team: Soundra Pilon, FNP as PCP - General (Family Medicine) Pershing Proud, RN as Oncology Nurse Navigator Donnelly Angelica, RN as Oncology Nurse Navigator  DIAGNOSIS:  Encounter Diagnosis  Name Primary?   Malignant neoplasm of lower-inner quadrant of right breast of female, estrogen receptor positive ([facility]) Yes    SUMMARY OF ONCOLOGIC HISTORY: Oncology History  Malignant neoplasm of lower-inner quadrant of right breast of female, estrogen receptor positive ([facility])  10/27/2019 Cancer Staging   Staging form: Breast, AJCC 8th Edition - Clinical stage from 10/27/2019: Stage IA (cT1c, cN0, cM0, G2, ER+, PR+, HER2-) - Signed by Loa Socks, NP on 11/02/2019   11/02/2019 Initial Diagnosis   Screening mammogram detected a right breast distortion. Diagnostic mammogram showed a 1.8cm right breast mass, 4:00 position, with abnormal tissue surrounding the mass measuring a total of 4.9cm, no right axillary adenopathy. Biopsy showed invasive mammary carcinoma with lobular features, grade 2, HER-2 - (1+), ER+ 95%, PR+ 2%, KI67 5%.    10/2019 -  Anti-estrogen oral therapy   Anastrozole daily   04/25/2021 Surgery   Right mastectomy: Grade 2 ILC 5.2 cm, 0/3 lymph nodes negative, ER 95%, PR 2%, HER2 equal vocal, FISH negative, Ki-67 1%, RCB class II   04/25/2021 Cancer Staging   Staging form: Breast, AJCC 8th Edition - Pathologic stage from 04/25/2021: No Stage Recommended (ypT3, pN0, cM0, G2, ER+, PR-, HER2-) - Signed by Loa Socks, NP on 10/15/2021 Stage prefix: Post-therapy Histologic grading system: 3 grade system   05/15/2021 Oncotype testing   Oncotype DX: Score: 16, distant recurrence at 9 years: 4%     CHIEF COMPLIANT: Follow-up on anastrozole therapy  History of Present Illness   Sydney Moody, a patient with a history of breast cancer, presents for a routine follow-up. She has been on anastrozole for approximately two and a half years, which was initiated  after a year of another medication. She reports weight gain, which she attributes to increased appetite and decreased willpower. She also experiences symptoms associated with estrogen deficiency, including thin, easily torn skin, particularly in the genital area. She has managed these symptoms with the use of a bidet. Occasionally, she experiences chest discomfort and pain in certain spots on her ribs, but these do not cause significant distress.  In addition to her breast cancer treatment, D[place] has been diagnosed with osteoporosis. Her bone density, measured in March, was significantly low, with a score of -3. She has been considering treatment options for this condition, but expresses concern about potential side effects and the impact on her bone growth. She has been taking biotin, which previously improved her bone density from osteoporosis to osteopenia, but her latest results show a return to osteoporosis.         ALLERGIES:  is allergic to iodine.  MEDICATIONS:  Current Outpatient Medications  Medication Sig Dispense Refill   ALPRAZolam (XANAX) 0.5 MG tablet Take 0.5 mg by mouth 2 (two) times daily as needed for anxiety.     anastrozole (ARIMIDEX) 1 MG tablet Take 1 tablet (1 mg total) by mouth at bedtime. At 8 pm 90 tablet 3   cyclobenzaprine (FLEXERIL) 10 MG tablet Take 10 mg by mouth daily as needed for muscle spasms.     Cyclobenzaprine HCl (FLEXERIL PO) Flexeril     ferrous sulfate 325 (65 FE) MG tablet Take 325 mg by mouth daily with breakfast.     ibuprofen (ADVIL,MOTRIN) 200 MG tablet Take 600 mg by mouth every  6 (six) hours as needed for mild pain.     levocetirizine (XYZAL) 5 MG tablet Take 5 mg by mouth daily as needed for allergies.     metroNIDAZOLE (METROCREAM) 0.75 % cream Apply 1 application topically 2 (two) times daily as needed (facial rash).     Multiple Vitamins-Minerals (SUPPORT-500 PO) Support     NON FORMULARY Take 1 tablet by mouth daily. Bio-dent for bone  health     Omega-3 1000 MG CAPS 2 tablets once a day     SYNTHROID 50 MCG tablet Take 50 mcg by mouth daily.     VITAMIN D PO Take 1 tablet by mouth once a week.     No current facility-administered medications for this visit.    PHYSICAL EXAMINATION: ECOG PERFORMANCE STATUS: 1 - Symptomatic but completely ambulatory  Vitals:   07/27/23 1527  BP: (!) 140/73  Pulse: 84  Resp: 17  Temp: 98.8 F (37.1 C)  SpO2: 96%   Filed Weights   07/27/23 1527  Weight: 172 lb 4.8 oz (78.2 kg)    LABORATORY DATA:  I have reviewed the data as listed    Latest Ref Rng & Units 04/23/2021    2:10 PM 04/24/2009    2:09 PM  CMP  Glucose 70 - 99 mg/dL 99  85   BUN 8 - 23 mg/dL 13  16   Creatinine 8.41 - 1.00 mg/dL 3.24  1.0   Sodium 401 - 145 mmol/L 137  144   Potassium 3.5 - 5.1 mmol/L 4.1  4.6   Chloride 98 - 111 mmol/L 103  106   CO2 22 - 32 mmol/L 28  31   Calcium 8.9 - 10.3 mg/dL 02.7  9.4     Lab Results  Component Value Date   WBC 6.1 04/23/2021   HGB 12.5 04/23/2021   HCT 38.3 04/23/2021   MCV 94.6 04/23/2021   PLT 287 04/23/2021   NEUTROABS 4.2 05/14/2009    ASSESSMENT & PLAN:  Malignant neoplasm of lower-inner quadrant of right breast of female, estrogen receptor positive ([facility]) 11/02/2019:Screening mammogram detected a right breast distortion. Diagnostic mammogram showed a 1.8cm right breast mass, 4:00 position, with abnormal tissue surrounding the mass measuring a total of 4.9cm, no right axillary adenopathy. Biopsy showed invasive mammary carcinoma with lobular features, grade 2, HER-2 - (1+), ER+ 95%, PR+ 2%, KI67 5%.    January 2021: Neoadjuvant antiestrogen therapy with anastrozole   04/25/2021:Right mastectomy: Grade 2 ILC 5.2 cm, 0/3 lymph nodes negative, ER 95%, PR 2%, HER2 equal vocal, FISH negative, Ki-67 1%, RCB class II   Oncotype DX: Score: 16, distant recurrence at 9 years: 4% Adjuvant radiation: 06/14/2021-07/26/2021   Treatment plan: Adjuvant antiestrogen  therapy with Anastrozole 2.5 mg daily x7 years   Anastrozole toxicities: Mild hot flashes.    Breast cancer surveillance: 1.  Breast exam 07/27/23: Benign 2. mammogram on the left breast 09/03/22.Density Cat C.       Breast Cancer On Anastrozole since March 2021. Reports weight gain, skin thinning, and dryness, which are expected side effects due to estrogen suppression. No significant chest pain or discomfort. -Continue Anastrozole. -Schedule mammogram on September 04, 2023.  Osteoporosis Bone density scan in March 2024 showed significant osteoporosis (T-score -3.0). Patient is hesitant to start Fosamax due to concerns about bone growth. Discussed options for bone density management, including Zometa (annual infusion) and Prolia (biannual injection), both of which suppress osteoclast activity to favor bone building. -Research Zometa and Prolia for  potential use. -Plan for next bone density scan in March 2026.  Follow-up in 1 year.       No orders of the defined types were placed in this encounter.  The patient has a good understanding of the overall plan. she agrees with it. she will call with any problems that may develop before the next visit here. Total time spent: 30 mins including face to face time and time spent for planning, charting and co-ordination of care   Tamsen Meek, MD 07/27/23

## 2023-09-04 ENCOUNTER — Ambulatory Visit
Admission: RE | Admit: 2023-09-04 | Discharge: 2023-09-04 | Disposition: A | Discharge status: Home / Self Care | Payer: 59 | Source: Ambulatory Visit | Attending: Hematology and Oncology | Admitting: Hematology and Oncology

## 2023-09-04 DIAGNOSIS — Z1231 Encounter for screening mammogram for malignant neoplasm of breast: Secondary | ICD-10-CM

## 2023-09-04 HISTORY — DX: Personal history of irradiation: Z92.3

## 2023-10-23 ENCOUNTER — Other Ambulatory Visit: Payer: Self-pay | Admitting: *Deleted

## 2023-10-23 MED ORDER — ANASTROZOLE 1 MG PO TABS: 1.0000 mg | ORAL_TABLET | Freq: Every evening | ORAL | 3 refills | Status: DC

## 2024-06-09 ENCOUNTER — Other Ambulatory Visit: Payer: Self-pay | Admitting: Endocrinology

## 2024-06-09 DIAGNOSIS — E049 Nontoxic goiter, unspecified: Secondary | ICD-10-CM

## 2024-06-22 ENCOUNTER — Ambulatory Visit
Admission: RE | Admit: 2024-06-22 | Discharge: 2024-06-22 | Disposition: A | Discharge status: Home / Self Care | Source: Ambulatory Visit | Attending: Endocrinology | Admitting: Endocrinology

## 2024-06-22 DIAGNOSIS — E049 Nontoxic goiter, unspecified: Secondary | ICD-10-CM

## 2024-07-25 ENCOUNTER — Other Ambulatory Visit: Payer: Self-pay | Admitting: Family Medicine

## 2024-07-25 DIAGNOSIS — Z1231 Encounter for screening mammogram for malignant neoplasm of breast: Secondary | ICD-10-CM

## 2024-07-28 ENCOUNTER — Inpatient Hospital Stay: Payer: 59 | Attending: Hematology and Oncology | Admitting: Hematology and Oncology

## 2024-07-28 VITALS — BP 148/84 | HR 76 | Temp 97.3°F | Resp 16 | Wt 170.9 lb

## 2024-07-28 DIAGNOSIS — Z79811 Long term (current) use of aromatase inhibitors: Secondary | ICD-10-CM | POA: Insufficient documentation

## 2024-07-28 DIAGNOSIS — Z923 Personal history of irradiation: Secondary | ICD-10-CM | POA: Insufficient documentation

## 2024-07-28 DIAGNOSIS — Z170 Estrogen receptor positive status [ER+]: Secondary | ICD-10-CM | POA: Diagnosis not present

## 2024-07-28 DIAGNOSIS — R232 Flushing: Secondary | ICD-10-CM | POA: Insufficient documentation

## 2024-07-28 DIAGNOSIS — C50311 Malignant neoplasm of lower-inner quadrant of right female breast: Secondary | ICD-10-CM | POA: Insufficient documentation

## 2024-07-28 DIAGNOSIS — Z9011 Acquired absence of right breast and nipple: Secondary | ICD-10-CM | POA: Insufficient documentation

## 2024-07-28 MED ORDER — ESTRADIOL 0.01 % VA CREA: 1.0000 | TOPICAL_CREAM | VAGINAL | 12 refills | Status: AC

## 2024-07-28 NOTE — Progress Notes (Signed)
 Patient Care Team: Marvene Prentice SAUNDERS, FNP as PCP - General (Family Medicine) Tyree Nanetta SAILOR, RN as Oncology Nurse Navigator  DIAGNOSIS:  Encounter Diagnosis  Name Primary?   Malignant neoplasm of lower-inner quadrant of right breast of female, estrogen receptor positive ([facility]) Yes    SUMMARY OF ONCOLOGIC HISTORY: Oncology History  Malignant neoplasm of lower-inner quadrant of right breast of female, estrogen receptor positive ([facility])  10/27/2019 Cancer Staging   Staging form: Breast, AJCC 8th Edition - Clinical stage from 10/27/2019: Stage IA (cT1c, cN0, cM0, G2, ER+, PR+, HER2-) - Signed by Crawford Morna Pickle, NP on 11/02/2019   11/02/2019 Initial Diagnosis   Screening mammogram detected a right breast distortion. Diagnostic mammogram showed a 1.8cm right breast mass, 4:00 position, with abnormal tissue surrounding the mass measuring a total of 4.9cm, no right axillary adenopathy. Biopsy showed invasive mammary carcinoma with lobular features, grade 2, HER-2 - (1+), ER+ 95%, PR+ 2%, KI67 5%.    10/2019 -  Anti-estrogen oral therapy   Anastrozole  daily   04/25/2021 Surgery   Right mastectomy: Grade 2 ILC 5.2 cm, 0/3 lymph nodes negative, ER 95%, PR 2%, HER2 equal vocal, FISH negative, Ki-67 1%, RCB class II   04/25/2021 Cancer Staging   Staging form: Breast, AJCC 8th Edition - Pathologic stage from 04/25/2021: No Stage Recommended (ypT3, pN0, cM0, G2, ER+, PR-, HER2-) - Signed by Crawford Morna Pickle, NP on 10/15/2021 Stage prefix: Post-therapy Histologic grading system: 3 grade system   05/15/2021 Oncotype testing   Oncotype DX: Score: 16, distant recurrence at 9 years: 4%     CHIEF COMPLIANT: Surveillance of breast cancer on antiestrogen therapy  HISTORY OF PRESENT ILLNESS: Sydney Moody is a 68 year old with above-mentioned history of breast cancer who underwent mastectomy and has been on anastrozole  therapy.  She is tolerating anastrozole  fairly well.  She has completed 5 years of  treatment.  She has 2 more years left.  She reports mild hot flashes but otherwise no issues.  She has had loss of bone density with a T-score of -3 and reluctant to consider bisphosphonate therapy.     ALLERGIES:  is allergic to iodine.  MEDICATIONS:  Current Outpatient Medications  Medication Sig Dispense Refill   ALPRAZolam  (XANAX ) 0.5 MG tablet Take 0.5 mg by mouth 2 (two) times daily as needed for anxiety.     anastrozole  (ARIMIDEX ) 1 MG tablet Take 1 tablet (1 mg total) by mouth at bedtime. At 8 pm 90 tablet 3   cyclobenzaprine  (FLEXERIL ) 10 MG tablet Take 10 mg by mouth daily as needed for muscle spasms.     Cyclobenzaprine  HCl (FLEXERIL  PO) Flexeril      [START ON 07/29/2024] estradiol (ESTRACE) 0.01 % CREA vaginal cream Place 1 Applicatorful vaginally 3 (three) times a week. 42.5 g 12   ferrous sulfate 325 (65 FE) MG tablet Take 325 mg by mouth daily with breakfast.     ibuprofen (ADVIL,MOTRIN) 200 MG tablet Take 600 mg by mouth every 6 (six) hours as needed for mild pain.     levocetirizine (XYZAL ) 5 MG tablet Take 5 mg by mouth daily as needed for allergies.     metroNIDAZOLE (METROCREAM) 0.75 % cream Apply 1 application topically 2 (two) times daily as needed (facial rash).     Multiple Vitamins-Minerals (SUPPORT-500 PO) Support     NON FORMULARY Take 1 tablet by mouth daily. Bio-dent for bone health     Omega-3 1000 MG CAPS 2 tablets once a day  SYNTHROID  50 MCG tablet Take 50 mcg by mouth daily.     VITAMIN D PO Take 1 tablet by mouth once a week.     No current facility-administered medications for this visit.    PHYSICAL EXAMINATION: ECOG PERFORMANCE STATUS: 1 - Symptomatic but completely ambulatory  Vitals:   07/28/24 1531 07/28/24 1538  BP: (!) 142/57 (!) 148/84  Pulse: 76   Resp: 16   Temp: (!) 97.3 F (36.3 C)   SpO2: 99%    Filed Weights   07/28/24 1531  Weight: 170 lb 14.4 oz (77.5 kg)    Physical Exam No palpable lumps or nodules  (exam performed  in the presence of a chaperone)  LABORATORY DATA:  I have reviewed the data as listed    Latest Ref Rng & Units 04/23/2021    2:10 PM 04/24/2009    2:09 PM  CMP  Glucose 70 - 99 mg/dL 99  85   BUN 8 - 23 mg/dL 13  16   Creatinine 9.55 - 1.00 mg/dL 9.19  1.0   Sodium 864 - 145 mmol/L 137  144   Potassium 3.5 - 5.1 mmol/L 4.1  4.6   Chloride 98 - 111 mmol/L 103  106   CO2 22 - 32 mmol/L 28  31   Calcium 8.9 - 10.3 mg/dL 89.4  9.4     Lab Results  Component Value Date   WBC 6.1 04/23/2021   HGB 12.5 04/23/2021   HCT 38.3 04/23/2021   MCV 94.6 04/23/2021   PLT 287 04/23/2021   NEUTROABS 4.2 05/14/2009    ASSESSMENT & PLAN:  Malignant neoplasm of lower-inner quadrant of right breast of female, estrogen receptor positive ([facility]) 11/02/2019:Screening mammogram detected a right breast distortion. Diagnostic mammogram showed a 1.8cm right breast mass, 4:00 position, with abnormal tissue surrounding the mass measuring a total of 4.9cm, no right axillary adenopathy. Biopsy showed invasive mammary carcinoma with lobular features, grade 2, HER-2 - (1+), ER+ 95%, PR+ 2%, KI67 5%.    January 2021: Neoadjuvant antiestrogen therapy with anastrozole    04/25/2021:Right mastectomy: Grade 2 ILC 5.2 cm, 0/3 lymph nodes negative, ER 95%, PR 2%, HER2 equal vocal, FISH negative, Ki-67 1%, RCB class II   Oncotype DX: Score: 16, distant recurrence at 9 years: 4% Adjuvant radiation: 06/14/2021-07/26/2021   Treatment plan: Adjuvant antiestrogen therapy with Anastrozole  2.5 mg daily x7 years   Anastrozole  toxicities: Mild hot flashes.    Breast cancer surveillance: 1.  Breast exam 07/28/2024: Benign 2. mammogram on the left breast 09/08/2023.Density Cat C. Bone density 01/13/2023: T-score -3: I recommended bisphosphonate therapy with calcium and vitamin D.  She will think about Prolia or Zometa.  She does not want to take any oral bisphosphonates.  Return to clinic in 1 year for follow-up     No orders of  the defined types were placed in this encounter.  The patient has a good understanding of the overall plan. she agrees with it. she will call with any problems that may develop before the next visit here.  I personally spent a total of 30 minutes in the care of the patient today including preparing to see the patient, getting/reviewing separately obtained history, performing a medically appropriate exam/evaluation, counseling and educating, placing orders, referring and communicating with other health care professionals, documenting clinical information in the EHR, independently interpreting results, communicating results, and coordinating care.   Viinay K Vaneza Pickart, MD 07/28/24

## 2024-07-28 NOTE — Assessment & Plan Note (Signed)
 11/02/2019:Screening mammogram detected a right breast distortion. Diagnostic mammogram showed a 1.8cm right breast mass, 4:00 position, with abnormal tissue surrounding the mass measuring a total of 4.9cm, no right axillary adenopathy. Biopsy showed invasive mammary carcinoma with lobular features, grade 2, HER-2 - (1+), ER+ 95%, PR+ 2%, KI67 5%.    January 2021: Neoadjuvant antiestrogen therapy with anastrozole    04/25/2021:Right mastectomy: Grade 2 ILC 5.2 cm, 0/3 lymph nodes negative, ER 95%, PR 2%, HER2 equal vocal, FISH negative, Ki-67 1%, RCB class II   Oncotype DX: Score: 16, distant recurrence at 9 years: 4% Adjuvant radiation: 06/14/2021-07/26/2021   Treatment plan: Adjuvant antiestrogen therapy with Anastrozole  2.5 mg daily x7 years   Anastrozole  toxicities: Mild hot flashes.    Breast cancer surveillance: 1.  Breast exam 07/28/2024: Benign 2. mammogram on the left breast 09/08/2023.Density Cat C.   Return to clinic in 1 year for follow-up

## 2024-09-05 ENCOUNTER — Ambulatory Visit
Admission: RE | Admit: 2024-09-05 | Discharge: 2024-09-05 | Disposition: A | Source: Ambulatory Visit | Attending: Family Medicine | Admitting: Family Medicine

## 2024-09-05 DIAGNOSIS — Z1231 Encounter for screening mammogram for malignant neoplasm of breast: Secondary | ICD-10-CM

## 2024-10-07 ENCOUNTER — Other Ambulatory Visit: Payer: Self-pay | Admitting: Hematology and Oncology

## 2024-11-18 ENCOUNTER — Other Ambulatory Visit: Payer: Self-pay | Admitting: Endocrinology

## 2024-11-18 DIAGNOSIS — E210 Primary hyperparathyroidism: Secondary | ICD-10-CM

## 2024-12-01 ENCOUNTER — Other Ambulatory Visit (HOSPITAL_COMMUNITY)

## 2024-12-01 ENCOUNTER — Ambulatory Visit (HOSPITAL_COMMUNITY)

## 2025-07-27 ENCOUNTER — Ambulatory Visit: Admitting: Hematology and Oncology
